# Patient Record
Sex: Female | Born: 1995 | Race: Black or African American | Hispanic: No | Marital: Single | Smoking: Light tobacco smoker
Health system: Southern US, Community
[De-identification: ages and names within clinical notes are randomized; demographics above are authoritative.]

## PROBLEM LIST (undated history)

## (undated) DIAGNOSIS — N200 Calculus of kidney: Secondary | ICD-10-CM

## (undated) DIAGNOSIS — R55 Syncope and collapse: Secondary | ICD-10-CM

## (undated) DIAGNOSIS — Z3046 Encounter for surveillance of implantable subdermal contraceptive: Secondary | ICD-10-CM

## (undated) HISTORY — DX: Calculus of kidney: N20.0

## (undated) HISTORY — DX: Syncope and collapse: R55

## (undated) HISTORY — DX: Encounter for surveillance of implantable subdermal contraceptive: Z30.46

---
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
## 2014-01-05 ENCOUNTER — Emergency Department (HOSPITAL_COMMUNITY): Payer: BC Managed Care – PPO

## 2014-01-05 ENCOUNTER — Emergency Department (HOSPITAL_COMMUNITY)
Admission: EM | Admit: 2014-01-05 | Discharge: 2014-01-05 | Disposition: A | Discharge status: Home / Self Care | Payer: BC Managed Care – PPO | Attending: Emergency Medicine | Admitting: Emergency Medicine

## 2014-01-05 ENCOUNTER — Encounter (HOSPITAL_COMMUNITY): Payer: Self-pay | Admitting: Emergency Medicine

## 2014-01-05 DIAGNOSIS — K5900 Constipation, unspecified: Secondary | ICD-10-CM | POA: Diagnosis not present

## 2014-01-05 DIAGNOSIS — Z79899 Other long term (current) drug therapy: Secondary | ICD-10-CM | POA: Diagnosis not present

## 2014-01-05 DIAGNOSIS — J45909 Unspecified asthma, uncomplicated: Secondary | ICD-10-CM | POA: Insufficient documentation

## 2014-01-05 DIAGNOSIS — Z7952 Long term (current) use of systemic steroids: Secondary | ICD-10-CM | POA: Diagnosis not present

## 2014-01-05 DIAGNOSIS — Z8739 Personal history of other diseases of the musculoskeletal system and connective tissue: Secondary | ICD-10-CM | POA: Diagnosis not present

## 2014-01-05 NOTE — Discharge Instructions (Signed)
Magnesium citrate: Drink entire 10 ounce bottle mixed with equal parts Sprite or Gatorade.  Fleets enemas as directed as needed.  Return to the emergency department for severe abdominal pain, bloody stool, high fever, or other new and concerning symptoms.   Constipation Constipation is when a person has fewer than three bowel movements a week, has difficulty having a bowel movement, or has stools that are dry, hard, or larger than normal. As people grow older, constipation is more common. If you try to fix constipation with medicines that make you have a bowel movement (laxatives), the problem may get worse. Long-term laxative use may cause the muscles of the colon to become weak. A low-fiber diet, not taking in enough fluids, and taking certain medicines may make constipation worse.  CAUSES   Certain medicines, such as antidepressants, pain medicine, iron supplements, antacids, and water pills.   Certain diseases, such as diabetes, irritable bowel syndrome (IBS), thyroid disease, or depression.   Not drinking enough water.   Not eating enough fiber-rich foods.   Stress or travel.   Lack of physical activity or exercise.   Ignoring the urge to have a bowel movement.   Using laxatives too much.  SIGNS AND SYMPTOMS   Having fewer than three bowel movements a week.   Straining to have a bowel movement.   Having stools that are hard, dry, or larger than normal.   Feeling full or bloated.   Pain in the lower abdomen.   Not feeling relief after having a bowel movement.  DIAGNOSIS  Your health care provider will take a medical history and perform a physical exam. Further testing may be done for severe constipation. Some tests may include:  A barium enema X-ray to examine your rectum, colon, and, sometimes, your small intestine.   A sigmoidoscopy to examine your lower colon.   A colonoscopy to examine your entire colon. TREATMENT  Treatment will depend on the  severity of your constipation and what is causing it. Some dietary treatments include drinking more fluids and eating more fiber-rich foods. Lifestyle treatments may include regular exercise. If these diet and lifestyle recommendations do not help, your health care provider may recommend taking over-the-counter laxative medicines to help you have bowel movements. Prescription medicines may be prescribed if over-the-counter medicines do not work.  HOME CARE INSTRUCTIONS   Eat foods that have a lot of fiber, such as fruits, vegetables, whole grains, and beans.  Limit foods high in fat and processed sugars, such as french fries, hamburgers, cookies, candies, and soda.   A fiber supplement may be added to your diet if you cannot get enough fiber from foods.   Drink enough fluids to keep your urine clear or pale yellow.   Exercise regularly or as directed by your health care provider.   Go to the restroom when you have the urge to go. Do not hold it.   Only take over-the-counter or prescription medicines as directed by your health care provider. Do not take other medicines for constipation without talking to your health care provider first.  SEEK IMMEDIATE MEDICAL CARE IF:   You have bright red blood in your stool.   Your constipation lasts for more than 4 days or gets worse.   You have abdominal or rectal pain.   You have thin, pencil-like stools.   You have unexplained weight loss. MAKE SURE YOU:   Understand these instructions.  Will watch your condition.  Will get help right away if you are  not doing well or get worse. Document Released: 10/21/2003 Document Revised: 01/27/2013 Document Reviewed: 11/03/2012 [facility] Patient Information 2015 [place], [place]. This information is not intended to replace advice given to you by your health care provider. Make sure you discuss any questions you have with your health care provider.

## 2014-01-05 NOTE — ED Provider Notes (Signed)
CSN: [id number]     Arrival date & time 01/05/14  [id number] History  This chart was scribed for Jacqueline Koch Jacqueline Bier, MD by Bronson CurbJacqueline Melvin, ED Scribe. This patient was seen in room APA19/APA19 and the patient's care was started at 7:30 PM.     Chief Complaint  Patient presents with  . Constipation    The history is provided by the patient. No language interpreter was used.     HPI Comments: Jacqueline Koch is a 18 y.o. female who presents to the Emergency Department complaining of constipation for the past 6 days. She states she feels as though she has to go, but is unable to. There is associated generalized abdominal pain. Patient reports BM's every other day at baseline. She has not taken anything for symptom relief. Patient denies nausea, vomiting, or history of abdominal surgeries. Patient states she does not have periods due to an implant in her arm (Implanon). She reports she is otherwise healthy, and has no history of significant medical conditions.     History reviewed. No pertinent past medical history. History reviewed. No pertinent past surgical history. Family History  Problem Relation Age of Onset  . Arthritis    . Asthma     History  Substance Use Topics  . Smoking status: Passive Smoke Exposure - Never Smoker  . Smokeless tobacco: Never Used  . Alcohol Use: No   OB History    No data available     Review of Systems  A complete 10 system review of systems was obtained and all systems are negative except as noted in the HPI and PMH.    Allergies  Review of patient's allergies indicates no known allergies.  Home Medications   Prior to Admission medications   Medication Sig Start Date End Date Taking? Authorizing Provider  artificial tears (LACRILUBE) OINT ophthalmic ointment Place into the left eye once. 09/28/13   Arnaldo NatalJack Flippo, MD  etonogestrel (IMPLANON) 68 MG IMPL implant Inject 1 each into the skin once.    Historical Provider, MD  loperamide (IMODIUM) 2 MG capsule Take  two tabs po initially, then one tab after each loose stool: max 8 tabs in 24 hours 08/25/13   Hurman HornJohn M Bednar, MD  metoCLOPramide (REGLAN) 10 MG tablet Take 1 tablet (10 mg total) by mouth every 6 (six) hours as needed for nausea (nausea/headache). 08/25/13   Hurman HornJohn M Bednar, MD  ondansetron (ZOFRAN ODT) 8 MG disintegrating tablet 8mg  ODT q4 hours prn nausea 08/25/13   Hurman HornJohn M Bednar, MD  predniSONE (DELTASONE) 20 MG tablet Take 3 tablets (60 mg total) by mouth daily with breakfast. For 7 days 09/28/13   Arnaldo NatalJack Flippo, MD  valACYclovir (VALTREX) 1000 MG tablet Take 1 tablet (1,000 mg total) by mouth 3 (three) times daily. For 7 days 09/28/13   Arnaldo NatalJack Flippo, MD   Triage Vitals: BP 115/76 mmHg  Pulse 100  Temp(Src) 98.1 F (36.7 C) (Oral)  Resp 18  Ht 4\' 11"  (1.499 m)  Wt 119 lb (53.978 kg)  BMI 24.02 kg/m2  SpO2 100%  Physical Exam  Constitutional: She is oriented to person, place, and time. She appears well-developed and well-nourished. No distress.  HENT:  Head: Normocephalic and atraumatic.  Eyes: Conjunctivae and EOM are normal.  Neck: Neck supple. No tracheal deviation present.  Cardiovascular: Normal rate, regular rhythm and normal heart sounds.   Pulmonary/Chest: Effort normal and breath sounds normal. No respiratory distress.  Abdominal: Soft. Bowel sounds are normal. She exhibits no distension.  There is no tenderness. There is no rebound and no guarding.  Musculoskeletal: Normal range of motion.  Neurological: She is alert and oriented to person, place, and time.  Skin: Skin is warm and dry.  Psychiatric: She has a normal mood and affect. Her behavior is normal.  Nursing note and vitals reviewed.   ED Course  Procedures (including critical care time)  DIAGNOSTIC STUDIES: Oxygen Saturation is 100% on room air, normal by my interpretation.    COORDINATION OF CARE: At 1934 Discussed treatment plan with patient which includes labs and imaging. Patient agrees.   Labs Review Labs  Reviewed - No data to display  Imaging Review Dg Abd 1 View  01/05/2014   CLINICAL DATA:  Constipation. The patient reports he has not had bowel movement for 6 days.  EXAM: ABDOMEN - 1 VIEW  COMPARISON:  None.  FINDINGS: Prominent stool is seen in the ascending and rectosigmoid colon. There is no evidence of bowel obstruction. No abnormal abdominal calcification or focal bony abnormality is identified.  IMPRESSION: Prominent stool burden ascending and rectosigmoid colon.   Electronically Signed   By: Drusilla Kannerhomas  Dalessio M.D.   On: 01/05/2014 20:21     EKG Interpretation None      MDM   Final diagnoses:  None    Patient presents with complaints of constipation and no bowel movement in the past 6 days. KUB reveals stool in the sigmoid and descending colon. I will recommend magnesium citrate and fleets enemas. She is to return if she experiences any problems. Her abdominal exam is benign and KUB shows no evidence for bowel obstruction.  I personally performed the services described in this documentation, which was scribed in my presence. The recorded information has been reviewed and is accurate.      Jacqueline Koch Damari Suastegui, MD 01/05/14 2032

## 2014-01-05 NOTE — ED Notes (Signed)
Patient reports constipation since Wednesday 12/30/13. States last normal bowel movement was before Thanksgiving. Reports upper left quadrant abdominal pain. Denies nausea, vomiting.

## 2014-06-09 ENCOUNTER — Ambulatory Visit (INDEPENDENT_AMBULATORY_CARE_PROVIDER_SITE_OTHER): Payer: Self-pay | Admitting: Adult Health

## 2014-06-09 ENCOUNTER — Encounter: Payer: Self-pay | Admitting: Adult Health

## 2014-06-09 VITALS — BP 108/68 | HR 96 | Ht 59.0 in | Wt 109.0 lb

## 2014-06-09 DIAGNOSIS — Z3046 Encounter for surveillance of implantable subdermal contraceptive: Secondary | ICD-10-CM | POA: Insufficient documentation

## 2014-06-09 DIAGNOSIS — Z308 Encounter for other contraceptive management: Secondary | ICD-10-CM

## 2014-06-09 HISTORY — DX: Encounter for surveillance of implantable subdermal contraceptive: Z30.46

## 2014-06-09 NOTE — Progress Notes (Signed)
Subjective:     Patient ID: Jacqueline Koch RolesXavier L Pavek, female   DOB: [birth year], 19 y.o.   MRN: [id number]  HPI Jess BartersXavier is a 19 year old black female in for nexplanon removal.She says it was due to come out in February.No complaints.  Review of Systems  Patient denies any headaches, hearing loss, fatigue, blurred vision, shortness of breath, chest pain, abdominal pain, problems with bowel movements, urination, or intercourse. No joint pain or mood swings. Reviewed past medical,surgical, social and family history. Reviewed medications and allergies.     Objective:   Physical Exam BP 108/68 mmHg  Pulse 96  Ht 4\' 11"  (1.499 m)  Wt 109 lb (49.442 kg)  BMI 22.00 kg/m2Consent signed, time out called.  Left arm cleansed with betadine, and injected with 1.5 cc 2% lidocaine and waited til numb.Under sterile technique a #11 blade was used to make small vertical incision, and a curved forceps was used to easily remove rod. Steri strips applied. Pressure dressing applied. Will use condoms for now.  Assessment:     Nexplanon removal    Plan:     Use condoms, keep clean and dry x 24 hours, no heavy lifting, keep steri strips on x 72 hours, Keep pressure dressing on x 24 hours. Follow up prn problems.   Pap at 21

## 2014-06-09 NOTE — Patient Instructions (Signed)
Use condoms, keep clean and dry x 24 hours, no heavy lifting, keep steri strips on x 72 hours, Keep pressure dressing on x 24 hours. Follow up prn problems. Pap at 21  

## 2014-11-10 ENCOUNTER — Ambulatory Visit (INDEPENDENT_AMBULATORY_CARE_PROVIDER_SITE_OTHER): Payer: Self-pay | Admitting: Adult Health

## 2014-11-10 ENCOUNTER — Encounter: Payer: Self-pay | Admitting: Adult Health

## 2014-11-10 VITALS — BP 98/52 | HR 96 | Ht <= 58 in | Wt 109.0 lb

## 2014-11-10 DIAGNOSIS — Z3201 Encounter for pregnancy test, result positive: Secondary | ICD-10-CM

## 2014-11-10 DIAGNOSIS — O3680X0 Pregnancy with inconclusive fetal viability, not applicable or unspecified: Secondary | ICD-10-CM

## 2014-11-10 DIAGNOSIS — Z3490 Encounter for supervision of normal pregnancy, unspecified, unspecified trimester: Secondary | ICD-10-CM

## 2014-11-10 LAB — POCT URINE PREGNANCY: PREG TEST UR: POSITIVE — AB

## 2014-11-10 NOTE — Patient Instructions (Signed)
First Trimester of Pregnancy The first trimester of pregnancy is from week 1 until the end of week 12 (months 1 through 3). A week after a sperm fertilizes an egg, the egg will implant on the wall of the uterus. This embryo will begin to develop into a baby. Genes from you and your partner are forming the baby. The female genes determine whether the baby is a boy or a girl. At 6-8 weeks, the eyes and face are formed, and the heartbeat can be seen on ultrasound. At the end of 12 weeks, all the baby's organs are formed.  Now that you are pregnant, you will want to do everything you can to have a healthy baby. Two of the most important things are to get good prenatal care and to follow your health care provider's instructions. Prenatal care is all the medical care you receive before the baby's birth. This care will help prevent, find, and treat any problems during the pregnancy and childbirth. BODY CHANGES Your body goes through many changes during pregnancy. The changes vary from woman to woman.   You may gain or lose a couple of pounds at first.  You may feel sick to your stomach (nauseous) and throw up (vomit). If the vomiting is uncontrollable, call your health care provider.  You may tire easily.  You may develop headaches that can be relieved by medicines approved by your health care provider.  You may urinate more often. Painful urination may mean you have a bladder infection.  You may develop heartburn as a result of your pregnancy.  You may develop constipation because certain hormones are causing the muscles that push waste through your intestines to slow down.  You may develop hemorrhoids or swollen, bulging veins (varicose veins).  Your breasts may begin to grow larger and become tender. Your nipples may stick out more, and the tissue that surrounds them (areola) may become darker.  Your gums may bleed and may be sensitive to brushing and flossing.  Dark spots or blotches (chloasma,  mask of pregnancy) may develop on your face. This will likely fade after the baby is born.  Your menstrual periods will stop.  You may have a loss of appetite.  You may develop cravings for certain kinds of food.  You may have changes in your emotions from day to day, such as being excited to be pregnant or being concerned that something may go wrong with the pregnancy and baby.  You may have more vivid and strange dreams.  You may have changes in your hair. These can include thickening of your hair, rapid growth, and changes in texture. Some women also have hair loss during or after pregnancy, or hair that feels dry or thin. Your hair will most likely return to normal after your baby is born. WHAT TO EXPECT AT YOUR PRENATAL VISITS During a routine prenatal visit:  You will be weighed to make sure you and the baby are growing normally.  Your blood pressure will be taken.  Your abdomen will be measured to track your baby's growth.  The fetal heartbeat will be listened to starting around week 10 or 12 of your pregnancy.  Test results from any previous visits will be discussed. Your health care provider may ask you:  How you are feeling.  If you are feeling the baby move.  If you have had any abnormal symptoms, such as leaking fluid, bleeding, severe headaches, or abdominal cramping.  If you are using any tobacco products,   including cigarettes, chewing tobacco, and electronic cigarettes.  If you have any questions. Other tests that may be performed during your first trimester include:  Blood tests to find your blood type and to check for the presence of any previous infections. They will also be used to check for low iron levels (anemia) and Rh antibodies. Later in the pregnancy, blood tests for diabetes will be done along with other tests if problems develop.  Urine tests to check for infections, diabetes, or protein in the urine.  An ultrasound to confirm the proper growth  and development of the baby.  An amniocentesis to check for possible genetic problems.  Fetal screens for spina bifida and Down syndrome.  You may need other tests to make sure you and the baby are doing well.  HIV (human immunodeficiency virus) testing. Routine prenatal testing includes screening for HIV, unless you choose not to have this test. HOME CARE INSTRUCTIONS  Medicines  Follow your health care provider's instructions regarding medicine use. Specific medicines may be either safe or unsafe to take during pregnancy.  Take your prenatal vitamins as directed.  If you develop constipation, try taking a stool softener if your health care provider approves. Diet  Eat regular, well-balanced meals. Choose a variety of foods, such as meat or vegetable-based protein, fish, milk and low-fat dairy products, vegetables, fruits, and whole grain breads and cereals. Your health care provider will help you determine the amount of weight gain that is right for you.  Avoid raw meat and uncooked cheese. These carry germs that can cause birth defects in the baby.  Eating four or five small meals rather than three large meals a day may help relieve nausea and vomiting. If you start to feel nauseous, eating a few soda crackers can be helpful. Drinking liquids between meals instead of during meals also seems to help nausea and vomiting.  If you develop constipation, eat more high-fiber foods, such as fresh vegetables or fruit and whole grains. Drink enough fluids to keep your urine clear or pale yellow. Activity and Exercise  Exercise only as directed by your health care provider. Exercising will help you:  Control your weight.  Stay in shape.  Be prepared for labor and delivery.  Experiencing pain or cramping in the lower abdomen or low back is a good sign that you should stop exercising. Check with your health care provider before continuing normal exercises.  Try to avoid standing for long  periods of time. Move your legs often if you must stand in one place for a long time.  Avoid heavy lifting.  Wear low-heeled shoes, and practice good posture.  You may continue to have sex unless your health care provider directs you otherwise. Relief of Pain or Discomfort  Wear a good support bra for breast tenderness.   Take warm sitz baths to soothe any pain or discomfort caused by hemorrhoids. Use hemorrhoid cream if your health care provider approves.   Rest with your legs elevated if you have leg cramps or low back pain.  If you develop varicose veins in your legs, wear support hose. Elevate your feet for 15 minutes, 3-4 times a day. Limit salt in your diet. Prenatal Care  Schedule your prenatal visits by the twelfth week of pregnancy. They are usually scheduled monthly at first, then more often in the last 2 months before delivery.  Write down your questions. Take them to your prenatal visits.  Keep all your prenatal visits as directed by your   health care provider. Safety  Wear your seat belt at all times when driving.  Make a list of emergency phone numbers, including numbers for family, friends, the hospital, and police and fire departments. General Tips  Ask your health care provider for a referral to a local prenatal education class. Begin classes no later than at the beginning of month 6 of your pregnancy.  Ask for help if you have counseling or nutritional needs during pregnancy. Your health care provider can offer advice or refer you to specialists for help with various needs.  Do not use hot tubs, steam rooms, or saunas.  Do not douche or use tampons or scented sanitary pads.  Do not cross your legs for long periods of time.  Avoid cat litter boxes and soil used by cats. These carry germs that can cause birth defects in the baby and possibly loss of the fetus by miscarriage or stillbirth.  Avoid all smoking, herbs, alcohol, and medicines not prescribed by  your health care provider. Chemicals in these affect the formation and growth of the baby.  Do not use any tobacco products, including cigarettes, chewing tobacco, and electronic cigarettes. If you need help quitting, ask your health care provider. You may receive counseling support and other resources to help you quit.  Schedule a dentist appointment. At home, brush your teeth with a soft toothbrush and be gentle when you floss. SEEK MEDICAL CARE IF:   You have dizziness.  You have mild pelvic cramps, pelvic pressure, or nagging pain in the abdominal area.  You have persistent nausea, vomiting, or diarrhea.  You have a bad smelling vaginal discharge.  You have pain with urination.  You notice increased swelling in your face, hands, legs, or ankles. SEEK IMMEDIATE MEDICAL CARE IF:   You have a fever.  You are leaking fluid from your vagina.  You have spotting or bleeding from your vagina.  You have severe abdominal cramping or pain.  You have rapid weight gain or loss.  You vomit blood or material that looks like coffee grounds.  You are exposed to [place] measles and have never had them.  You are exposed to fifth disease or chickenpox.  You develop a severe headache.  You have shortness of breath.  You have any kind of trauma, such as from a fall or a car accident.   This information is not intended to replace advice given to you by your health care provider. Make sure you discuss any questions you have with your health care provider.   Document Released: 01/16/2001 Document Revised: 02/12/2014 Document Reviewed: 12/02/2012 Elsevier Interactive Patient Education [employer] Return in 2 weeks for [place] Take 2 flintstones with iron

## 2014-11-10 NOTE — Progress Notes (Signed)
Subjective:     Patient ID: Jacqueline Koch, female   DOB: 1996-01-07, 19 y.o.   MRN: [id number]  HPI Jacqueline Koch is a 19 year old black female in for UPT, she has just missed a period and has some cramping,no bleeding, she had +HPT.  Review of Systems Patient denies any headaches, hearing loss, fatigue, blurred vision, shortness of breath, chest pain, problems with bowel movements, urination, or intercourse. No joint pain or mood swings. See HPI for positives. Reviewed past medical,surgical, social and family history. Reviewed medications and allergies.     Objective:   Physical Exam BP 98/52 mmHg  Pulse 96  Ht  (1.473 m)  Wt 109 lb (49.442 kg)  BMI 22.79 kg/m2  LMP 10/10/2014 UPT +, about 4+3 weeks by LMP with EDD 07/17/15, medicaid form given ,Skin warm and dry. Lungs: clear to ausculation bilaterally. Cardiovascular: regular rate and rhythm.Abdomen soft, non tender.    Assessment:     Pregnant     Plan:     Return in 2 week for dating [place] Take flintstones with iron 2 daily Review handout on first trimester

## 2014-11-24 ENCOUNTER — Other Ambulatory Visit: Payer: Self-pay

## 2014-11-24 ENCOUNTER — Ambulatory Visit (INDEPENDENT_AMBULATORY_CARE_PROVIDER_SITE_OTHER): Payer: Medicaid Other

## 2014-11-24 ENCOUNTER — Telehealth: Payer: Self-pay | Admitting: Adult Health

## 2014-11-24 DIAGNOSIS — O3680X0 Pregnancy with inconclusive fetal viability, not applicable or unspecified: Secondary | ICD-10-CM

## 2014-11-24 MED ORDER — CITRANATAL 90 DHA 90-1 & 300 MG PO MISC: ORAL | Status: DC

## 2014-11-24 NOTE — Progress Notes (Signed)
US 6+3wks single IUP w/ys,pos fht 123 bpm,crl 7.593mm,normal ov's bilat

## 2014-11-24 NOTE — Telephone Encounter (Signed)
Will rx citranatal

## 2014-11-26 ENCOUNTER — Telehealth: Payer: Self-pay | Admitting: *Deleted

## 2014-11-26 NOTE — Telephone Encounter (Signed)
Pt c/o brownish discharge early pregnancy, no bright red blood, u/s on 11/24/2014 + FHT. Pt informed common to have brownish discharge early pregnancy, continue to monitor if bright red blood occurs or severe abdominal pain call our office back. Pt has her new ob appt on 12/07/2014. Pt verbalized understanding.

## 2014-11-29 ENCOUNTER — Telehealth: Payer: Self-pay | Admitting: Women's Health

## 2014-11-29 NOTE — Telephone Encounter (Signed)
Pt c/o odor to urine, urinary frequency.

## 2014-11-30 ENCOUNTER — Ambulatory Visit (INDEPENDENT_AMBULATORY_CARE_PROVIDER_SITE_OTHER): Payer: Medicaid Other | Admitting: Women's Health

## 2014-11-30 ENCOUNTER — Encounter: Payer: Self-pay | Admitting: Women's Health

## 2014-11-30 ENCOUNTER — Other Ambulatory Visit: Payer: Self-pay | Admitting: Women's Health

## 2014-11-30 VITALS — BP 92/60 | HR 60 | Wt 107.0 lb

## 2014-11-30 DIAGNOSIS — R300 Dysuria: Secondary | ICD-10-CM

## 2014-11-30 DIAGNOSIS — O2341 Unspecified infection of urinary tract in pregnancy, first trimester: Secondary | ICD-10-CM

## 2014-11-30 LAB — POCT URINALYSIS DIPSTICK
Glucose, UA: NEGATIVE
Ketones, UA: NEGATIVE
Leukocytes, UA: NEGATIVE
Nitrite, UA: NEGATIVE

## 2014-11-30 MED ORDER — CEPHALEXIN 500 MG PO CAPS: 500.0000 mg | ORAL_CAPSULE | Freq: Four times a day (QID) | ORAL | Status: DC

## 2014-11-30 NOTE — Patient Instructions (Signed)
Pregnancy and Urinary Tract Infection  A urinary tract infection (UTI) is a bacterial infection of the urinary tract. Infection of the urinary tract can include the ureters, kidneys (pyelonephritis), bladder (cystitis), and urethra (urethritis). All pregnant women should be screened for bacteria in the urinary tract. Identifying and treating a UTI will decrease the risk of preterm labor and developing more serious infections in both the mother and baby.  CAUSES  Bacteria germs cause almost all UTIs.   RISK FACTORS  Many factors can increase your chances of getting a UTI during pregnancy. These include:  · Having a short urethra.  · Poor toilet and hygiene habits.  · Sexual intercourse.  · Blockage of urine along the urinary tract.  · Problems with the pelvic muscles or nerves.  · Diabetes.  · Obesity.  · Bladder problems after having several children.  · Previous history of UTI.  SIGNS AND SYMPTOMS   · Pain, burning, or a stinging feeling when urinating.  · Suddenly feeling the need to urinate right away (urgency).  · Loss of bladder control (urinary incontinence).  · Frequent urination, more than is common with pregnancy.  · Lower abdominal or back discomfort.  · Cloudy urine.  · Blood in the urine (hematuria).  · Fever.   When the kidneys are infected, the symptoms may be:  · Back pain.  · Flank pain on the right side more so than the left.  · Fever.  · Chills.  · Nausea.  · Vomiting.  DIAGNOSIS   A urinary tract infection is usually diagnosed through urine tests. Additional tests and procedures are sometimes done. These may include:  · Ultrasound exam of the kidneys, ureters, bladder, and urethra.  · Looking in the bladder with a lighted tube (cystoscopy).  TREATMENT  Typically, UTIs can be treated with antibiotic medicines.   HOME CARE INSTRUCTIONS   · Only take over-the-counter or prescription medicines as directed by your health care provider. If you were prescribed antibiotics, take them as directed. Finish  them even if you start to feel better.  · Drink enough fluids to keep your urine clear or pale yellow.  · Do not have sexual intercourse until the infection is gone and your health care provider says it is okay.  · Make sure you are tested for UTIs throughout your pregnancy. These infections often come back.   Preventing a UTI in the Future  · Practice good toilet habits. Always wipe from front to back. Use the tissue only once.  · Do not hold your urine. Empty your bladder as soon as possible when the urge comes.  · Do not douche or use deodorant sprays.  · Wash with soap and warm water around the genital area and the anus.  · Empty your bladder before and after sexual intercourse.  · Wear underwear with a cotton crotch.  · Avoid caffeine and carbonated drinks. They can irritate the bladder.  · Drink cranberry juice or take cranberry pills. This may decrease the risk of getting a UTI.  · Do not drink alcohol.  · Keep all your appointments and tests as scheduled.   SEEK MEDICAL CARE IF:   · Your symptoms get worse.  · You are still having fevers 2 or more days after treatment begins.  · You have a rash.  · You feel that you are having problems with medicines prescribed.  · You have abnormal vaginal discharge.  SEEK IMMEDIATE MEDICAL CARE IF:   · You have back or flank   pain.  · You have chills.  · You have blood in your urine.  · You have nausea and vomiting.  · You have contractions of your uterus.  · You have a gush of fluid from the vagina.  MAKE SURE YOU:  · Understand these instructions.    · Will watch your condition.    · Will get help right away if you are not doing well or get worse.       This information is not intended to replace advice given to you by your health care provider. Make sure you discuss any questions you have with your health care provider.     Document Released: 05/19/2010 Document Revised: 11/12/2012 Document Reviewed: 08/21/2012  Elsevier Interactive Patient Education ©2016 Elsevier  Inc[employer]

## 2014-11-30 NOTE — Progress Notes (Signed)
Patient ID: Jacqueline Koch, female   DOB: 06/11/1995, 19 y.o.   MRN: [id number]   [facility]  Patient name: Jacqueline Koch MRN [id number]  Date of birth: 06/11/1995  CC & HPI:  Jacqueline Koch is a 19 y.o. G1P0000 African American female at 5436w2d presenting today for report of very strong odor to urine x 5 days, urge to go frequently but only a couple of drops come out. Hasn't had new ob visit yet. Requests note for work stating she's pregnant.  Denies flank pain/fever/chills.  Patient's last menstrual period was 10/10/2014 (exact date).  Pertinent History Reviewed:  Medical & Surgical Hx:   Past Medical History  Diagnosis Date  . Nexplanon removal 06/09/2014  . Pregnant 11/10/2014   History reviewed. No pertinent past surgical history. Medications: Reviewed & Updated - see associated section Social History: Reviewed -  reports that she has quit smoking. Her smoking use included Cigarettes. She quit after 1 year of use. She has never used smokeless tobacco.  Objective Findings:  Vitals: BP 92/60 mmHg  Pulse 60  Wt 107 lb (48.535 kg)  LMP 10/10/2014 (Exact Date) Body mass index is 22.37 kg/(m^2).  Physical Examination: General appearance - alert, well appearing, and in no distress  Results for orders placed or performed in visit on 11/30/14 (from the past 24 hour(s))  POCT urinalysis dipstick   Collection Time: 11/30/14  2:03 PM  Result Value Ref Range   Color, UA     Clarity, UA     Glucose, UA neg    Bilirubin, UA     Ketones, UA neg    Spec Grav, UA     Blood, UA moderate    pH, UA     Protein, UA trace    Urobilinogen, UA     Nitrite, UA neg    Leukocytes, UA Negative Negative     Assessment & Plan:  A:   10436w2d pregnant  Presumptive UTI based on sx  P:  Rx keflex qid x 7d  Send urine cx  Return for As scheduled. 11/1 for new ob appt  Marge DuncansBooker, Aurelius Gildersleeve Randall CNM, [facility] MsWHNP-BC 11/30/2014 2:21 PM

## 2014-12-02 LAB — URINE CULTURE

## 2014-12-06 ENCOUNTER — Encounter: Payer: Self-pay | Admitting: Women's Health

## 2014-12-06 ENCOUNTER — Telehealth: Payer: Self-pay | Admitting: Women's Health

## 2014-12-06 DIAGNOSIS — O2341 Unspecified infection of urinary tract in pregnancy, first trimester: Secondary | ICD-10-CM | POA: Insufficient documentation

## 2014-12-06 NOTE — Telephone Encounter (Signed)
error 

## 2014-12-07 ENCOUNTER — Ambulatory Visit (INDEPENDENT_AMBULATORY_CARE_PROVIDER_SITE_OTHER): Payer: Medicaid Other | Admitting: Women's Health

## 2014-12-07 ENCOUNTER — Encounter: Payer: Self-pay | Admitting: Women's Health

## 2014-12-07 VITALS — BP 100/60 | HR 68 | Wt 107.0 lb

## 2014-12-07 DIAGNOSIS — Z3682 Encounter for antenatal screening for nuchal translucency: Secondary | ICD-10-CM

## 2014-12-07 DIAGNOSIS — Z3400 Encounter for supervision of normal first pregnancy, unspecified trimester: Secondary | ICD-10-CM | POA: Insufficient documentation

## 2014-12-07 DIAGNOSIS — Z3401 Encounter for supervision of normal first pregnancy, first trimester: Secondary | ICD-10-CM

## 2014-12-07 DIAGNOSIS — Z369 Encounter for antenatal screening, unspecified: Secondary | ICD-10-CM

## 2014-12-07 DIAGNOSIS — Z331 Pregnant state, incidental: Secondary | ICD-10-CM

## 2014-12-07 DIAGNOSIS — Z1389 Encounter for screening for other disorder: Secondary | ICD-10-CM

## 2014-12-07 LAB — POCT URINALYSIS DIPSTICK
Blood, UA: NEGATIVE
Glucose, UA: NEGATIVE
Ketones, UA: NEGATIVE
Leukocytes, UA: NEGATIVE
Nitrite, UA: NEGATIVE
PROTEIN UA: NEGATIVE

## 2014-12-07 MED ORDER — DOXYLAMINE-PYRIDOXINE 10-10 MG PO TBEC: DELAYED_RELEASE_TABLET | ORAL | Status: DC

## 2014-12-07 NOTE — Patient Instructions (Signed)

## 2014-12-07 NOTE — Progress Notes (Signed)
Pt denies any problems or concerns at this time. Pt given CCNC form and lab consents to read over and sign.  

## 2014-12-07 NOTE — Progress Notes (Signed)
  Subjective:  Johna RolesXavier L Blaize is a 19 y.o. G1P0000 African American female at 5467w2d by LMP c/w 6wk u/s, being seen today for her first obstetrical visit.  Her obstetrical history is significant for primigravida, previous smoker quit w/ +PT.  Pregnancy history fully reviewed.  Patient reports n/v- requests meds. Denies vb, cramping, uti s/s, abnormal/malodorous vag d/c, or vulvovaginal itching/irritation.  BP 100/60 mmHg  Pulse 68  Wt 107 lb (48.535 kg)  LMP 10/10/2014 (Exact Date)  HISTORY: OB History  Gravida Para Term Preterm AB SAB TAB Ectopic Multiple Living  1 0 0 0 0 0 0 0 0 0     # Outcome Date GA Lbr Len/2nd Weight Sex Delivery Anes PTL Lv  1 Current              Past Medical History  Diagnosis Date  . Nexplanon removal 06/09/2014  . Pregnant 11/10/2014   History reviewed. No pertinent past surgical history. Family History  Problem Relation Age of Onset  . Asthma Brother   . Arthritis Paternal Grandmother   . Crohn's disease Maternal Grandmother     Exam   System:     General: Well developed & nourished, no acute distress   Skin: Warm & dry, normal coloration and turgor, no rashes   Neurologic: Alert & oriented, normal mood   Cardiovascular: Regular rate & rhythm   Respiratory: Effort & rate normal, LCTAB, acyanotic   Abdomen: Soft, non tender   Extremities: normal strength, tone  Thin prep pap smear <21yo FHR: 150s via informal us   Assessment:   Pregnancy: G1P0000 Patient Active Problem List   Diagnosis Date Noted  . Supervision of normal first pregnancy 12/07/2014    Priority: High  . UTI (urinary tract infection) in pregnancy in first trimester 12/06/2014    Priority: High  . Nexplanon removal 06/09/2014  . Enterococcus UTI 01/20/2013  . Chlamydia infection 01/20/2013  . Vaginal discharge 12/19/2012  . Adolescent scoliosis 12/01/2012  . Abnormal vision screen 12/01/2012  . Hearing deficit 12/01/2012  . Scoliosis 11/02/2010  . Back pain 11/02/2010     4367w2d G1P0000 New OB visit N/V pregnancy  Plan:  Initial labs drawn Continue prenatal vitamins Problem list reviewed and updated Reviewed n/v relief measures and warning s/s to report Rx diclegis, prior auth approved through [employer] today, and 0 samples given.  Reviewed recommended weight gain based on pre-gravid BMI Encouraged well-balanced diet Genetic Screening discussed Integrated Screen: requested Cystic fibrosis screening discussed declined Ultrasound discussed; fetal survey: requested Follow up in 4 weeks for 1st it/nt and visit CCNC completed NFPartnership offered, accepted, referral faxed   Marge DuncansBooker, Danen Lapaglia Randall CNM, [facility] 12/07/2014 12:21 PM

## 2014-12-08 ENCOUNTER — Encounter: Payer: Self-pay | Admitting: Women's Health

## 2014-12-08 DIAGNOSIS — O26899 Other specified pregnancy related conditions, unspecified trimester: Secondary | ICD-10-CM

## 2014-12-08 DIAGNOSIS — Z6791 Unspecified blood type, Rh negative: Secondary | ICD-10-CM | POA: Insufficient documentation

## 2014-12-08 DIAGNOSIS — F1290 Cannabis use, unspecified, uncomplicated: Secondary | ICD-10-CM | POA: Insufficient documentation

## 2014-12-08 LAB — GC/CHLAMYDIA PROBE AMP
CHLAMYDIA, DNA PROBE: NEGATIVE
NEISSERIA GONORRHOEAE BY PCR: NEGATIVE

## 2014-12-09 LAB — CBC
HEMATOCRIT: 33.6 % — AB (ref 34.0–46.6)
Hemoglobin: 11 g/dL — ABNORMAL LOW (ref 11.1–15.9)
MCH: 31.2 pg (ref 26.6–33.0)
MCHC: 32.7 g/dL (ref 31.5–35.7)
MCV: 95 fL (ref 79–97)
Platelets: 175 10*3/uL (ref 150–379)
RBC: 3.53 x10E6/uL — ABNORMAL LOW (ref 3.77–5.28)
RDW: 13.1 % (ref 12.3–15.4)
WBC: 4 10*3/uL (ref 3.4–10.8)

## 2014-12-09 LAB — URINALYSIS, ROUTINE W REFLEX MICROSCOPIC
Bilirubin, UA: NEGATIVE
Glucose, UA: NEGATIVE
Ketones, UA: NEGATIVE
Leukocytes, UA: NEGATIVE
Nitrite, UA: NEGATIVE
PH UA: 8 — AB (ref 5.0–7.5)
RBC UA: NEGATIVE
Specific Gravity, UA: 1.025 (ref 1.005–1.030)
Urobilinogen, Ur: 0.2 mg/dL (ref 0.2–1.0)

## 2014-12-09 LAB — ANTIBODY SCREEN: Antibody Screen: NEGATIVE

## 2014-12-09 LAB — PMP SCREEN PROFILE (10S), URINE
Amphetamine Screen, Ur: NEGATIVE ng/mL
Barbiturate Screen, Ur: NEGATIVE ng/mL
Benzodiazepine Screen, Urine: NEGATIVE ng/mL
Cannabinoids Ur Ql Scn: POSITIVE ng/mL
Cocaine(Metab.)Screen, Urine: NEGATIVE ng/mL
Creatinine(Crt), U: 179.8 mg/dL (ref 20.0–300.0)
Methadone Scn, Ur: NEGATIVE ng/mL
Opiate Scrn, Ur: NEGATIVE ng/mL
Oxycodone+Oxymorphone Ur Ql Scn: NEGATIVE ng/mL
PCP Scrn, Ur: NEGATIVE ng/mL
Ph of Urine: 7.9 (ref 4.5–8.9)
Propoxyphene, Screen: NEGATIVE ng/mL

## 2014-12-09 LAB — RUBELLA SCREEN: Rubella Antibodies, IGG: 2.95 index (ref >0.99)

## 2014-12-09 LAB — HIV ANTIBODY (ROUTINE TESTING W REFLEX): HIV Screen 4th Generation wRfx: NONREACTIVE

## 2014-12-09 LAB — RPR: RPR Ser Ql: NONREACTIVE

## 2014-12-09 LAB — VARICELLA ZOSTER ANTIBODY, IGG: Varicella zoster IgG: <135 index — ABNORMAL LOW (ref >165)

## 2014-12-09 LAB — URINE CULTURE: ORGANISM ID, BACTERIA: NO GROWTH

## 2014-12-09 LAB — HEPATITIS B SURFACE ANTIGEN: HEP B S AG: NEGATIVE

## 2014-12-09 LAB — ABO/RH: Rh Factor: NEGATIVE

## 2014-12-09 LAB — SICKLE CELL SCREEN: Sickle Cell Screen: NEGATIVE

## 2014-12-13 ENCOUNTER — Encounter: Payer: Self-pay | Admitting: Women's Health

## 2014-12-13 DIAGNOSIS — Z283 Underimmunization status: Secondary | ICD-10-CM

## 2014-12-13 DIAGNOSIS — Z2839 Other underimmunization status: Secondary | ICD-10-CM | POA: Insufficient documentation

## 2014-12-13 DIAGNOSIS — O09899 Supervision of other high risk pregnancies, unspecified trimester: Secondary | ICD-10-CM | POA: Insufficient documentation

## 2015-01-04 ENCOUNTER — Ambulatory Visit (INDEPENDENT_AMBULATORY_CARE_PROVIDER_SITE_OTHER): Payer: Medicaid Other | Admitting: Obstetrics & Gynecology

## 2015-01-04 ENCOUNTER — Encounter: Payer: Self-pay | Admitting: Obstetrics & Gynecology

## 2015-01-04 ENCOUNTER — Ambulatory Visit (INDEPENDENT_AMBULATORY_CARE_PROVIDER_SITE_OTHER): Payer: Medicaid Other

## 2015-01-04 VITALS — BP 90/60 | HR 72 | Wt 109.0 lb

## 2015-01-04 DIAGNOSIS — Z1389 Encounter for screening for other disorder: Secondary | ICD-10-CM

## 2015-01-04 DIAGNOSIS — Z331 Pregnant state, incidental: Secondary | ICD-10-CM

## 2015-01-04 DIAGNOSIS — Z3402 Encounter for supervision of normal first pregnancy, second trimester: Secondary | ICD-10-CM

## 2015-01-04 DIAGNOSIS — Z36 Encounter for antenatal screening of mother: Secondary | ICD-10-CM

## 2015-01-04 DIAGNOSIS — Z3A12 12 weeks gestation of pregnancy: Secondary | ICD-10-CM

## 2015-01-04 DIAGNOSIS — Z3682 Encounter for antenatal screening for nuchal translucency: Secondary | ICD-10-CM

## 2015-01-04 LAB — POCT URINALYSIS DIPSTICK
GLUCOSE UA: NEGATIVE
Ketones, UA: NEGATIVE
Leukocytes, UA: NEGATIVE
Nitrite, UA: NEGATIVE
PROTEIN UA: NEGATIVE
RBC UA: NEGATIVE

## 2015-01-04 NOTE — Progress Notes (Signed)
G1P0000 5377w2d Estimated Date of Delivery: 07/17/15  Blood pressure 90/60, pulse 72, weight 109 lb (49.442 kg), last menstrual period 10/10/2014.   BP weight and urine results all reviewed and noted.  Please refer to the obstetrical flow sheet for the fundal height and fetal heart rate documentation:  Patient reports good fetal movement, denies any bleeding and no rupture of membranes symptoms or regular contractions. Patient is without complaints. All questions were answered.  Orders Placed This Encounter  Procedures  . Maternal Screen, Integrated #1  . POCT urinalysis dipstick    Plan:  Continued routine obstetrical care, normal NT sonogram today  Return in about 4 weeks (around 02/01/2015) for LROB.

## 2015-01-04 NOTE — Progress Notes (Addendum)
US 12+2 wks,measurements c/w dates,crl 60.826mm,NB present,NT 1.445mm,normal ov's bilat,fhr 155 bpm,ant pl gr 0

## 2015-01-06 LAB — MATERNAL SCREEN, INTEGRATED #1
Crown Rump Length: 60.6 mm
Gest. Age on Collection Date: 12.4 weeks
Maternal Age at EDD: 20.5 years
NUCHAL TRANSLUCENCY (NT): 1.5 mm
Number of Fetuses: 1
PAPP-A Value: 4055.7 ng/mL
Weight: 109 [lb_av]

## 2015-02-01 ENCOUNTER — Ambulatory Visit (INDEPENDENT_AMBULATORY_CARE_PROVIDER_SITE_OTHER): Payer: Medicaid Other | Admitting: Women's Health

## 2015-02-01 ENCOUNTER — Encounter: Payer: Self-pay | Admitting: Women's Health

## 2015-02-01 VITALS — BP 84/52 | HR 78 | Wt 111.0 lb

## 2015-02-01 DIAGNOSIS — Z363 Encounter for antenatal screening for malformations: Secondary | ICD-10-CM

## 2015-02-01 DIAGNOSIS — Z3A16 16 weeks gestation of pregnancy: Secondary | ICD-10-CM

## 2015-02-01 DIAGNOSIS — Z3402 Encounter for supervision of normal first pregnancy, second trimester: Secondary | ICD-10-CM

## 2015-02-01 DIAGNOSIS — Z369 Encounter for antenatal screening, unspecified: Secondary | ICD-10-CM

## 2015-02-01 DIAGNOSIS — Z331 Pregnant state, incidental: Secondary | ICD-10-CM

## 2015-02-01 DIAGNOSIS — O2341 Unspecified infection of urinary tract in pregnancy, first trimester: Secondary | ICD-10-CM

## 2015-02-01 DIAGNOSIS — Z1389 Encounter for screening for other disorder: Secondary | ICD-10-CM

## 2015-02-01 LAB — POCT URINALYSIS DIPSTICK
Blood, UA: NEGATIVE
GLUCOSE UA: NEGATIVE
Ketones, UA: NEGATIVE
Leukocytes, UA: NEGATIVE
NITRITE UA: NEGATIVE
Protein, UA: NEGATIVE

## 2015-02-01 NOTE — Patient Instructions (Signed)
For Headaches:   Stay well hydrated, drink enough water so that your urine is clear, sometimes if you are dehydrated you can get headaches  Eat small frequent meals and snacks, sometimes if you are hungry you can get headaches  Sometimes you get headaches during pregnancy from the pregnancy hormones  You can try tylenol (1-2 regular strength 325mg or 1-2 extra strength 500mg) as directed on the box. The least amount of medication that works is best.   Cool compresses (cool wet washcloth or ice pack) to area of head that is hurting  You can also try drinking a caffeinated drink to see if this will help  Call us if these things aren't helping your headaches   Second Trimester of Pregnancy The second trimester is from week 13 through week 28, months 4 through 6. The second trimester is often a time when you feel your best. Your body has also adjusted to being pregnant, and you begin to feel better physically. Usually, morning sickness has lessened or quit completely, you may have more energy, and you may have an increase in appetite. The second trimester is also a time when the fetus is growing rapidly. At the end of the sixth month, the fetus is about 9 inches long and weighs about 1 pounds. You will likely begin to feel the baby move (quickening) between 18 and 20 weeks of the pregnancy. BODY CHANGES Your body goes through many changes during pregnancy. The changes vary from woman to woman.   Your weight will continue to increase. You will notice your lower abdomen bulging out.  You may begin to get stretch marks on your hips, abdomen, and breasts.  You may develop headaches that can be relieved by medicines approved by your health care provider.  You may urinate more often because the fetus is pressing on your bladder.  You may develop or continue to have heartburn as a result of your pregnancy.  You may develop constipation because certain hormones are causing the muscles that push  waste through your intestines to slow down.  You may develop hemorrhoids or swollen, bulging veins (varicose veins).  You may have back pain because of the weight gain and pregnancy hormones relaxing your joints between the bones in your pelvis and as a result of a shift in weight and the muscles that support your balance.  Your breasts will continue to grow and be tender.  Your gums may bleed and may be sensitive to brushing and flossing.  Dark spots or blotches (chloasma, mask of pregnancy) may develop on your face. This will likely fade after the baby is born.  A dark line from your belly button to the pubic area (linea nigra) may appear. This will likely fade after the baby is born.  You may have changes in your hair. These can include thickening of your hair, rapid growth, and changes in texture. Some women also have hair loss during or after pregnancy, or hair that feels dry or thin. Your hair will most likely return to normal after your baby is born. WHAT TO EXPECT AT YOUR PRENATAL VISITS During a routine prenatal visit:  You will be weighed to make sure you and the fetus are growing normally.  Your blood pressure will be taken.  Your abdomen will be measured to track your baby's growth.  The fetal heartbeat will be listened to.  Any test results from the previous visit will be discussed. Your health care provider may ask you:  How   you are feeling.  If you are feeling the baby move.  If you have had any abnormal symptoms, such as leaking fluid, bleeding, severe headaches, or abdominal cramping.  If you are using any tobacco products, including cigarettes, chewing tobacco, and electronic cigarettes.  If you have any questions. Other tests that may be performed during your second trimester include:  Blood tests that check for:  Low iron levels (anemia).  Gestational diabetes (between 24 and 28 weeks).  Rh antibodies.  Urine tests to check for infections, diabetes,  or protein in the urine.  An ultrasound to confirm the proper growth and development of the baby.  An amniocentesis to check for possible genetic problems.  Fetal screens for spina bifida and Down syndrome.  HIV (human immunodeficiency virus) testing. Routine prenatal testing includes screening for HIV, unless you choose not to have this test. HOME CARE INSTRUCTIONS   Avoid all smoking, herbs, alcohol, and unprescribed drugs. These chemicals affect the formation and growth of the baby.  Do not use any tobacco products, including cigarettes, chewing tobacco, and electronic cigarettes. If you need help quitting, ask your health care provider. You may receive counseling support and other resources to help you quit.  Follow your health care provider's instructions regarding medicine use. There are medicines that are either safe or unsafe to take during pregnancy.  Exercise only as directed by your health care provider. Experiencing uterine cramps is a good sign to stop exercising.  Continue to eat regular, healthy meals.  Wear a good support bra for breast tenderness.  Do not use hot tubs, steam rooms, or saunas.  Wear your seat belt at all times when driving.  Avoid raw meat, uncooked cheese, cat litter boxes, and soil used by cats. These carry germs that can cause birth defects in the baby.  Take your prenatal vitamins.  Take 1500-2000 mg of calcium daily starting at the 20th week of pregnancy until you deliver your baby.  Try taking a stool softener (if your health care provider approves) if you develop constipation. Eat more high-fiber foods, such as fresh vegetables or fruit and whole grains. Drink plenty of fluids to keep your urine clear or pale yellow.  Take warm sitz baths to soothe any pain or discomfort caused by hemorrhoids. Use hemorrhoid cream if your health care provider approves.  If you develop varicose veins, wear support hose. Elevate your feet for 15 minutes, 3-4  times a day. Limit salt in your diet.  Avoid heavy lifting, wear low heel shoes, and practice good posture.  Rest with your legs elevated if you have leg cramps or low back pain.  Visit your dentist if you have not gone yet during your pregnancy. Use a soft toothbrush to brush your teeth and be gentle when you floss.  A sexual relationship may be continued unless your health care provider directs you otherwise.  Continue to go to all your prenatal visits as directed by your health care provider. SEEK MEDICAL CARE IF:   You have dizziness.  You have mild pelvic cramps, pelvic pressure, or nagging pain in the abdominal area.  You have persistent nausea, vomiting, or diarrhea.  You have a bad smelling vaginal discharge.  You have pain with urination. SEEK IMMEDIATE MEDICAL CARE IF:   You have a fever.  You are leaking fluid from your vagina.  You have spotting or bleeding from your vagina.  You have severe abdominal cramping or pain.  You have rapid weight gain or loss.  You have shortness of breath with chest pain.  You notice sudden or extreme swelling of your face, hands, ankles, feet, or legs.  You have not felt your baby move in over an hour.  You have severe headaches that do not go away with medicine.  You have vision changes.   This information is not intended to replace advice given to you by your health care provider. Make sure you discuss any questions you have with your health care provider.   Document Released: 01/16/2001 Document Revised: 02/12/2014 Document Reviewed: 03/25/2012 Elsevier Interactive Patient Education [employer]

## 2015-02-01 NOTE — Progress Notes (Signed)
Low-risk OB appointment G1P0000 6514w2d Estimated Date of Delivery: 07/17/15 BP 84/52 mmHg  Pulse 78  Wt 111 lb (50.349 kg)  LMP 10/10/2014 (Exact Date)  BP, weight, and urine reviewed.  Refer to obstetrical flow sheet for FH & FHR.  No fm yet. Denies cramping, lof, vb, or uti s/s. Some headaches, hasn't tried anything to help- gave printed info on prevention/relief.  Reviewed warning s/s to report. Plan:  Continue routine obstetrical care  F/U in 4wks for OB appointment and anatomy u/s 2nd IT today

## 2015-02-03 LAB — MATERNAL SCREEN, INTEGRATED #2
AFP MARKER: 61.6 ng/mL
AFP MoM: 1.47
CROWN RUMP LENGTH: 60.6 mm
DIA MoM: 1.58
DIA VALUE: 332.8 pg/mL
Estriol, Unconjugated: 1.36 ng/mL
GESTATIONAL AGE: 16.4 wk
Gest. Age on Collection Date: 12.4 weeks
HCG MOM: 1.5
Maternal Age at EDD: 20.5 years
Nuchal Translucency (NT): 1.5 mm
Nuchal Translucency MoM: 0.97
Number of Fetuses: 1
PAPP-A MOM: 2.96
PAPP-A Value: 4055.7 ng/mL
Test Results:: NEGATIVE
Weight: 109 [lb_av]
Weight: 111 [lb_av]
hCG Value: 56.5 IU/mL
uE3 MoM: 1.37

## 2015-02-06 NOTE — L&D Delivery Note (Signed)
Patient is 20 y.o. G1P0000 9073w0d admitted for SROM   Delivery Note At 5:09 AM a viable female was delivered via Vaginal, Spontaneous Delivery (Presentation: Left Occiput Anterior).  APGAR: 9, 9; weight  .   Placenta status: Intact, Spontaneous.  Cord: 3 vessels with the following complications: none .  Cord pH: n/a  Anesthesia: Epidural  Episiotomy: None Lacerations: None Suture Repair: n/a Est. Blood Loss (mL):  350. Pitocin and cytotec given.  Mom to postpartum.  Baby to Couplet care / Skin to Skin.  Jacqueline Koch 07/17/2015, 5:33 AM

## 2015-03-01 ENCOUNTER — Ambulatory Visit (INDEPENDENT_AMBULATORY_CARE_PROVIDER_SITE_OTHER): Payer: Medicaid Other | Admitting: Advanced Practice Midwife

## 2015-03-01 ENCOUNTER — Ambulatory Visit (INDEPENDENT_AMBULATORY_CARE_PROVIDER_SITE_OTHER): Payer: Medicaid Other

## 2015-03-01 VITALS — BP 100/80 | HR 68 | Wt 114.5 lb

## 2015-03-01 DIAGNOSIS — Z3402 Encounter for supervision of normal first pregnancy, second trimester: Secondary | ICD-10-CM

## 2015-03-01 DIAGNOSIS — Z1389 Encounter for screening for other disorder: Secondary | ICD-10-CM

## 2015-03-01 DIAGNOSIS — Z331 Pregnant state, incidental: Secondary | ICD-10-CM

## 2015-03-01 DIAGNOSIS — Z36 Encounter for antenatal screening of mother: Secondary | ICD-10-CM

## 2015-03-01 DIAGNOSIS — Z363 Encounter for antenatal screening for malformations: Secondary | ICD-10-CM

## 2015-03-01 LAB — POCT URINALYSIS DIPSTICK
Glucose, UA: NEGATIVE
Ketones, UA: NEGATIVE
Leukocytes, UA: NEGATIVE
NITRITE UA: NEGATIVE
RBC UA: NEGATIVE

## 2015-03-01 NOTE — Progress Notes (Signed)
G1P0000 [redacted]w[redacted]d Estimated Date of Delivery: 07/17/15  Blood pressure 100/80, pulse 68, weight 114 lb 8 oz (51.937 kg), last menstrual period 10/10/2014.   BP weight and urine results all reviewed and noted.  Please refer to the obstetrical flow sheet for the fundal height and fetal heart rate documentation: [place] 20+2wks,breech,cx 3.8 cm,ant pl gr 0,normal ov's bilat,fhr 149 bpm,svp of fluid 4cm,efw 341g,limited view of L s/p please have pt come back to ultrasound after appt w/ Drenda Freeze. ,no obvious abn seen           Patient reports good fetal movement, denies any bleeding and no rupture of membranes symptoms or regular contractions. Patient is without complaints. All questions were answered.  Orders Placed This Encounter  Procedures  . POCT urinalysis dipstick    Plan:  Continued routine obstetrical care,   Return in about 4 weeks (around 03/29/2015) for LROB.

## 2015-03-01 NOTE — Progress Notes (Addendum)
[place] 20+2wks,breech,cx 3.8 cm,ant pl gr 0,normal ov's bilat,fhr 149 bpm,svp of fluid 4cm,efw 341g,limited view of L S/P, please have pt come back for additional images ,no obvious abn seen

## 2015-03-29 ENCOUNTER — Ambulatory Visit (INDEPENDENT_AMBULATORY_CARE_PROVIDER_SITE_OTHER): Payer: Medicaid Other | Admitting: Women's Health

## 2015-03-29 ENCOUNTER — Encounter: Payer: Self-pay | Admitting: Women's Health

## 2015-03-29 VITALS — BP 94/56 | HR 64 | Wt 123.0 lb

## 2015-03-29 DIAGNOSIS — F1290 Cannabis use, unspecified, uncomplicated: Secondary | ICD-10-CM

## 2015-03-29 DIAGNOSIS — B373 Candidiasis of vulva and vagina: Secondary | ICD-10-CM | POA: Diagnosis not present

## 2015-03-29 DIAGNOSIS — F1210 Cannabis abuse, uncomplicated: Secondary | ICD-10-CM

## 2015-03-29 DIAGNOSIS — B3731 Acute candidiasis of vulva and vagina: Secondary | ICD-10-CM

## 2015-03-29 DIAGNOSIS — O26892 Other specified pregnancy related conditions, second trimester: Secondary | ICD-10-CM

## 2015-03-29 DIAGNOSIS — Z331 Pregnant state, incidental: Secondary | ICD-10-CM

## 2015-03-29 DIAGNOSIS — Z3402 Encounter for supervision of normal first pregnancy, second trimester: Secondary | ICD-10-CM

## 2015-03-29 DIAGNOSIS — N898 Other specified noninflammatory disorders of vagina: Secondary | ICD-10-CM | POA: Diagnosis not present

## 2015-03-29 DIAGNOSIS — Z363 Encounter for antenatal screening for malformations: Secondary | ICD-10-CM

## 2015-03-29 DIAGNOSIS — Z1389 Encounter for screening for other disorder: Secondary | ICD-10-CM

## 2015-03-29 LAB — POCT WET PREP (WET MOUNT): CLUE CELLS WET PREP WHIFF POC: NEGATIVE

## 2015-03-29 LAB — POCT URINALYSIS DIPSTICK
Blood, UA: NEGATIVE
Glucose, UA: NEGATIVE
KETONES UA: NEGATIVE
Nitrite, UA: NEGATIVE
PROTEIN UA: NEGATIVE

## 2015-03-29 NOTE — Progress Notes (Signed)
Low-risk OB appointment G1P0000 [redacted]w[redacted]d Estimated Date of Delivery: 07/17/15 BP 94/56 mmHg  Pulse 64  Wt 123 lb (55.792 kg)  LMP 10/10/2014 (Exact Date)  BP, weight, and urine reviewed.  Refer to obstetrical flow sheet for FH & FHR.  Reports good fm.  Denies regular uc's, lof, vb, or uti s/s. No THC since beginning of pregnancy- will recheck today. Green vaginal d/c w/ itching x 1wk.  Spec exam: cx visually closed, large amt thick clumpy green nonodorous d/c, wet prep many yeast, to use otc monistat 7 for yeast Reviewed ptl s/s, fm. Gave cb class info Plan:  Continue routine obstetrical care  F/U in 4wks for OB appointment, pn2, and u/s f/u spine anatomy

## 2015-03-29 NOTE — Patient Instructions (Addendum)
Monistat 7 for your yeast infection  You will have your sugar test next visit.  Please do not eat or drink anything after midnight the night before you come, not even water.  You will be here for at least two hours.     Call the office [phone]) or go to [facility] if:  You begin to have strong, frequent contractions  Your water breaks.  Sometimes it is a big gush of fluid, sometimes it is just a trickle that keeps getting your panties wet or running down your legs  You have vaginal bleeding.  It is normal to have a small amount of spotting if your cervix was checked.   You don't feel your baby moving like normal.  If you don't, get you something to eat and drink and lay down and focus on feeling your baby move.   If your baby is still not moving like normal, you should call the office or go to [facility].  Second Trimester of Pregnancy The second trimester is from week 13 through week 28, months 4 through 6. The second trimester is often a time when you feel your best. Your body has also adjusted to being pregnant, and you begin to feel better physically. Usually, morning sickness has lessened or quit completely, you may have more energy, and you may have an increase in appetite. The second trimester is also a time when the fetus is growing rapidly. At the end of the sixth month, the fetus is about 9 inches long and weighs about 1 pounds. You will likely begin to feel the baby move (quickening) between 18 and 20 weeks of the pregnancy. BODY CHANGES Your body goes through many changes during pregnancy. The changes vary from woman to woman.   Your weight will continue to increase. You will notice your lower abdomen bulging out.  You may begin to get stretch marks on your hips, abdomen, and breasts.  You may develop headaches that can be relieved by medicines approved by your health care provider.  You may urinate more often because the fetus is pressing on your bladder.  You  may develop or continue to have heartburn as a result of your pregnancy.  You may develop constipation because certain hormones are causing the muscles that push waste through your intestines to slow down.  You may develop hemorrhoids or swollen, bulging veins (varicose veins).  You may have back pain because of the weight gain and pregnancy hormones relaxing your joints between the bones in your pelvis and as a result of a shift in weight and the muscles that support your balance.  Your breasts will continue to grow and be tender.  Your gums may bleed and may be sensitive to brushing and flossing.  Dark spots or blotches (chloasma, mask of pregnancy) may develop on your face. This will likely fade after the baby is born.  A dark line from your belly button to the pubic area (linea nigra) may appear. This will likely fade after the baby is born.  You may have changes in your hair. These can include thickening of your hair, rapid growth, and changes in texture. Some women also have hair loss during or after pregnancy, or hair that feels dry or thin. Your hair will most likely return to normal after your baby is born. WHAT TO EXPECT AT YOUR PRENATAL VISITS During a routine prenatal visit:  You will be weighed to make sure you and the fetus are growing normally.  Your blood pressure will be taken.  Your abdomen will be measured to track your baby's growth.  The fetal heartbeat will be listened to.  Any test results from the previous visit will be discussed. Your health care provider may ask you:  How you are feeling.  If you are feeling the baby move.  If you have had any abnormal symptoms, such as leaking fluid, bleeding, severe headaches, or abdominal cramping.  If you have any questions. Other tests that may be performed during your second trimester include:  Blood tests that check for:  Low iron levels (anemia).  Gestational diabetes (between 24 and 28 weeks).  Rh  antibodies.  Urine tests to check for infections, diabetes, or protein in the urine.  An ultrasound to confirm the proper growth and development of the baby.  An amniocentesis to check for possible genetic problems.  Fetal screens for spina bifida and Down syndrome. HOME CARE INSTRUCTIONS   Avoid all smoking, herbs, alcohol, and unprescribed drugs. These chemicals affect the formation and growth of the baby.  Follow your health care provider's instructions regarding medicine use. There are medicines that are either safe or unsafe to take during pregnancy.  Exercise only as directed by your health care provider. Experiencing uterine cramps is a good sign to stop exercising.  Continue to eat regular, healthy meals.  Wear a good support bra for breast tenderness.  Do not use hot tubs, steam rooms, or saunas.  Wear your seat belt at all times when driving.  Avoid raw meat, uncooked cheese, cat litter boxes, and soil used by cats. These carry germs that can cause birth defects in the baby.  Take your prenatal vitamins.  Try taking a stool softener (if your health care provider approves) if you develop constipation. Eat more high-fiber foods, such as fresh vegetables or fruit and whole grains. Drink plenty of fluids to keep your urine clear or pale yellow.  Take warm sitz baths to soothe any pain or discomfort caused by hemorrhoids. Use hemorrhoid cream if your health care provider approves.  If you develop varicose veins, wear support hose. Elevate your feet for 15 minutes, 3-4 times a day. Limit salt in your diet.  Avoid heavy lifting, wear low heel shoes, and practice good posture.  Rest with your legs elevated if you have leg cramps or low back pain.  Visit your dentist if you have not gone yet during your pregnancy. Use a soft toothbrush to brush your teeth and be gentle when you floss.  A sexual relationship may be continued unless your health care provider directs you  otherwise.  Continue to go to all your prenatal visits as directed by your health care provider. SEEK MEDICAL CARE IF:   You have dizziness.  You have mild pelvic cramps, pelvic pressure, or nagging pain in the abdominal area.  You have persistent nausea, vomiting, or diarrhea.  You have a bad smelling vaginal discharge.  You have pain with urination. SEEK IMMEDIATE MEDICAL CARE IF:   You have a fever.  You are leaking fluid from your vagina.  You have spotting or bleeding from your vagina.  You have severe abdominal cramping or pain.  You have rapid weight gain or loss.  You have shortness of breath with chest pain.  You notice sudden or extreme swelling of your face, hands, ankles, feet, or legs.  You have not felt your baby move in over an hour.  You have severe headaches that do not go away  with medicine.  You have vision changes. Document Released: 01/16/2001 Document Revised: 01/27/2013 Document Reviewed: 03/25/2012 [facility] Patient Information 2015 [place], [place]. This information is not intended to replace advice given to you by your health care provider. Make sure you discuss any questions you have with your health care provider.

## 2015-03-30 LAB — PMP SCREEN PROFILE (10S), URINE
Amphetamine Screen, Ur: NEGATIVE ng/mL
BARBITURATE SCRN UR: NEGATIVE ng/mL
Benzodiazepine Screen, Urine: NEGATIVE ng/mL
Cannabinoids Ur Ql Scn: NEGATIVE ng/mL
Cocaine(Metab.)Screen, Urine: NEGATIVE ng/mL
Creatinine(Crt), U: 92.5 mg/dL (ref 20.0–300.0)
METHADONE SCREEN, URINE: NEGATIVE ng/mL
Opiate Scrn, Ur: NEGATIVE ng/mL
Oxycodone+Oxymorphone Ur Ql Scn: NEGATIVE ng/mL
PCP Scrn, Ur: NEGATIVE ng/mL
Ph of Urine: 6.9 (ref 4.5–8.9)
Propoxyphene, Screen: NEGATIVE ng/mL

## 2015-04-25 ENCOUNTER — Other Ambulatory Visit: Payer: Self-pay | Admitting: Women's Health

## 2015-04-25 DIAGNOSIS — IMO0002 Reserved for concepts with insufficient information to code with codable children: Secondary | ICD-10-CM

## 2015-04-25 DIAGNOSIS — Z0489 Encounter for examination and observation for other specified reasons: Secondary | ICD-10-CM

## 2015-04-26 ENCOUNTER — Encounter: Payer: Self-pay | Admitting: Women's Health

## 2015-04-26 ENCOUNTER — Ambulatory Visit (INDEPENDENT_AMBULATORY_CARE_PROVIDER_SITE_OTHER): Payer: Medicaid Other | Admitting: Women's Health

## 2015-04-26 ENCOUNTER — Ambulatory Visit (INDEPENDENT_AMBULATORY_CARE_PROVIDER_SITE_OTHER): Payer: Medicaid Other

## 2015-04-26 ENCOUNTER — Other Ambulatory Visit: Payer: Medicaid Other

## 2015-04-26 VITALS — BP 94/54 | HR 68 | Wt 127.0 lb

## 2015-04-26 DIAGNOSIS — Z0373 Encounter for suspected fetal anomaly ruled out: Secondary | ICD-10-CM | POA: Diagnosis not present

## 2015-04-26 DIAGNOSIS — IMO0002 Reserved for concepts with insufficient information to code with codable children: Secondary | ICD-10-CM

## 2015-04-26 DIAGNOSIS — Z1389 Encounter for screening for other disorder: Secondary | ICD-10-CM

## 2015-04-26 DIAGNOSIS — Z331 Pregnant state, incidental: Secondary | ICD-10-CM

## 2015-04-26 DIAGNOSIS — Z0489 Encounter for examination and observation for other specified reasons: Secondary | ICD-10-CM

## 2015-04-26 DIAGNOSIS — Z369 Encounter for antenatal screening, unspecified: Secondary | ICD-10-CM

## 2015-04-26 DIAGNOSIS — Z3402 Encounter for supervision of normal first pregnancy, second trimester: Secondary | ICD-10-CM

## 2015-04-26 DIAGNOSIS — Z3403 Encounter for supervision of normal first pregnancy, third trimester: Secondary | ICD-10-CM

## 2015-04-26 DIAGNOSIS — Z131 Encounter for screening for diabetes mellitus: Secondary | ICD-10-CM

## 2015-04-26 LAB — POCT URINALYSIS DIPSTICK
GLUCOSE UA: NEGATIVE
Ketones, UA: NEGATIVE
LEUKOCYTES UA: NEGATIVE
NITRITE UA: NEGATIVE
RBC UA: NEGATIVE

## 2015-04-26 NOTE — Progress Notes (Signed)
Low-risk OB appointment G1P0000 1070w2d Estimated Date of Delivery: 07/17/15 BP 94/54 mmHg  Pulse 68  Wt 127 lb (57.607 kg)  LMP 10/10/2014 (Exact Date)  BP, weight, and urine reviewed.  Refer to obstetrical flow sheet for FH & FHR.  Reports good fm.  Denies regular uc's, lof, vb, or uti s/s. No complaints. Reviewed today's normal f/u u/s to check spine- anatomy complete. Discussed ptl s/s, fkc. Recommended Tdap at HD/PCP per CDC guidelines.  Plan:  Continue routine obstetrical care  F/U in 4wks for OB appointment  PN2 today

## 2015-04-26 NOTE — Patient Instructions (Signed)

## 2015-04-26 NOTE — Progress Notes (Signed)
US 28+2wks,cephalic,ant pl gr 0,cx 3.6 cm,normal ov's bilat,afi 14cm,fhr 152 bpm,anatomy complete,no obvious abn seen,measurements c/w dates

## 2015-04-27 LAB — GLUCOSE TOLERANCE, 2 HOURS W/ 1HR
GLUCOSE, 1 HOUR: 117 mg/dL (ref 65–179)
GLUCOSE, 2 HOUR: 109 mg/dL (ref 65–152)
Glucose, Fasting: 83 mg/dL (ref 65–91)

## 2015-04-27 LAB — CBC
Hematocrit: 32.5 % — ABNORMAL LOW (ref 34.0–46.6)
Hemoglobin: 10.8 g/dL — ABNORMAL LOW (ref 11.1–15.9)
MCH: 32.6 pg (ref 26.6–33.0)
MCHC: 33.2 g/dL (ref 31.5–35.7)
MCV: 98 fL — AB (ref 79–97)
Platelets: 151 10*3/uL (ref 150–379)
RBC: 3.31 x10E6/uL — ABNORMAL LOW (ref 3.77–5.28)
RDW: 13.9 % (ref 12.3–15.4)
WBC: 5.7 10*3/uL (ref 3.4–10.8)

## 2015-04-27 LAB — ANTIBODY SCREEN: Antibody Screen: NEGATIVE

## 2015-04-27 LAB — RPR: RPR Ser Ql: NONREACTIVE

## 2015-04-27 LAB — HIV ANTIBODY (ROUTINE TESTING W REFLEX): HIV SCREEN 4TH GENERATION: NONREACTIVE

## 2015-05-03 ENCOUNTER — Telehealth: Payer: Self-pay | Admitting: *Deleted

## 2015-05-03 ENCOUNTER — Other Ambulatory Visit: Payer: Self-pay | Admitting: Women's Health

## 2015-05-03 MED ORDER — FERROUS SULFATE 325 (65 FE) MG PO TABS: 325.0000 mg | ORAL_TABLET | Freq: Two times a day (BID) | ORAL | Status: DC

## 2015-05-04 NOTE — Telephone Encounter (Signed)
Pt informed per Joellyn HaffKim Booker, CNM, pt is anemic, Fe e-scribed to pharmacy, continue PNV daily, increase Fe rich foods red meat, green leafy veg, beans. Pt verbalized understanding.

## 2015-05-17 ENCOUNTER — Telehealth: Payer: Self-pay | Admitting: Women's Health

## 2015-05-17 NOTE — Telephone Encounter (Signed)
Pt c/o sharp lower back pain that comes and goes, + FM, no vaginal bleeding. Pt informed to push lots of fluids, take tylenol, if no improvement call our office back. Pt verbalized understanding.

## 2015-05-18 ENCOUNTER — Encounter (HOSPITAL_COMMUNITY): Payer: Self-pay | Admitting: Family Medicine

## 2015-05-18 ENCOUNTER — Emergency Department (HOSPITAL_COMMUNITY)
Admission: EM | Admit: 2015-05-18 | Discharge: 2015-05-18 | Disposition: A | Discharge status: Home / Self Care | Payer: Medicaid Other | Attending: Emergency Medicine | Admitting: Emergency Medicine

## 2015-05-18 DIAGNOSIS — Z3A31 31 weeks gestation of pregnancy: Secondary | ICD-10-CM | POA: Diagnosis not present

## 2015-05-18 DIAGNOSIS — Z79899 Other long term (current) drug therapy: Secondary | ICD-10-CM | POA: Insufficient documentation

## 2015-05-18 DIAGNOSIS — O9989 Other specified diseases and conditions complicating pregnancy, childbirth and the puerperium: Secondary | ICD-10-CM | POA: Insufficient documentation

## 2015-05-18 DIAGNOSIS — Z87891 Personal history of nicotine dependence: Secondary | ICD-10-CM | POA: Diagnosis not present

## 2015-05-18 DIAGNOSIS — M545 Low back pain, unspecified: Secondary | ICD-10-CM

## 2015-05-18 NOTE — ED Provider Notes (Signed)
CSN: [id number]     Arrival date & time 05/18/15  1848 History  By signing my name below, I, Jacqueline Koch, attest that this documentation has been prepared under the direction and in the presence of [employer], PA-C. Electronically Signed: Tanda Koch, ED Scribe. 05/18/2015. 8:33 PM.   Chief Complaint  Patient presents with  . Back Pain   The history is provided by the patient. No language interpreter was used.     HPI Comments:  Jacqueline Koch is a 20 y.o. female who is approximately [redacted] weeks pregnant, presents to the Emergency Department complaining of sudden onset, episodic, sharp, right lower back pain that began last week. Pt reports 1 episode last week and 1 today. The pain lasts for about 1 minute before subsiding on its own. She is currently pain free. She states that she has been sitting down both times when the pain began. No known injury to the back. Denies abdominal pain, vaginal bleeding, vaginal discharge, vaginal leakage, or any other associated symptoms. G1P0.   OBGYN - [facility]  Past Medical History  Diagnosis Date  . Nexplanon removal 06/09/2014  . Pregnant 11/10/2014   History reviewed. No pertinent past surgical history. Family History  Problem Relation Age of Onset  . Asthma Brother   . Arthritis Paternal Grandmother   . Crohn's disease Maternal Grandmother    Social History  Substance Use Topics  . Smoking status: Former Smoker -- 1 years    Types: Cigarettes  . Smokeless tobacco: Never Used  . Alcohol Use: No   OB History    Gravida Para Term Preterm AB TAB SAB Ectopic Multiple Living       Review of Systems  Gastrointestinal: Negative for abdominal pain.  Genitourinary: Negative for vaginal bleeding and vaginal discharge.  Musculoskeletal: Positive for back pain.  All other systems reviewed and are negative.  Allergies  Review of patient's allergies indicates no known allergies.  Home Medications   Prior to  Admission medications   Medication Sig Start Date End Date Taking? Authorizing Provider  ferrous sulfate 325 (65 FE) MG tablet Take 1 tablet (325 mg total) by mouth 2 (two) times daily with a meal. 05/03/15   Cheral Marker, CNM  Prenat w/o A-[id number]-[id number]-DHA (CITRANATAL 90 DHA) 90-1 & 300 MG MISC Take 1 bid 11/24/14   Adline Potter, NP   BP 99/68 mmHg  Pulse 84  Temp(Src) 98 F (36.7 C) (Oral)  Resp 16  SpO2 99%  LMP 10/10/2014 (Exact Date)   Physical Exam  Constitutional: She is oriented to person, place, and time. She appears well-developed and well-nourished. No distress.  HENT:  Head: Normocephalic.  Neck: Normal range of motion. Neck supple.  Pulmonary/Chest: Effort normal.  Abdominal:  Gravid abdomen appropriate for gestational age.   Musculoskeletal: Normal range of motion. She exhibits no edema or tenderness.  No C, T, or L spine tenderness to palpation. No obvious signs of trauma, deformity, infection, step-offs. Lung expansion normal. No scoliosis or kyphosis. Bilateral lower extremity strength 5 out of 5, sensation grossly intact, patellar reflexes 2+, pedal pulses 2+, Refill less than 3 seconds.  Straight leg negative Ambulates without much difficulty.   Neurological: She is alert and oriented to person, place, and time.  Skin: Skin is warm and dry. She is not diaphoretic.  Psychiatric: She has a normal mood and affect. Her behavior is normal. Judgment and thought content normal.  Nursing note and vitals reviewed.   ED Course  Procedures (including critical care time)  DIAGNOSTIC STUDIES: Oxygen Saturation is 99% on RA, normal by my interpretation.    COORDINATION OF CARE: 8:29 PM-Discussed treatment plan which includes follow up with OBGYN with pt at bedside and pt agreed to plan.   Labs Review Labs Reviewed - No data to display  Imaging Review No results found.    EKG Interpretation None      MDM   Final diagnoses:  Right-sided low back  pain without sciatica    Labs: None  Imaging: None  Consults: None  Therapeutics: None  Discharge Meds: None  Assessment/Plan: 20 year old female presents today with acute onset of back pain. This is the second episode, does not persists, not associated with any abdominal pain, vaginal bleeding, discharge, or any other significant signs or symptoms. This is likely musculoskeletal back pain, she will be discharged home with instructions follow-up with her OB/GYN, return to the women's emergency room if any new or worsening signs or symptoms present. Patient has no urinary complaints, no pulmonary complaints. Patient verbalized understanding and agreement today's plan had no further questions or concerns.      I personally performed the services described in this documentation, which was scribed in my presence. The recorded information has been reviewed and is accurate.     Eyvonne MechanicJeffrey Alla Sloma, PA-C 05/18/15 2141  Doug SouSam Jacubowitz, MD 05/19/15 [phone]

## 2015-05-18 NOTE — Discharge Instructions (Signed)
Please contact your OB/GYN inform them of today's visit and all relevant data, please follow-up with the women's emergency room if any new or worsening signs or symptoms present. Back Pain in Pregnancy Back pain during pregnancy is common. It happens in about half of all pregnancies. It is important for you and your baby that you remain active during your pregnancy.If you feel that back pain is not allowing you to remain active or sleep well, it is time to see your caregiver. Back pain may be caused by several factors related to changes during your pregnancy.Fortunately, unless you had trouble with your back before your pregnancy, the pain is likely to get better after you deliver. Low back pain usually occurs between the fifth and seventh months of pregnancy. It can, however, happen in the first couple months. Factors that increase the risk of back problems include:   Previous back problems.  Injury to your back.  Having twins or multiple births.  A chronic cough.  Stress.  Job-related repetitive motions.  Muscle or spinal disease in the back.  Family history of back problems, ruptured (herniated) discs, or osteoporosis.  Depression, anxiety, and panic attacks. CAUSES   When you are pregnant, your body produces a hormone called relaxin. This hormonemakes the ligaments connecting the low back and pubic bones more flexible. This flexibility allows the baby to be delivered more easily. When your ligaments are loose, your muscles need to work harder to support your back. Soreness in your back can come from tired muscles. Soreness can also come from back tissues that are irritated since they are receiving less support.  As the baby grows, it puts pressure on the nerves and blood vessels in your pelvis. This can cause back pain.  As the baby grows and gets heavier during pregnancy, the uterus pushes the stomach muscles forward and changes your center of gravity. This makes your back  muscles work harder to maintain good posture. SYMPTOMS  Lumbar pain during pregnancy Lumbar pain during pregnancy usually occurs at or above the waist in the center of the back. There may be pain and numbness that radiates into your leg or foot. This is similar to low back pain experienced by non-pregnant women. It usually increases with sitting for long periods of time, standing, or repetitive lifting. Tenderness may also be present in the muscles along your upper back. Posterior pelvic pain during pregnancy Pain in the back of the pelvis is more common than lumbar pain in pregnancy. It is a deep pain felt in your side at the waistline, or across the tailbone (sacrum), or in both places. You may have pain on one or both sides. This pain can also go into the buttocks and backs of the upper thighs. Pubic and groin pain may also be present. The pain does not quickly resolve with rest, and morning stiffness may also be present. Pelvic pain during pregnancy can be brought on by most activities. A high level of fitness before and during pregnancy may or may not prevent this problem. Labor pain is usually 1 to 2 minutes apart, lasts for about 1 minute, and involves a bearing down feeling or pressure in your pelvis. However, if you are at term with the pregnancy, constant low back pain can be the beginning of early labor, and you should be aware of this. DIAGNOSIS  X-rays of the back should not be done during the first 12 to 14 weeks of the pregnancy and only when absolutely necessary during the  rest of the pregnancy. MRIs do not give off radiation and are safe during pregnancy. MRIs also should only be done when absolutely necessary. HOME CARE INSTRUCTIONS  Exercise as directed by your caregiver. Exercise is the most effective way to prevent or manage back pain. If you have a back problem, it is especially important to avoid sports that require sudden body movements. Swimming and walking are great  activities.  Do not stand in one place for long periods of time.  Do not wear high heels.  Sit in chairs with good posture. Use a pillow on your lower back if necessary. Make sure your head rests over your shoulders and is not hanging forward.  Try sleeping on your side, preferably the left side, with a pillow or two between your legs. If you are sore after a night's rest, your bedmay betoo soft.Try placing a board between your mattress and box spring.  Listen to your body when lifting.If you are experiencing pain, ask for help or try bending yourknees more so you can use your leg muscles rather than your back muscles. Squat down when picking up something from the floor. Do not bend over.  Eat a healthy diet. Try to gain weight within your caregiver's recommendations.  Use heat or cold packs 3 to 4 times a day for 15 minutes to help with the pain.  Only take over-the-counter or prescription medicines for pain, discomfort, or fever as directed by your caregiver. Sudden (acute) back pain  Use bed rest for only the most extreme, acute episodes of back pain. Prolonged bed rest over 48 hours will aggravate your condition.  Ice is very effective for acute conditions.  Put ice in a plastic bag.  Place a towel between your skin and the bag.  Leave the ice on for 10 to 20 minutes every 2 hours, or as needed.  Using heat packs for 30 minutes prior to activities is also helpful. Continued back pain See your caregiver if you have continued problems. Your caregiver can help or refer you for appropriate physical therapy. With conditioning, most back problems can be avoided. Sometimes, a more serious issue may be the cause of back pain. You should be seen right away if new problems seem to be developing. Your caregiver may recommend:  A maternity girdle.  An elastic sling.  A back brace.  A massage therapist or acupuncture. SEEK MEDICAL CARE IF:   You are not able to do most of your  daily activities, even when taking the pain medicine you were given.  You need a referral to a physical therapist or chiropractor.  You want to try acupuncture. SEEK IMMEDIATE MEDICAL CARE IF:  You develop numbness, tingling, weakness, or problems with the use of your arms or legs.  You develop severe back pain that is no longer relieved with medicines.  You have a sudden change in bowel or bladder control.  You have increasing pain in other areas of the body.  You develop shortness of breath, dizziness, or fainting.  You develop nausea, vomiting, or sweating.  You have back pain which is similar to labor pains.  You have back pain along with your water breaking or vaginal bleeding.  You have back pain or numbness that travels down your leg.  Your back pain developed after you fell.  You develop pain on one side of your back. You may have a kidney stone.  You see blood in your urine. You may have a bladder infection or  kidney stone.  You have back pain with blisters. You may have shingles. Back pain is fairly common during pregnancy but should not be accepted as just part of the process. Back pain should always be treated as soon as possible. This will make your pregnancy as pleasant as possible.   This information is not intended to replace advice given to you by your health care provider. Make sure you discuss any questions you have with your health care provider.   Document Released: 05/02/2005 Document Revised: 04/16/2011 Document Reviewed: 06/13/2010 Elsevier Interactive Patient Education [employer]

## 2015-05-18 NOTE — ED Notes (Addendum)
Pt here for lower back pain radiating more into right lower back and right buttocks. Pt denies abd pain, bleeding, leaking.

## 2015-05-18 NOTE — ED Notes (Signed)
EDP at bedside  

## 2015-05-18 NOTE — ED Notes (Signed)
Pt ambulatory in NAD. See EDP assessment

## 2015-05-19 ENCOUNTER — Telehealth: Payer: Self-pay | Admitting: Obstetrics & Gynecology

## 2015-05-19 NOTE — Telephone Encounter (Signed)
Spoke with pt. Pt states her back is still sore and hurting. Pain in right side. Pt went to hospital last pm and states they said it was round ligament pain. I advised to take Tylenol prn for discomfort and can try a heating pad, not on high setting for back. Advised if pain got worse, call the after hours nurse line for advise. Pt voiced understanding. JSY

## 2015-05-19 NOTE — Telephone Encounter (Signed)
Left message x 1. JSY 

## 2015-05-24 ENCOUNTER — Encounter: Payer: Self-pay | Admitting: Women's Health

## 2015-05-24 ENCOUNTER — Ambulatory Visit (INDEPENDENT_AMBULATORY_CARE_PROVIDER_SITE_OTHER): Payer: Medicaid Other | Admitting: Women's Health

## 2015-05-24 VITALS — BP 96/62 | HR 76 | Wt 133.0 lb

## 2015-05-24 DIAGNOSIS — R82998 Other abnormal findings in urine: Secondary | ICD-10-CM

## 2015-05-24 DIAGNOSIS — O26893 Other specified pregnancy related conditions, third trimester: Secondary | ICD-10-CM | POA: Diagnosis not present

## 2015-05-24 DIAGNOSIS — Z3A33 33 weeks gestation of pregnancy: Secondary | ICD-10-CM

## 2015-05-24 DIAGNOSIS — Z331 Pregnant state, incidental: Secondary | ICD-10-CM

## 2015-05-24 DIAGNOSIS — O360131 Maternal care for anti-D [Rh] antibodies, third trimester, fetus 1: Secondary | ICD-10-CM | POA: Diagnosis not present

## 2015-05-24 DIAGNOSIS — N898 Other specified noninflammatory disorders of vagina: Secondary | ICD-10-CM | POA: Diagnosis not present

## 2015-05-24 DIAGNOSIS — Z6791 Unspecified blood type, Rh negative: Secondary | ICD-10-CM

## 2015-05-24 DIAGNOSIS — Z1389 Encounter for screening for other disorder: Secondary | ICD-10-CM

## 2015-05-24 DIAGNOSIS — O360130 Maternal care for anti-D [Rh] antibodies, third trimester, not applicable or unspecified: Secondary | ICD-10-CM

## 2015-05-24 DIAGNOSIS — Z3403 Encounter for supervision of normal first pregnancy, third trimester: Secondary | ICD-10-CM

## 2015-05-24 LAB — POCT URINALYSIS DIPSTICK
Glucose, UA: NEGATIVE
KETONES UA: NEGATIVE
Nitrite, UA: NEGATIVE

## 2015-05-24 LAB — POCT WET PREP (WET MOUNT): CLUE CELLS WET PREP WHIFF POC: NEGATIVE

## 2015-05-24 MED ORDER — RHO D IMMUNE GLOBULIN 1500 UNIT/2ML IJ SOSY
300.0000 ug | PREFILLED_SYRINGE | Freq: Once | INTRAMUSCULAR | Status: AC
  Administered 2015-05-24: 300 ug via INTRAMUSCULAR

## 2015-05-24 NOTE — Progress Notes (Signed)
Low-risk OB appointment G1P0000 2175w2d Estimated Date of Delivery: 07/17/15 BP 96/62 mmHg  Pulse 76  Wt 133 lb (60.328 kg)  LMP 10/10/2014 (Exact Date)  BP, weight, and urine reviewed.  Refer to obstetrical flow sheet for FH & FHR.  Reports good fm.  Denies regular uc's, lof, vb, or uti s/s. Had stringy yellow d/c over weekend, none since. Denies itching/odor/irritation.  Spec exam: cx visually closed, small amt white nonodorous d/c, wet prep: few clues, otherwise neg. Not inclined to tx as only a few sporadic clues, no odor, asymptomatic. To let us know if develops sx/odor.  Reviewed ptl s/s, fkc, pn2 results. Plan:  Continue routine obstetrical care  F/U in 2wks for OB appointment  Rhogam today

## 2015-05-24 NOTE — Patient Instructions (Signed)
Call the office ([phone]) or go to [facility] if:  You begin to have strong, frequent contractions  Your water breaks.  Sometimes it is a big gush of fluid, sometimes it is just a trickle that keeps getting your panties wet or running down your legs  You have vaginal bleeding.  It is normal to have a small amount of spotting if your cervix was checked.   You don't feel your baby moving like normal.  If you don't, get you something to eat and drink and lay down and focus on feeling your baby move.  You should feel at least 10 movements in 2 hours.  If you don't, you should call the office or go to [facility].    Preterm Labor Information Preterm labor is when labor starts at less than 37 weeks of pregnancy. The normal length of a pregnancy is 39 to 41 weeks. CAUSES Often, there is no identifiable underlying cause as to why a woman goes into preterm labor. One of the most common known causes of preterm labor is infection. Infections of the uterus, cervix, vagina, amniotic sac, bladder, kidney, or even the lungs (pneumonia) can cause labor to start. Other suspected causes of preterm labor include:   Urogenital infections, such as yeast infections and bacterial vaginosis.   Uterine abnormalities (uterine shape, uterine septum, fibroids, or bleeding from the placenta).   A cervix that has been operated on (it may fail to stay closed).   Malformations in the fetus.   Multiple gestations (twins, triplets, and so on).   Breakage of the amniotic sac.  RISK FACTORS  Having a previous history of preterm labor.   Having premature rupture of membranes (PROM).   Having a placenta that covers the opening of the cervix (placenta previa).   Having a placenta that separates from the uterus (placental abruption).   Having a cervix that is too weak to hold the fetus in the uterus (incompetent cervix).   Having too much fluid in the amniotic sac (polyhydramnios).   Taking  illegal drugs or smoking while pregnant.   Not gaining enough weight while pregnant.   Being younger than 18 and older than 21 years old.   Having a low socioeconomic status.   Being African American. SYMPTOMS Signs and symptoms of preterm labor include:   Menstrual-like cramps, abdominal pain, or back pain.  Uterine contractions that are regular, as frequent as six in an hour, regardless of their intensity (may be mild or painful).  Contractions that start on the top of the uterus and spread down to the lower abdomen and back.   A sense of increased pelvic pressure.   A watery or bloody mucus discharge that comes from the vagina.  TREATMENT Depending on the length of the pregnancy and other circumstances, your health care provider may suggest bed rest. If necessary, there are medicines that can be given to stop contractions and to mature the fetal lungs. If labor happens before 34 weeks of pregnancy, a prolonged hospital stay may be recommended. Treatment depends on the condition of both you and the fetus.  WHAT SHOULD YOU DO IF YOU THINK YOU ARE IN PRETERM LABOR? Call your health care provider right away. You will need to go to the hospital to get checked immediately. HOW CAN YOU PREVENT PRETERM LABOR IN FUTURE PREGNANCIES? You should:   Stop smoking if you smoke.  Maintain healthy weight gain and avoid chemicals and drugs that are not necessary.  Be watchful for   any type of infection.  Inform your health care provider if you have a known history of preterm labor.   This information is not intended to replace advice given to you by your health care provider. Make sure you discuss any questions you have with your health care provider.   Document Released: 04/14/2003 Document Revised: 09/24/2012 Document Reviewed: 02/25/2012 Elsevier Interactive Patient Education 2016 Elsevier Inc.  

## 2015-05-26 LAB — URINE CULTURE

## 2015-05-30 ENCOUNTER — Telehealth: Payer: Self-pay | Admitting: *Deleted

## 2015-05-30 ENCOUNTER — Other Ambulatory Visit: Payer: Self-pay | Admitting: Women's Health

## 2015-05-30 ENCOUNTER — Encounter: Payer: Self-pay | Admitting: Women's Health

## 2015-05-30 DIAGNOSIS — R8271 Bacteriuria: Secondary | ICD-10-CM | POA: Insufficient documentation

## 2015-05-30 LAB — OB RESULTS CONSOLE GBS: STREP GROUP B AG: POSITIVE

## 2015-05-30 MED ORDER — AMOXICILLIN 500 MG PO CAPS: 500.0000 mg | ORAL_CAPSULE | Freq: Two times a day (BID) | ORAL | Status: DC

## 2015-05-30 NOTE — Telephone Encounter (Signed)
Patient informed Urine positive for GBS, Amox[facility], will be given an ABX at time of delivery. Pt verbalized understanding.

## 2015-06-07 ENCOUNTER — Encounter: Payer: Self-pay | Admitting: Women's Health

## 2015-06-07 ENCOUNTER — Ambulatory Visit (INDEPENDENT_AMBULATORY_CARE_PROVIDER_SITE_OTHER): Payer: Medicaid Other | Admitting: Women's Health

## 2015-06-07 VITALS — BP 90/60 | HR 64 | Wt 135.0 lb

## 2015-06-07 DIAGNOSIS — O2343 Unspecified infection of urinary tract in pregnancy, third trimester: Secondary | ICD-10-CM

## 2015-06-07 DIAGNOSIS — Z331 Pregnant state, incidental: Secondary | ICD-10-CM

## 2015-06-07 DIAGNOSIS — Z1389 Encounter for screening for other disorder: Secondary | ICD-10-CM

## 2015-06-07 DIAGNOSIS — Z3403 Encounter for supervision of normal first pregnancy, third trimester: Secondary | ICD-10-CM

## 2015-06-07 LAB — POCT URINALYSIS DIPSTICK
Glucose, UA: NEGATIVE
KETONES UA: NEGATIVE
Leukocytes, UA: NEGATIVE
Nitrite, UA: NEGATIVE
PROTEIN UA: NEGATIVE

## 2015-06-07 NOTE — Patient Instructions (Signed)
Call the office ([phone]) or go to [facility] if:  You begin to have strong, frequent contractions  Your water breaks.  Sometimes it is a big gush of fluid, sometimes it is just a trickle that keeps getting your panties wet or running down your legs  You have vaginal bleeding.  It is normal to have a small amount of spotting if your cervix was checked.   You don't feel your baby moving like normal.  If you don't, get you something to eat and drink and lay down and focus on feeling your baby move.  You should feel at least 10 movements in 2 hours.  If you don't, you should call the office or go to [facility].    Preterm Labor Information Preterm labor is when labor starts at less than 37 weeks of pregnancy. The normal length of a pregnancy is 39 to 41 weeks. CAUSES Often, there is no identifiable underlying cause as to why a woman goes into preterm labor. One of the most common known causes of preterm labor is infection. Infections of the uterus, cervix, vagina, amniotic sac, bladder, kidney, or even the lungs (pneumonia) can cause labor to start. Other suspected causes of preterm labor include:   Urogenital infections, such as yeast infections and bacterial vaginosis.   Uterine abnormalities (uterine shape, uterine septum, fibroids, or bleeding from the placenta).   A cervix that has been operated on (it may fail to stay closed).   Malformations in the fetus.   Multiple gestations (twins, triplets, and so on).   Breakage of the amniotic sac.  RISK FACTORS  Having a previous history of preterm labor.   Having premature rupture of membranes (PROM).   Having a placenta that covers the opening of the cervix (placenta previa).   Having a placenta that separates from the uterus (placental abruption).   Having a cervix that is too weak to hold the fetus in the uterus (incompetent cervix).   Having too much fluid in the amniotic sac (polyhydramnios).   Taking  illegal drugs or smoking while pregnant.   Not gaining enough weight while pregnant.   Being younger than 18 and older than 21 years old.   Having a low socioeconomic status.   Being African American. SYMPTOMS Signs and symptoms of preterm labor include:   Menstrual-like cramps, abdominal pain, or back pain.  Uterine contractions that are regular, as frequent as six in an hour, regardless of their intensity (may be mild or painful).  Contractions that start on the top of the uterus and spread down to the lower abdomen and back.   A sense of increased pelvic pressure.   A watery or bloody mucus discharge that comes from the vagina.  TREATMENT Depending on the length of the pregnancy and other circumstances, your health care provider may suggest bed rest. If necessary, there are medicines that can be given to stop contractions and to mature the fetal lungs. If labor happens before 34 weeks of pregnancy, a prolonged hospital stay may be recommended. Treatment depends on the condition of both you and the fetus.  WHAT SHOULD YOU DO IF YOU THINK YOU ARE IN PRETERM LABOR? Call your health care provider right away. You will need to go to the hospital to get checked immediately. HOW CAN YOU PREVENT PRETERM LABOR IN FUTURE PREGNANCIES? You should:   Stop smoking if you smoke.  Maintain healthy weight gain and avoid chemicals and drugs that are not necessary.  Be watchful for   any type of infection.  Inform your health care provider if you have a known history of preterm labor.   This information is not intended to replace advice given to you by your health care provider. Make sure you discuss any questions you have with your health care provider.   Document Released: 04/14/2003 Document Revised: 09/24/2012 Document Reviewed: 02/25/2012 Elsevier Interactive Patient Education 2016 Elsevier Inc.  

## 2015-06-07 NOTE — Progress Notes (Signed)
Low-risk OB appointment G1P0000 3526w2d Estimated Date of Delivery: 07/17/15 BP 90/60 mmHg  Pulse 64  Wt 135 lb (61.236 kg)  LMP 10/10/2014 (Exact Date)  BP, weight, and urine reviewed.  Refer to obstetrical flow sheet for FH & FHR.  Reports good fm.  Denies regular uc's, lof, vb, or uti s/s. No complaints. Finished amoxicillin yesterday for gbs bacteruria, will send urine cx for poc today.  Reviewed ptl s/s, fkc. Plan:  Continue routine obstetrical care  F/U in 2wks for OB appointment

## 2015-06-09 LAB — URINE CULTURE: ORGANISM ID, BACTERIA: NO GROWTH

## 2015-06-21 ENCOUNTER — Ambulatory Visit (INDEPENDENT_AMBULATORY_CARE_PROVIDER_SITE_OTHER): Payer: Medicaid Other | Admitting: Advanced Practice Midwife

## 2015-06-21 ENCOUNTER — Encounter: Payer: Self-pay | Admitting: Advanced Practice Midwife

## 2015-06-21 VITALS — BP 90/60 | HR 75 | Wt 139.0 lb

## 2015-06-21 DIAGNOSIS — Z3403 Encounter for supervision of normal first pregnancy, third trimester: Secondary | ICD-10-CM

## 2015-06-21 DIAGNOSIS — Z1389 Encounter for screening for other disorder: Secondary | ICD-10-CM

## 2015-06-21 DIAGNOSIS — Z113 Encounter for screening for infections with a predominantly sexual mode of transmission: Secondary | ICD-10-CM

## 2015-06-21 DIAGNOSIS — Z331 Pregnant state, incidental: Secondary | ICD-10-CM

## 2015-06-21 LAB — POCT URINALYSIS DIPSTICK
Blood, UA: NEGATIVE
GLUCOSE UA: NEGATIVE
Ketones, UA: NEGATIVE
LEUKOCYTES UA: NEGATIVE
NITRITE UA: NEGATIVE

## 2015-06-21 NOTE — Progress Notes (Signed)
Pt denies any problems or concerns at this time.  

## 2015-06-21 NOTE — Progress Notes (Signed)
G1P0000 848w2d Estimated Date of Delivery: 07/17/15  Blood pressure 90/60, pulse 75, weight 139 lb (63.05 kg), last menstrual period 10/10/2014.   BP weight and urine results all reviewed and noted.  Please refer to the obstetrical flow sheet for the fundal height and fetal heart rate documentation:  Patient reports good fetal movement, denies any bleeding and no rupture of membranes symptoms or regular contractions. Patient is without complaints. All questions were answered.  Orders Placed This Encounter  Procedures  . GC/Chlamydia Probe Amp  . POCT urinalysis dipstick    Plan:  Continued routine obstetrical care,   Return in about 1 week (around 06/28/2015) for LROB.

## 2015-06-21 NOTE — Patient Instructions (Signed)

## 2015-06-22 LAB — GC/CHLAMYDIA PROBE AMP
CHLAMYDIA, DNA PROBE: NEGATIVE
NEISSERIA GONORRHOEAE BY PCR: NEGATIVE

## 2015-06-29 ENCOUNTER — Ambulatory Visit (INDEPENDENT_AMBULATORY_CARE_PROVIDER_SITE_OTHER): Payer: Medicaid Other | Admitting: Women's Health

## 2015-06-29 ENCOUNTER — Encounter: Payer: Self-pay | Admitting: Women's Health

## 2015-06-29 VITALS — BP 96/58 | HR 80 | Wt 141.0 lb

## 2015-06-29 DIAGNOSIS — Z1389 Encounter for screening for other disorder: Secondary | ICD-10-CM

## 2015-06-29 DIAGNOSIS — Z3A38 38 weeks gestation of pregnancy: Secondary | ICD-10-CM

## 2015-06-29 DIAGNOSIS — Z331 Pregnant state, incidental: Secondary | ICD-10-CM

## 2015-06-29 DIAGNOSIS — Z3403 Encounter for supervision of normal first pregnancy, third trimester: Secondary | ICD-10-CM

## 2015-06-29 LAB — POCT URINALYSIS DIPSTICK
GLUCOSE UA: NEGATIVE
Ketones, UA: NEGATIVE
Nitrite, UA: NEGATIVE
Protein, UA: NEGATIVE
RBC UA: NEGATIVE

## 2015-06-29 NOTE — Patient Instructions (Signed)
Call the office ([phone]) or go to [facility] if:  You begin to have strong, frequent contractions  Your water breaks.  Sometimes it is a big gush of fluid, sometimes it is just a trickle that keeps getting your panties wet or running down your legs  You have vaginal bleeding.  It is normal to have a small amount of spotting if your cervix was checked.   You don't feel your baby moving like normal.  If you don't, get you something to eat and drink and lay down and focus on feeling your baby move.  You should feel at least 10 movements in 2 hours.  If you don't, you should call the office or go to [facility].    Braxton Hicks Contractions Contractions of the uterus can occur throughout pregnancy. Contractions are not always a sign that you are in labor.  WHAT ARE BRAXTON HICKS CONTRACTIONS?  Contractions that occur before labor are called Braxton Hicks contractions, or false labor. Toward the end of pregnancy (32-34 weeks), these contractions can develop more often and may become more forceful. This is not true labor because these contractions do not result in opening (dilatation) and thinning of the cervix. They are sometimes difficult to tell apart from true labor because these contractions can be forceful and people have different pain tolerances. You should not feel embarrassed if you go to the hospital with false labor. Sometimes, the only way to tell if you are in true labor is for your health care provider to look for changes in the cervix. If there are no prenatal problems or other health problems associated with the pregnancy, it is completely safe to be sent home with false labor and await the onset of true labor. HOW CAN YOU TELL THE DIFFERENCE BETWEEN TRUE AND FALSE LABOR? False Labor  The contractions of false labor are usually shorter and not as hard as those of true labor.   The contractions are usually irregular.   The contractions are often felt in the front of  the lower abdomen and in the groin.   The contractions may go away when you walk around or change positions while lying down.   The contractions get weaker and are shorter lasting as time goes on.   The contractions do not usually become progressively stronger, regular, and closer together as with true labor.  True Labor  Contractions in true labor last 30-70 seconds, become very regular, usually become more intense, and increase in frequency.   The contractions do not go away with walking.   The discomfort is usually felt in the top of the uterus and spreads to the lower abdomen and low back.   True labor can be determined by your health care provider with an exam. This will show that the cervix is dilating and getting thinner.  WHAT TO REMEMBER  Keep up with your usual exercises and follow other instructions given by your health care provider.   Take medicines as directed by your health care provider.   Keep your regular prenatal appointments.   Eat and drink lightly if you think you are going into labor.   If Braxton Hicks contractions are making you uncomfortable:   Change your position from lying down or resting to walking, or from walking to resting.   Sit and rest in a tub of warm water.   Drink 2-3 glasses of water. Dehydration may cause these contractions.   Do slow and deep breathing several times an hour.    WHEN SHOULD I SEEK IMMEDIATE MEDICAL CARE? Seek immediate medical care if:  Your contractions become stronger, more regular, and closer together.   You have fluid leaking or gushing from your vagina.   You have a fever.   You pass blood-tinged mucus.   You have vaginal bleeding.   You have continuous abdominal pain.   You have low back pain that you never had before.   You feel your baby's head pushing down and causing pelvic pressure.   Your baby is not moving as much as it used to.    This information is not intended to  replace advice given to you by your health care provider. Make sure you discuss any questions you have with your health care provider.   Document Released: 01/22/2005 Document Revised: 01/27/2013 Document Reviewed: 11/03/2012 Elsevier Interactive Patient Education 2016 Elsevier Inc.  

## 2015-06-29 NOTE — Progress Notes (Signed)
Low-risk OB appointment G1P0000 3344w3d Estimated Date of Delivery: 07/17/15 BP 96/58 mmHg  Pulse 80  Wt 141 lb (63.957 kg)  LMP 10/10/2014 (Exact Date)  BP, weight, and urine reviewed.  Refer to obstetrical flow sheet for FH & FHR.  Reports good fm.  Denies regular uc's, lof, vb, or uti s/s. No complaints. SVE per request: 1.5/50/ballotable vtx Reviewed labor s/s, fkc. Plan:  Continue routine obstetrical care  F/U in 1wk for OB appointment

## 2015-07-06 ENCOUNTER — Ambulatory Visit (INDEPENDENT_AMBULATORY_CARE_PROVIDER_SITE_OTHER): Payer: Medicaid Other | Admitting: Advanced Practice Midwife

## 2015-07-06 ENCOUNTER — Encounter: Payer: Self-pay | Admitting: Advanced Practice Midwife

## 2015-07-06 VITALS — BP 100/70 | HR 76 | Wt 140.0 lb

## 2015-07-06 DIAGNOSIS — Z3A39 39 weeks gestation of pregnancy: Secondary | ICD-10-CM

## 2015-07-06 DIAGNOSIS — Z3403 Encounter for supervision of normal first pregnancy, third trimester: Secondary | ICD-10-CM

## 2015-07-06 DIAGNOSIS — Z1389 Encounter for screening for other disorder: Secondary | ICD-10-CM

## 2015-07-06 DIAGNOSIS — Z331 Pregnant state, incidental: Secondary | ICD-10-CM

## 2015-07-06 LAB — POCT URINALYSIS DIPSTICK
Glucose, UA: NEGATIVE
KETONES UA: NEGATIVE
Leukocytes, UA: NEGATIVE
Nitrite, UA: NEGATIVE
RBC UA: NEGATIVE

## 2015-07-06 NOTE — Progress Notes (Signed)
G1P0000 5856w3d Estimated Date of Delivery: 07/17/15  Last menstrual period 10/10/2014.   BP weight and urine results all reviewed and noted.  Please refer to the obstetrical flow sheet for the fundal height and fetal heart rate documentation:  Patient reports good fetal movement, denies any bleeding and no rupture of membranes symptoms or regular contractions. Patient is without complaints. All questions were answered.  No orders of the defined types were placed in this encounter.    Plan:  Continued routine obstetrical care, Nexplanon ordered  Return in about 1 week (around 07/13/2015) for LROB.

## 2015-07-12 ENCOUNTER — Ambulatory Visit (INDEPENDENT_AMBULATORY_CARE_PROVIDER_SITE_OTHER): Payer: Medicaid Other | Admitting: Women's Health

## 2015-07-12 ENCOUNTER — Encounter: Payer: Self-pay | Admitting: Women's Health

## 2015-07-12 VITALS — BP 98/56 | HR 80 | Wt 141.0 lb

## 2015-07-12 DIAGNOSIS — Z1389 Encounter for screening for other disorder: Secondary | ICD-10-CM

## 2015-07-12 DIAGNOSIS — Z331 Pregnant state, incidental: Secondary | ICD-10-CM

## 2015-07-12 DIAGNOSIS — N898 Other specified noninflammatory disorders of vagina: Secondary | ICD-10-CM

## 2015-07-12 DIAGNOSIS — Z3403 Encounter for supervision of normal first pregnancy, third trimester: Secondary | ICD-10-CM

## 2015-07-12 DIAGNOSIS — O2693 Pregnancy related conditions, unspecified, third trimester: Secondary | ICD-10-CM | POA: Diagnosis not present

## 2015-07-12 DIAGNOSIS — O26893 Other specified pregnancy related conditions, third trimester: Secondary | ICD-10-CM

## 2015-07-12 DIAGNOSIS — Z3A40 40 weeks gestation of pregnancy: Secondary | ICD-10-CM

## 2015-07-12 LAB — POCT URINALYSIS DIPSTICK
Blood, UA: NEGATIVE
GLUCOSE UA: NEGATIVE
Ketones, UA: NEGATIVE
NITRITE UA: NEGATIVE

## 2015-07-12 LAB — POCT WET PREP (WET MOUNT): Clue Cells Wet Prep Whiff POC: NEGATIVE

## 2015-07-12 NOTE — Progress Notes (Signed)
Low-risk OB appointment G1P0000 6533w2d Estimated Date of Delivery: 07/17/15 BP 98/56 mmHg  Pulse 80  Wt 141 lb (63.957 kg)  LMP 10/10/2014 (Exact Date)  BP, weight, and urine reviewed.  Refer to obstetrical flow sheet for FH & FHR.  Reports good fm.  Denies regular uc's, lof, vb, or uti s/s. Yellow d/c x few weeks, no itching/odor.  Spec exam: cx visually slightly open, mod amt slightly thickened white nonodorous d/c, wet prep few yeast otherwise neg- otc monistat 7 SVE: tight 3/80/-2, vtx Offered membrane sweeping, discussed r/b- pt decided to proceed, so membranes swept.  Reviewed labor s/s, fkc Plan:  Continue routine obstetrical care  F/U in 1wk for OB appointment

## 2015-07-12 NOTE — Patient Instructions (Addendum)
[address]  Monistat 7 for yeast (no sex while taking)  Call the office [phone]) or go to [facility] if:  You begin to have strong, frequent contractions  Your water breaks.  Sometimes it is a big gush of fluid, sometimes it is just a trickle that keeps getting your panties wet or running down your legs  You have vaginal bleeding.  It is normal to have a small amount of spotting if your cervix was checked.   You don't feel your baby moving like normal.  If you don't, get you something to eat and drink and lay down and focus on feeling your baby move.  You should feel at least 10 movements in 2 hours.  If you don't, you should call the office or go to [facility]    [facility] Contractions Contractions of the uterus can occur throughout pregnancy. Contractions are not always a sign that you are in labor.  WHAT ARE BRAXTON HICKS CONTRACTIONS?  Contractions that occur before labor are called Braxton Hicks contractions, or false labor. Toward the end of pregnancy (32-34 weeks), these contractions can develop more often and may become more forceful. This is not true labor because these contractions do not result in opening (dilatation) and thinning of the cervix. They are sometimes difficult to tell apart from true labor because these contractions can be forceful and people have different pain tolerances. You should not feel embarrassed if you go to the hospital with false labor. Sometimes, the only way to tell if you are in true labor is for your health care provider to look for changes in the cervix. If there are no prenatal problems or other health problems associated with the pregnancy, it is completely safe to be sent home with false labor and await the onset of true labor. HOW CAN YOU TELL THE DIFFERENCE BETWEEN TRUE AND FALSE LABOR? False Labor  The contractions of false labor are usually shorter and not as hard as those of true labor.   The contractions are  usually irregular.   The contractions are often felt in the front of the lower abdomen and in the groin.   The contractions may go away when you walk around or change positions while lying down.   The contractions get weaker and are shorter lasting as time goes on.   The contractions do not usually become progressively stronger, regular, and closer together as with true labor.  True Labor  Contractions in true labor last 30-70 seconds, become very regular, usually become more intense, and increase in frequency.   The contractions do not go away with walking.   The discomfort is usually felt in the top of the uterus and spreads to the lower abdomen and low back.   True labor can be determined by your health care provider with an exam. This will show that the cervix is dilating and getting thinner.  WHAT TO REMEMBER  Keep up with your usual exercises and follow other instructions given by your health care provider.   Take medicines as directed by your health care provider.   Keep your regular prenatal appointments.   Eat and drink lightly if you think you are going into labor.   If Braxton Hicks contractions are making you uncomfortable:   Change your position from lying down or resting to walking, or from walking to resting.   Sit and rest in a tub of warm water.   Drink 2-3 glasses of water. Dehydration may  cause these contractions.   Do slow and deep breathing several times an hour.  WHEN SHOULD I SEEK IMMEDIATE MEDICAL CARE? Seek immediate medical care if:  Your contractions become stronger, more regular, and closer together.   You have fluid leaking or gushing from your vagina.   You have a fever.   You pass blood-tinged mucus.   You have vaginal bleeding.   You have continuous abdominal pain.   You have low back pain that you never had before.   You feel your baby's head pushing down and causing pelvic pressure.   Your baby is not  moving as much as it used to.    This information is not intended to replace advice given to you by your health care provider. Make sure you discuss any questions you have with your health care provider.   Document Released: 01/22/2005 Document Revised: 01/27/2013 Document Reviewed: 11/03/2012 Elsevier Interactive Patient Education [employer]

## 2015-07-13 ENCOUNTER — Ambulatory Visit (INDEPENDENT_AMBULATORY_CARE_PROVIDER_SITE_OTHER): Payer: Medicaid Other | Admitting: Women's Health

## 2015-07-13 ENCOUNTER — Encounter: Payer: Self-pay | Admitting: Women's Health

## 2015-07-13 VITALS — BP 104/60 | HR 100 | Wt 142.0 lb

## 2015-07-13 DIAGNOSIS — Z331 Pregnant state, incidental: Secondary | ICD-10-CM

## 2015-07-13 DIAGNOSIS — O368130 Decreased fetal movements, third trimester, not applicable or unspecified: Secondary | ICD-10-CM | POA: Diagnosis not present

## 2015-07-13 DIAGNOSIS — Z3402 Encounter for supervision of normal first pregnancy, second trimester: Secondary | ICD-10-CM

## 2015-07-13 DIAGNOSIS — Z1389 Encounter for screening for other disorder: Secondary | ICD-10-CM

## 2015-07-13 DIAGNOSIS — Z3A40 40 weeks gestation of pregnancy: Secondary | ICD-10-CM

## 2015-07-13 NOTE — Patient Instructions (Signed)
Call the office [phone]) or go to [facility] if:  You begin to have strong, frequent contractions  Your water breaks.  Sometimes it is a big gush of fluid, sometimes it is just a trickle that keeps getting your panties wet or running down your legs  You have vaginal bleeding.  It is normal to have a small amount of spotting if your cervix was checked.   You don't feel your baby moving like normal.  If you don't, get you something to eat and drink and lay down and focus on feeling your baby move.  You should feel at least 10 movements in 2 hours.  If you don't, you should call the office or go to [facility].    Fetal Movement Counts Patient Name: __________________________________________________ Patient Due Date: ____________________ Performing a fetal movement count is highly recommended in high-risk pregnancies, but it is good for every pregnant woman to do. Your health care provider may ask you to start counting fetal movements at 28 weeks of the pregnancy. Fetal movements often increase:  After eating a full meal.  After physical activity.  After eating or drinking something sweet or cold.  At rest. Pay attention to when you feel the baby is most active. This will help you notice a pattern of your baby's sleep and wake cycles and what factors contribute to an increase in fetal movement. It is important to perform a fetal movement count at the same time each day when your baby is normally most active.  HOW TO COUNT FETAL MOVEMENTS  Find a quiet and comfortable area to sit or lie down on your left side. Lying on your left side provides the best blood and oxygen circulation to your baby.  Write down the day and time on a sheet of paper or in a journal.  Start counting kicks, flutters, swishes, rolls, or jabs in a 2-hour period. You should feel at least 10 movements within 2 hours.  If you do not feel 10 movements in 2 hours, wait 2-3 hours and count again. Look for a  change in the pattern or not enough counts in 2 hours. SEEK MEDICAL CARE IF:  You feel less than 10 counts in 2 hours, tried twice.  There is no movement in over an hour.  The pattern is changing or taking longer each day to reach 10 counts in 2 hours.  You feel the baby is not moving as he or she usually does. Date: ____________ Movements: ____________ Start time: ____________ Elizebeth Koller time: ____________  Date: ____________ Movements: ____________ Start time: ____________ Elizebeth Koller time: ____________ Date: ____________ Movements: ____________ Start time: ____________ Elizebeth Koller time: ____________ Date: ____________ Movements: ____________ Start time: ____________ Elizebeth Koller time: ____________ Date: ____________ Movements: ____________ Start time: ____________ Elizebeth Koller time: ____________ Date: ____________ Movements: ____________ Start time: ____________ Elizebeth Koller time: ____________ Date: ____________ Movements: ____________ Start time: ____________ Elizebeth Koller time: ____________ Date: ____________ Movements: ____________ Start time: ____________ Elizebeth Koller time: ____________  Date: ____________ Movements: ____________ Start time: ____________ Elizebeth Koller time: ____________ Date: ____________ Movements: ____________ Start time: ____________ Elizebeth Koller time: ____________ Date: ____________ Movements: ____________ Start time: ____________ Elizebeth Koller time: ____________ Date: ____________ Movements: ____________ Start time: ____________ Elizebeth Koller time: ____________ Date: ____________ Movements: ____________ Start time: ____________ Elizebeth Koller time: ____________ Date: ____________ Movements: ____________ Start time: ____________ Elizebeth Koller time: ____________ Date: ____________ Movements: ____________ Start time: ____________ Elizebeth Koller time: ____________  Date: ____________ Movements: ____________ Start time: ____________ Elizebeth Koller time: ____________ Date: ____________ Movements: ____________ Start time: ____________ Elizebeth Koller time: ____________ Date:  ____________  Movements: ____________ Start time: ____________ Doreatha Martin time: ____________ Date: ____________ Movements: ____________ Start time: ____________ Doreatha Martin time: ____________ Date: ____________ Movements: ____________ Start time: ____________ Doreatha Martin time: ____________ Date: ____________ Movements: ____________ Start time: ____________ Doreatha Martin time: ____________ Date: ____________ Movements: ____________ Start time: ____________ Doreatha Martin time: ____________  Date: ____________ Movements: ____________ Start time: ____________ Doreatha Martin time: ____________ Date: ____________ Movements: ____________ Start time: ____________ Doreatha Martin time: ____________ Date: ____________ Movements: ____________ Start time: ____________ Doreatha Martin time: ____________ Date: ____________ Movements: ____________ Start time: ____________ Doreatha Martin time: ____________ Date: ____________ Movements: ____________ Start time: ____________ Doreatha Martin time: ____________ Date: ____________ Movements: ____________ Start time: ____________ Doreatha Martin time: ____________ Date: ____________ Movements: ____________ Start time: ____________ Doreatha Martin time: ____________  Date: ____________ Movements: ____________ Start time: ____________ Doreatha Martin time: ____________ Date: ____________ Movements: ____________ Start time: ____________ Doreatha Martin time: ____________ Date: ____________ Movements: ____________ Start time: ____________ Doreatha Martin time: ____________ Date: ____________ Movements: ____________ Start time: ____________ Doreatha Martin time: ____________ Date: ____________ Movements: ____________ Start time: ____________ Doreatha Martin time: ____________ Date: ____________ Movements: ____________ Start time: ____________ Doreatha Martin time: ____________ Date: ____________ Movements: ____________ Start time: ____________ Doreatha Martin time: ____________  Date: ____________ Movements: ____________ Start time: ____________ Doreatha Martin time: ____________ Date: ____________ Movements: ____________ Start  time: ____________ Doreatha Martin time: ____________ Date: ____________ Movements: ____________ Start time: ____________ Doreatha Martin time: ____________ Date: ____________ Movements: ____________ Start time: ____________ Doreatha Martin time: ____________ Date: ____________ Movements: ____________ Start time: ____________ Doreatha Martin time: ____________ Date: ____________ Movements: ____________ Start time: ____________ Doreatha Martin time: ____________ Date: ____________ Movements: ____________ Start time: ____________ Doreatha Martin time: ____________  Date: ____________ Movements: ____________ Start time: ____________ Doreatha Martin time: ____________ Date: ____________ Movements: ____________ Start time: ____________ Doreatha Martin time: ____________ Date: ____________ Movements: ____________ Start time: ____________ Doreatha Martin time: ____________ Date: ____________ Movements: ____________ Start time: ____________ Doreatha Martin time: ____________ Date: ____________ Movements: ____________ Start time: ____________ Doreatha Martin time: ____________ Date: ____________ Movements: ____________ Start time: ____________ Doreatha Martin time: ____________ Date: ____________ Movements: ____________ Start time: ____________ Doreatha Martin time: ____________  Date: ____________ Movements: ____________ Start time: ____________ Doreatha Martin time: ____________ Date: ____________ Movements: ____________ Start time: ____________ Doreatha Martin time: ____________ Date: ____________ Movements: ____________ Start time: ____________ Doreatha Martin time: ____________ Date: ____________ Movements: ____________ Start time: ____________ Doreatha Martin time: ____________ Date: ____________ Movements: ____________ Start time: ____________ Doreatha Martin time: ____________ Date: ____________ Movements: ____________ Start time: ____________ Doreatha Martin time: ____________   This information is not intended to replace advice given to you by your health care provider. Make sure you discuss any questions you have with your health care provider.   Document  Released: 02/21/2006 Document Revised: 02/12/2014 Document Reviewed: 11/19/2011 Elsevier Interactive Patient Education 2016 Elsevier Inc.   [employer] of the uterus can occur throughout pregnancy. Contractions are not always a sign that you are in labor.  WHAT ARE BRAXTON HICKS CONTRACTIONS?  Contractions that occur before labor are called Braxton Hicks contractions, or false labor. Toward the end of pregnancy (32-34 weeks), these contractions can develop more often and may become more forceful. This is not true labor because these contractions do not result in opening (dilatation) and thinning of the cervix. They are sometimes difficult to tell apart from true labor because these contractions can be forceful and people have different pain tolerances. You should not feel embarrassed if you go to the hospital with false labor. Sometimes, the only way to tell if you are in true labor is for your health care provider to look for changes in the cervix. If there are no prenatal problems or other health problems associated with the pregnancy,  it is completely safe to be sent home with false labor and await the onset of true labor. HOW CAN YOU TELL THE DIFFERENCE BETWEEN TRUE AND FALSE LABOR? False Labor  The contractions of false labor are usually shorter and not as hard as those of true labor.   The contractions are usually irregular.   The contractions are often felt in the front of the lower abdomen and in the groin.   The contractions may go away when you walk around or change positions while lying down.   The contractions get weaker and are shorter lasting as time goes on.   The contractions do not usually become progressively stronger, regular, and closer together as with true labor.  True Labor  Contractions in true labor last 30-70 seconds, become very regular, usually become more intense, and increase in frequency.   The contractions do not go away  with walking.   The discomfort is usually felt in the top of the uterus and spreads to the lower abdomen and low back.   True labor can be determined by your health care provider with an exam. This will show that the cervix is dilating and getting thinner.  WHAT TO REMEMBER  Keep up with your usual exercises and follow other instructions given by your health care provider.   Take medicines as directed by your health care provider.   Keep your regular prenatal appointments.   Eat and drink lightly if you think you are going into labor.   If Braxton Hicks contractions are making you uncomfortable:   Change your position from lying down or resting to walking, or from walking to resting.   Sit and rest in a tub of warm water.   Drink 2-3 glasses of water. Dehydration may cause these contractions.   Do slow and deep breathing several times an hour.  WHEN SHOULD I SEEK IMMEDIATE MEDICAL CARE? Seek immediate medical care if:  Your contractions become stronger, more regular, and closer together.   You have fluid leaking or gushing from your vagina.   You have a fever.   You pass blood-tinged mucus.   You have vaginal bleeding.   You have continuous abdominal pain.   You have low back pain that you never had before.   You feel your baby's head pushing down and causing pelvic pressure.   Your baby is not moving as much as it used to.    This information is not intended to replace advice given to you by your health care provider. Make sure you discuss any questions you have with your health care provider.   Document Released: 01/22/2005 Document Revised: 01/27/2013 Document Reviewed: 11/03/2012 Elsevier Interactive Patient Education [employer]2016 Elsevier Inc.

## 2015-07-13 NOTE — Progress Notes (Addendum)
Work-in Low-risk OB appointment G1P0000 7769w3d Estimated Date of Delivery: 07/17/15 BP 104/60 mmHg  Pulse 100  Wt 142 lb (64.411 kg)  LMP 10/10/2014 (Exact Date)  BP, weight, and urine reviewed.  Refer to obstetrical flow sheet for FH & FHR.  Reports decreased fm today.  Denies lof, vb, or uti s/s. Irregular uc's, mucousy d/c w/ blood in it today.  NST: initially only 1 15x15 w/ multiple 10x10 accels, was going to work-in for BPP, but while waiting had multiple 15x15s and pt reports improved movement Irregular uc's q 2-815mins SVE per request: 3.5/80/-2, vtx, appears to be losing mucous plug- very thick mucous w/ blood tinged streaks, no lof Reviewed labor s/s-possibly in early labor, fkc. Plan:  Continue routine obstetrical care  F/U as scheduled for OB appointment

## 2015-07-16 ENCOUNTER — Encounter (HOSPITAL_COMMUNITY): Payer: Self-pay | Admitting: General Practice

## 2015-07-16 ENCOUNTER — Inpatient Hospital Stay (HOSPITAL_COMMUNITY): Payer: Medicaid Other | Admitting: Anesthesiology

## 2015-07-16 ENCOUNTER — Inpatient Hospital Stay (HOSPITAL_COMMUNITY)
Admission: AD | Admit: 2015-07-16 | Discharge: 2015-07-19 | DRG: 775 | Disposition: A | Discharge status: Home / Self Care | Payer: Medicaid Other | Source: Ambulatory Visit | Attending: Family Medicine | Admitting: Family Medicine

## 2015-07-16 DIAGNOSIS — O4202 Full-term premature rupture of membranes, onset of labor within 24 hours of rupture: Secondary | ICD-10-CM | POA: Diagnosis present

## 2015-07-16 DIAGNOSIS — Z87891 Personal history of nicotine dependence: Secondary | ICD-10-CM

## 2015-07-16 DIAGNOSIS — O4290 Premature rupture of membranes, unspecified as to length of time between rupture and onset of labor, unspecified weeks of gestation: Secondary | ICD-10-CM | POA: Diagnosis present

## 2015-07-16 DIAGNOSIS — F1290 Cannabis use, unspecified, uncomplicated: Secondary | ICD-10-CM | POA: Diagnosis present

## 2015-07-16 DIAGNOSIS — O99824 Streptococcus B carrier state complicating childbirth: Secondary | ICD-10-CM | POA: Diagnosis present

## 2015-07-16 DIAGNOSIS — Z3A39 39 weeks gestation of pregnancy: Secondary | ICD-10-CM

## 2015-07-16 DIAGNOSIS — Z6791 Unspecified blood type, Rh negative: Secondary | ICD-10-CM | POA: Diagnosis not present

## 2015-07-16 DIAGNOSIS — O26893 Other specified pregnancy related conditions, third trimester: Secondary | ICD-10-CM | POA: Diagnosis present

## 2015-07-16 DIAGNOSIS — O99324 Drug use complicating childbirth: Secondary | ICD-10-CM | POA: Diagnosis present

## 2015-07-16 DIAGNOSIS — Z825 Family history of asthma and other chronic lower respiratory diseases: Secondary | ICD-10-CM

## 2015-07-16 DIAGNOSIS — Z3A40 40 weeks gestation of pregnancy: Secondary | ICD-10-CM | POA: Diagnosis not present

## 2015-07-16 LAB — CBC
HEMATOCRIT: 35.2 % — AB (ref 36.0–46.0)
Hemoglobin: 12.4 g/dL (ref 12.0–15.0)
MCH: 33.3 pg (ref 26.0–34.0)
MCHC: 35.2 g/dL (ref 30.0–36.0)
MCV: 94.6 fL (ref 78.0–100.0)
Platelets: 150 10*3/uL (ref 150–400)
RBC: 3.72 MIL/uL — ABNORMAL LOW (ref 3.87–5.11)
RDW: 13.1 % (ref 11.5–15.5)
WBC: 5.7 10*3/uL (ref 4.0–10.5)

## 2015-07-16 MED ORDER — ONDANSETRON HCL 4 MG/2ML IJ SOLN
4.0000 mg | Freq: Four times a day (QID) | INTRAMUSCULAR | Status: DC | PRN
Start: 2015-07-16 — End: 2015-07-17

## 2015-07-16 MED ORDER — OXYTOCIN BOLUS FROM INFUSION
500.0000 mL | INTRAVENOUS | Status: DC
  Administered 2015-07-17: 500 mL via INTRAVENOUS

## 2015-07-16 MED ORDER — FLEET ENEMA 7-19 GM/118ML RE ENEM: 1.0000 | ENEMA | RECTAL | Status: DC | PRN

## 2015-07-16 MED ORDER — LACTATED RINGERS IV SOLN
INTRAVENOUS | Status: DC
  Administered 2015-07-16: 20:00:00 via INTRAVENOUS

## 2015-07-16 MED ORDER — LIDOCAINE HCL (PF) 1 % IJ SOLN
30.0000 mL | INTRAMUSCULAR | Status: DC | PRN
  Filled 2015-07-16: qty 30

## 2015-07-16 MED ORDER — EPHEDRINE 5 MG/ML INJ
10.0000 mg | INTRAVENOUS | Status: DC | PRN
  Filled 2015-07-16: qty 2

## 2015-07-16 MED ORDER — LIDOCAINE HCL (PF) 1 % IJ SOLN
INTRAMUSCULAR | Status: DC | PRN
  Administered 2015-07-16 (×2): 6 mL via EPIDURAL

## 2015-07-16 MED ORDER — PHENYLEPHRINE 40 MCG/ML (10ML) SYRINGE FOR IV PUSH (FOR BLOOD PRESSURE SUPPORT)
80.0000 ug | PREFILLED_SYRINGE | INTRAVENOUS | Status: DC | PRN
  Filled 2015-07-16: qty 5

## 2015-07-16 MED ORDER — LACTATED RINGERS IV SOLN: 500.0000 mL | INTRAVENOUS | Status: DC | PRN

## 2015-07-16 MED ORDER — FENTANYL CITRATE (PF) 100 MCG/2ML IJ SOLN
50.0000 ug | INTRAMUSCULAR | Status: DC | PRN
  Administered 2015-07-16: 100 ug via INTRAVENOUS
  Filled 2015-07-16: qty 2

## 2015-07-16 MED ORDER — PHENYLEPHRINE 40 MCG/ML (10ML) SYRINGE FOR IV PUSH (FOR BLOOD PRESSURE SUPPORT)
PREFILLED_SYRINGE | INTRAVENOUS | Status: AC
  Filled 2015-07-16: qty 20

## 2015-07-16 MED ORDER — PENICILLIN G POTASSIUM 5000000 UNITS IJ SOLR
2.5000 10*6.[IU] | INTRAVENOUS | Status: DC
  Filled 2015-07-16: qty 2.5

## 2015-07-16 MED ORDER — SOD CITRATE-CITRIC ACID 500-334 MG/5ML PO SOLN: 30.0000 mL | ORAL | Status: DC | PRN

## 2015-07-16 MED ORDER — DIPHENHYDRAMINE HCL 50 MG/ML IJ SOLN: 12.5000 mg | INTRAMUSCULAR | Status: DC | PRN

## 2015-07-16 MED ORDER — OXYCODONE-ACETAMINOPHEN 5-325 MG PO TABS: 2.0000 | ORAL_TABLET | ORAL | Status: DC | PRN

## 2015-07-16 MED ORDER — PENICILLIN G POTASSIUM 5000000 UNITS IJ SOLR
5.0000 10*6.[IU] | Freq: Once | INTRAVENOUS | Status: DC
  Filled 2015-07-16: qty 5

## 2015-07-16 MED ORDER — FENTANYL 2.5 MCG/ML BUPIVACAINE 1/10 % EPIDURAL INFUSION (WH - ANES)
INTRAMUSCULAR | Status: AC
  Filled 2015-07-16: qty 125

## 2015-07-16 MED ORDER — ACETAMINOPHEN 325 MG PO TABS: 650.0000 mg | ORAL_TABLET | ORAL | Status: DC | PRN

## 2015-07-16 MED ORDER — PHENYLEPHRINE 40 MCG/ML (10ML) SYRINGE FOR IV PUSH (FOR BLOOD PRESSURE SUPPORT)
80.0000 ug | PREFILLED_SYRINGE | INTRAVENOUS | Status: DC | PRN
  Administered 2015-07-16 – 2015-07-17 (×2): 80 ug via INTRAVENOUS
  Filled 2015-07-16: qty 5

## 2015-07-16 MED ORDER — HYDROXYZINE HCL 50 MG PO TABS
50.0000 mg | ORAL_TABLET | Freq: Four times a day (QID) | ORAL | Status: DC | PRN
  Filled 2015-07-16: qty 1

## 2015-07-16 MED ORDER — OXYCODONE-ACETAMINOPHEN 5-325 MG PO TABS: 1.0000 | ORAL_TABLET | ORAL | Status: DC | PRN

## 2015-07-16 MED ORDER — LACTATED RINGERS IV SOLN: 500.0000 mL | Freq: Once | INTRAVENOUS | Status: DC

## 2015-07-16 MED ORDER — FENTANYL 2.5 MCG/ML BUPIVACAINE 1/10 % EPIDURAL INFUSION (WH - ANES)
14.0000 mL/h | INTRAMUSCULAR | Status: DC | PRN
  Administered 2015-07-16 (×2): 14 mL/h via EPIDURAL

## 2015-07-16 MED ORDER — OXYTOCIN 40 UNITS IN LACTATED RINGERS INFUSION - SIMPLE MED
2.5000 [IU]/h | INTRAVENOUS | Status: DC
  Filled 2015-07-16: qty 1000

## 2015-07-16 NOTE — Progress Notes (Signed)
Labor Progress Note Johna RolesXavier L Clippinger is a 20 y.o. G1P0000 at 4932w6d presented for PROM S: no complaint. Pain well controled by as needed meds for now. Interested in Epidural.  O:  BP 103/69 mmHg  Pulse 90  Temp(Src) 98 F (36.7 C) (Oral)  Resp 18  Ht 4\' 10"  (1.473 m)  Wt 140 lb 9.6 oz (63.776 kg)  BMI 29.39 kg/m2  LMP 10/10/2014 (Exact Date) EFM: 150/mod var/+accels/no decels  CVE: Dilation: 4 Effacement (%): 80 Cervical Position: Posterior Station: -1 Presentation: Vertex Exam by:: Yetta Barre. Jones, Rn   A&P: 20 y.o. G1P0000 2332w6d admitted due to PROM at 1830 #Labor: progressing  #Pain: as needed meds. Epidural upon request #FWB: CAT-1 #GBS: PCN  Almon Herculesaye T Hall Birchard, MD 10:28 PM

## 2015-07-16 NOTE — Anesthesia Pain Management Evaluation Note (Signed)
  CRNA Pain Management Visit Note  Patient: Jacqueline Koch, 20 y.o., female  "Hello I am a [facility] of the anesthesia [facility]. We have an anesthesia team available at all times to provide care throughout the hospital, including epidural management and anesthesia for C-section. I don't know your plan for the delivery whether it a natural birth, water birth, IV sedation, nitrous supplementation, doula or epidural, but we want to meet your pain goals."   1.Was your pain managed to your expectations on prior hospitalizations?   No prior hospitalizations  2.What is your expectation for pain management during this hospitalization?     Epidural  3.How can we help you reach that goal?   Record the patient's initial score and the patient's pain goal.   Pain: 6  Pain Goal: 9 [facility] wants you to be able to say your pain was always managed very well.  Jacqueline Koch 07/16/2015

## 2015-07-16 NOTE — Anesthesia Procedure Notes (Signed)
Epidural Patient location during procedure: OB Start time: 07/16/2015 11:18 PM End time: 07/16/2015 11:22 PM  Staffing Anesthesiologist: Leilani AbleHATCHETT, Emitt Maglione Performed by: anesthesiologist   Preanesthetic Checklist Completed: patient identified, surgical consent, pre-op evaluation, timeout performed, IV checked, risks and benefits discussed and monitors and equipment checked  Epidural Patient position: sitting Prep: site prepped and draped and DuraPrep Patient monitoring: continuous pulse ox and blood pressure Approach: midline Location: L3-L4 Injection technique: LOR air  Needle:  Needle type: Tuohy  Needle gauge: 17 G Needle length: 9 cm and 9 Needle insertion depth: 7 cm Catheter type: closed end flexible Catheter size: 19 Gauge Catheter at skin depth: 12 cm Test dose: negative and Other  Assessment Events: blood not aspirated, injection not painful, no injection resistance, negative IV test and no paresthesia  Additional Notes Reason for block:procedure for pain

## 2015-07-16 NOTE — MAU Note (Addendum)
Leaking clear fld for about . No pain currently. 3cm last sve. Pt to BR from Triage to change clothes and put on gown, pad, and panties.

## 2015-07-16 NOTE — Anesthesia Preprocedure Evaluation (Signed)
Anesthesia Evaluation  Patient identified by MRN, date of birth, ID band Patient awake    Reviewed: Allergy & Precautions, H&P , NPO status , Patient's Chart, lab work & pertinent test results  Airway Mallampati: I  TM Distance: >3 FB Neck ROM: full    Dental no notable dental hx.    Pulmonary former smoker,    Pulmonary exam normal        Cardiovascular negative cardio ROS Normal cardiovascular exam     Neuro/Psych negative neurological ROS  negative psych ROS   GI/Hepatic negative GI ROS, Neg liver ROS,   Endo/Other  negative endocrine ROS  Renal/GU negative Renal ROS     Musculoskeletal   Abdominal Normal abdominal exam  (+)   Peds  Hematology negative hematology ROS (+)   Anesthesia Other Findings   Reproductive/Obstetrics (+) Pregnancy                             Anesthesia Physical Anesthesia Plan  ASA: II  Anesthesia Plan: Epidural   Post-op Pain Management:    Induction:   Airway Management Planned:   Additional Equipment:   Intra-op Plan:   Post-operative Plan:   Informed Consent: I have reviewed the patients History and Physical, chart, labs and discussed the procedure including the risks, benefits and alternatives for the proposed anesthesia with the patient or authorized representative who has indicated his/her understanding and acceptance.     Plan Discussed with:   Anesthesia Plan Comments:         Anesthesia Quick Evaluation  

## 2015-07-16 NOTE — H&P (Signed)
Jacqueline Koch is a 20 y.o. G1P0000 female at 3620w6d by LMP c/w 6wk u/s, presenting w/ report of leakage of clear fluid since 1830.   Reports active fetal movement, contractions: irregular, vaginal bleeding: none, membranes: ruptured, clear fluid. Initiated prenatal care at [facility] at 8 wks.   Most recent u/s @ 28wks to recheck spine not seen at anatomy scan- normal spine, afi normal, efw 51% .   This pregnancy complicated by: GBS bacteruria Rh neg, received rhogam 05/24/15 THC+ early pregnancy, repeat neg Varicella non-imm  Prenatal History/Complications:  nulliparous  Past Medical History: Past Medical History  Diagnosis Date  . Nexplanon removal 06/09/2014  . Pregnant 11/10/2014    Past Surgical History: History reviewed. No pertinent past surgical history.  Obstetrical History: OB History    Gravida Para Term Preterm AB TAB SAB Ectopic Multiple Living   1 0 0 0 0 0 0 0 0 0       Social History: Social History   Social History  . Marital Status: Single    Spouse Name: N/A  . Number of Children: N/A  . Years of Education: in 10th   Occupational History  . student    Social History Main Topics  . Smoking status: Former Smoker -- 1 years    Types: Cigarettes  . Smokeless tobacco: Never Used  . Alcohol Use: No  . Drug Use: No  . Sexual Activity: Yes    Birth Control/ Protection: None   Other Topics Concern  . None   Social History Narrative    Family History: Family History  Problem Relation Age of Onset  . Asthma Brother   . Arthritis Paternal Grandmother   . Crohn's disease Maternal Grandmother     Allergies: No Known Allergies  Prescriptions prior to admission  Medication Sig Dispense Refill Last Dose  . ferrous sulfate 325 (65 FE) MG tablet Take 1 tablet (325 mg total) by mouth 2 (two) times daily with a meal. 60 tablet 3 Taking  . Prenat w/o A-[id number]-[id number]-FA-DHA (CITRANATAL 90 DHA) 90-1 & 300 MG MISC Take 1 bid 60 each 11 Taking    Review of Systems   Pertinent pos/neg as indicated in HPI  Blood pressure 103/69, pulse 90, temperature 98 F (36.7 C), temperature source Oral, resp. rate 18, height 4\' 10"  (1.473 m), weight 63.776 kg (140 lb 9.6 oz), last menstrual period 10/10/2014. General appearance: alert, cooperative and no distress Lungs: clear to auscultation bilaterally Heart: regular rate and rhythm Abdomen: gravid, soft, non-tender Extremities: trace edema DTR's 2+  Fetal monitoring: FHR: 155 bpm, variability: moderate,  Accelerations: Present,  decelerations:  Absent Uterine activity: q 2-723mins, mild   3cm in office on Wed, will defer SVE at this time  Presentation: cephalic   Prenatal labs: ABO, Rh: A/Negative/-- (11/01 1229) Antibody: Negative (03/21 0913) Rubella: !Error! RPR: Non Reactive (03/21 0913)  HBsAg: Negative (11/01 1229)  HIV: Non Reactive (03/21 0913)  GBS:   Pos urine  2 hr GTT: 83/117/109 Genetic screening:  Nt/it neg Anatomy US: normal female  No results found for this or any previous visit (from the past 24 hour(s)).   Assessment:  7420w6d SIUP  G1P0000  PROM, defer SVE until painful uc's/requesting meds/epidural  Cat 1 FHR  GBS  + urine  Plan:  Admit to BS  IV pain meds/epidural prn active labor  PCN for GBS+ urine  Expectant management, will consider augment if no labor 12hr from ROM  Anticipate NSVB   Plans to breast/bottlefeed  Contraception: nexplanon  Circumcision: n/a  Marge Duncans CNM, WHNP-BC 07/16/2015, 8:00 PM

## 2015-07-17 ENCOUNTER — Encounter (HOSPITAL_COMMUNITY): Payer: Self-pay | Admitting: General Practice

## 2015-07-17 DIAGNOSIS — O99824 Streptococcus B carrier state complicating childbirth: Secondary | ICD-10-CM

## 2015-07-17 DIAGNOSIS — Z3A40 40 weeks gestation of pregnancy: Secondary | ICD-10-CM

## 2015-07-17 LAB — HIV ANTIBODY (ROUTINE TESTING W REFLEX): HIV SCREEN 4TH GENERATION: NONREACTIVE

## 2015-07-17 LAB — RPR: RPR: NONREACTIVE

## 2015-07-17 MED ORDER — ONDANSETRON HCL 4 MG PO TABS: 4.0000 mg | ORAL_TABLET | ORAL | Status: DC | PRN

## 2015-07-17 MED ORDER — ZOLPIDEM TARTRATE 5 MG PO TABS: 5.0000 mg | ORAL_TABLET | Freq: Every evening | ORAL | Status: DC | PRN

## 2015-07-17 MED ORDER — DIPHENHYDRAMINE HCL 25 MG PO CAPS: 25.0000 mg | ORAL_CAPSULE | Freq: Four times a day (QID) | ORAL | Status: DC | PRN

## 2015-07-17 MED ORDER — SENNOSIDES-DOCUSATE SODIUM 8.6-50 MG PO TABS
2.0000 | ORAL_TABLET | ORAL | Status: DC
  Administered 2015-07-17 – 2015-07-18 (×2): 2 via ORAL
  Filled 2015-07-17 (×2): qty 2

## 2015-07-17 MED ORDER — WITCH HAZEL-GLYCERIN EX PADS: 1.0000 "application " | MEDICATED_PAD | CUTANEOUS | Status: DC | PRN

## 2015-07-17 MED ORDER — COCONUT OIL OIL: 1.0000 "application " | TOPICAL_OIL | Status: DC | PRN

## 2015-07-17 MED ORDER — MISOPROSTOL 200 MCG PO TABS
ORAL_TABLET | ORAL | Status: AC
  Administered 2015-07-17: 1000 ug
  Filled 2015-07-17: qty 5

## 2015-07-17 MED ORDER — IBUPROFEN 600 MG PO TABS
600.0000 mg | ORAL_TABLET | Freq: Four times a day (QID) | ORAL | Status: DC
  Administered 2015-07-17 – 2015-07-19 (×10): 600 mg via ORAL
  Filled 2015-07-17 (×10): qty 1

## 2015-07-17 MED ORDER — DIBUCAINE 1 % RE OINT: 1.0000 "application " | TOPICAL_OINTMENT | RECTAL | Status: DC | PRN

## 2015-07-17 MED ORDER — BENZOCAINE-MENTHOL 20-0.5 % EX AERO: 1.0000 "application " | INHALATION_SPRAY | CUTANEOUS | Status: DC | PRN

## 2015-07-17 MED ORDER — ONDANSETRON HCL 4 MG/2ML IJ SOLN: 4.0000 mg | INTRAMUSCULAR | Status: DC | PRN

## 2015-07-17 MED ORDER — SIMETHICONE 80 MG PO CHEW: 80.0000 mg | CHEWABLE_TABLET | ORAL | Status: DC | PRN

## 2015-07-17 MED ORDER — ACETAMINOPHEN 325 MG PO TABS: 650.0000 mg | ORAL_TABLET | ORAL | Status: DC | PRN

## 2015-07-17 MED ORDER — PRENATAL MULTIVITAMIN CH
1.0000 | ORAL_TABLET | Freq: Every day | ORAL | Status: DC
  Administered 2015-07-17 – 2015-07-19 (×3): 1 via ORAL
  Filled 2015-07-17 (×3): qty 1

## 2015-07-17 MED ORDER — TETANUS-DIPHTH-ACELL PERTUSSIS 5-2.5-18.5 LF-MCG/0.5 IM SUSP
0.5000 mL | Freq: Once | INTRAMUSCULAR | Status: AC
  Administered 2015-07-19: 0.5 mL via INTRAMUSCULAR
  Filled 2015-07-17: qty 0.5

## 2015-07-17 NOTE — Anesthesia Postprocedure Evaluation (Signed)
Anesthesia Post Note  Patient: Jacqueline Koch  Procedure(s) Performed: * No procedures listed *  Patient location during evaluation: Mother Baby Anesthesia Type: Epidural Level of consciousness: awake and alert Pain management: satisfactory to patient Vital Signs Assessment: post-procedure vital signs reviewed and stable Respiratory status: respiratory function stable Cardiovascular status: stable Postop Assessment: no headache, no backache, epidural receding, patient able to bend at knees, no signs of nausea or vomiting and adequate PO intake Anesthetic complications: no Comments: Comfort level was assessed by AnesthesiaTeam and the patient was pleased with the care, interventions, and services provided by the Department of Anesthesia.     Last Vitals:  Filed Vitals:   07/17/15 0615 07/17/15 0652  BP: 108/60 104/58  Pulse: 109 86  Temp:  36.9 C  Resp: 18 16    Last Pain:  Filed Vitals:   07/17/15 0840  PainSc: 0-No pain   Pain Goal:                 [name]

## 2015-07-18 LAB — CBC
HEMATOCRIT: 27.6 % — AB (ref 36.0–46.0)
Hemoglobin: 9.3 g/dL — ABNORMAL LOW (ref 12.0–15.0)
MCH: 33.5 pg (ref 26.0–34.0)
MCHC: 33.7 g/dL (ref 30.0–36.0)
MCV: 99.3 fL (ref 78.0–100.0)
PLATELETS: 139 10*3/uL — AB (ref 150–400)
RBC: 2.78 MIL/uL — AB (ref 3.87–5.11)
RDW: 12.5 % (ref 11.5–15.5)
WBC: 8.7 10*3/uL (ref 4.0–10.5)

## 2015-07-18 NOTE — Lactation Note (Signed)
This note was copied from a baby's chart. Lactation Consultation Note: Mom has been giving lots of bottles of formula. Did breast feed after delivery and once this morning. States she wants to breast and bottle feed. Baby had formula about 1 hour ago. Reports breast feeding hurt this morning. Reviewed wide open mouth and getting the baby deep onto the breast. Reviewed supply and demand and encouraged frequent nursing to promote a good milk supply. BF brochure given with resources for support after DC. Encouraged to page for assist when she is ready to breast feed. No questions at present.   Patient Name: Girl Doreene NestXavier Daywalt ZOXWR'UToday's Date: 07/18/2015 Reason for consult: Initial assessment   Maternal Data    Feeding    LATCH Score/Interventions                      Lactation Tools Discussed/Used     Consult Status Consult Status: PRN    Pamelia HoitWeeks, Yannely Kintzel D 07/18/2015, 10:10 AM

## 2015-07-18 NOTE — Progress Notes (Signed)
UR chart review completed.  

## 2015-07-18 NOTE — Clinical Social Work Maternal (Addendum)
  CLINICAL SOCIAL WORK MATERNAL/CHILD NOTE  Patient Details  Name: CENIYAH THORP MRN: [id number] Date of Birth: 01-15-1996  Date:  07/18/2015  Clinical Social Worker Initiating Note:  Laurey Arrow Date/ Time Initiated:  07/18/15/1300     Child's Name:  Jacqueline Koch   Legal Guardian:  Mother   Need for Interpreter:  None   Date of Referral:  07/18/15     Reason for Referral:  Current Substance Use/Substance Use During Pregnancy  (THC positive in early pregnancy)   Referral Source:  Central Nursery   Address:  [address] [place] [address]  Phone number:  [phone]   Household Members:  Self, Parents, Siblings   Natural Supports (not living in the home):  Immediate Family, Spouse/significant other   Professional Supports: None   Employment:     Type of Work:     Education:  Database administrator Resources:  Medicaid   Other Resources:  [facility]   Cultural/Religious Considerations Which May Impact Care:  none reported  Strengths:  Ability to meet basic needs , Home prepared for child , Pediatrician chosen    Risk Factors/Current Problems:  Substance Use    Cognitive State:  Alert , Linear Thinking , Insightful , Goal Oriented    Mood/Affect:  Bright , Calm , Comfortable , Relaxed    CSW Assessment:  CSW met with MOB for a consult for positive THC early in pregnancy.  MOB was inviting, polite, and engaged during the visit.  MOB had a room visitor when [place] arrived, and MOB acknowledged her as her biological mother [place]) and support. MOB gave CSW permission to speak with her in the presence of her mother.  CSW informed MOB of the hospital's drug screen policy, and informed MOB of the 2 screenings for the infant. MOB was understanding and admitted to utilizing marijuana early in pregnancy.  CSW informed MOB that the infant's UDS was negative and CSW is awaiting for cord screen results.  CSW informed MOB that a report will be  made to CPS only if there is a positive cord screen result.  MOB stated that she was not concerned and understood CSW concerns.  CSW also thanked MOB for being honest, and encouraged MOB to ask questions. CSW educated MOB about PPD.  CSW informed MOB of possible supports and interventions to decrease PPD.  CSW also encouraged MOB to seek medical attention if needed for increased signs and symptoms for PPD. MOB appeared knowledgeable about PPD and denied any feelings of sadness, HI or SI. CSW also reviewed safe sleep, and SIDS with MOB. MOB was knowledgeable about SIDS, and asked very appropriate questions. MOB communicated that she has a crib, and she feels prepared with being able to meet her infant's needs. MOB did not have any questions or concerns at this time  CSW Plan/Description:  No Further Intervention Required/No Barriers to Discharge, Patient/Family Education  (A report will be made to CPS if cord screen is positive.)    Elmer Boutelle D BOYD-GILYARD, LCSW 07/18/2015, 2:41 PM

## 2015-07-18 NOTE — Progress Notes (Signed)
Post Partum Day 1 Subjective: no complaints, up ad lib, voiding and tolerating PO  Objective: Blood pressure 98/53, pulse 66, temperature 98.1 F (36.7 C), temperature source Oral, resp. rate 18, height 4\' 10"  (1.473 m), weight 140 lb 9.6 oz (63.776 kg), last menstrual period 10/10/2014, SpO2 99 %, unknown if currently breastfeeding.  Physical Exam:  General: alert, cooperative and no distress Lochia: appropriate Uterine Fundus: firm Incision: n/a DVT Evaluation: No evidence of DVT seen on physical exam. Negative Homan's sign. No cords or calf tenderness. No significant calf/ankle edema.   Recent Labs  07/16/15 2010  HGB 12.4  HCT 35.2*    Assessment/Plan: Plan for discharge tomorrow   LOS: 2 days   Jacqueline Koch 07/18/2015, 3:58 AM

## 2015-07-19 ENCOUNTER — Encounter: Payer: Medicaid Other | Admitting: Advanced Practice Midwife

## 2015-07-19 MED ORDER — ACETAMINOPHEN 325 MG PO TABS: 650.0000 mg | ORAL_TABLET | ORAL | Status: DC | PRN

## 2015-07-19 MED ORDER — IBUPROFEN 600 MG PO TABS: 600.0000 mg | ORAL_TABLET | Freq: Four times a day (QID) | ORAL | Status: DC

## 2015-07-19 NOTE — Discharge Summary (Signed)
OB Discharge Summary     Patient Name: Jacqueline Koch DOB: 02-Sep-1995 MRN: [id number]  Date of admission: 07/16/2015 Delivering MD: Shawna ClampBOOKER, KIMBERLY R   Date of discharge: 07/19/2015  Admitting diagnosis: 40 WKS, WATER BROKE Intrauterine pregnancy: 8629w0d     Secondary diagnosis:  Active Problems:   Leakage, amniotic fluid   NSVD (normal spontaneous vaginal delivery)  Additional problems: none     Discharge diagnosis: Term Pregnancy Delivered                                                                                                Post partum procedures:none  Augmentation: Pitocin  Complications: None  Hospital course:  Onset of Labor With Vaginal Delivery     20 y.o. yo G1P1001 at 5929w0d was admitted in Active Labor on 07/16/2015. Patient had an uncomplicated labor course as follows:  Membrane Rupture Time/Date: 6:30 PM ,07/16/2015   Intrapartum Procedures: Episiotomy: None [1]                                         Lacerations:  None [1]  Patient had a delivery of a Viable infant. 07/17/2015  Information for the patient's newborn:  Wanita ChamberlainBrim, Girl Jess BartersXavier [[id number]][[id number]]  Delivery Method: Vaginal, Spontaneous Delivery (Filed from Delivery Summary)    Pateint had an uncomplicated postpartum course.  She is ambulating, tolerating a regular diet, passing flatus, and urinating well. Patient is discharged home in stable condition on 07/19/2015.    Physical exam  Filed Vitals:   07/17/15 1812 07/18/15 0600 07/18/15 1800 07/19/15 0506  BP: 98/53 90/56 100/65 105/59  Pulse: 66 71 75 75  Temp: 98.1 F (36.7 C) 98 F (36.7 C) 98 F (36.7 C) 97.9 F (36.6 C)  TempSrc: Oral Oral Oral Oral  Resp: 18 18 18 18   Height:      Weight:      SpO2:       General: alert, cooperative and no distress Lochia: appropriate Uterine Fundus: firm Incision: N/A DVT Evaluation: No evidence of DVT seen on physical exam. Labs: Lab Results  Component Value Date   WBC 8.7 07/18/2015   HGB  9.3* 07/18/2015   HCT 27.6* 07/18/2015   MCV 99.3 07/18/2015   PLT 139* 07/18/2015   CMP Latest Ref Rng 08/25/2013  Glucose 70 - 99 mg/dL 82  BUN 6 - 23 mg/dL 11  Creatinine 7.820.50 - 9.561.10 mg/dL 2.130.63  Sodium 086137 - 578147 mEq/L 139  Potassium 3.7 - 5.3 mEq/L 4.4  Chloride 96 - 112 mEq/L 102  CO2 19 - 32 mEq/L 26  Calcium 8.4 - 10.5 mg/dL 9.8  Total Protein 6.0 - 8.3 g/dL 7.9  Total Bilirubin 0.3 - 1.2 mg/dL 0.9  Alkaline Phos 39 - 117 U/L 108  AST 0 - 37 U/L 16  ALT 0 - 35 U/L 9    Discharge instruction: per After Visit Summary and "Baby and Me Booklet".  After visit meds:    Medication List  TAKE these medications        acetaminophen 325 MG tablet  Commonly known as:  TYLENOL  Take 2 tablets (650 mg total) by mouth every 4 (four) hours as needed (for pain scale < 4).     CITRANATAL 90 DHA 90-1 & 300 MG Misc  Take 1 bid     ferrous sulfate 325 (65 FE) MG tablet  Take 1 tablet (325 mg total) by mouth 2 (two) times daily with a meal.     ibuprofen 600 MG tablet  Commonly known as:  ADVIL,MOTRIN  Take 1 tablet (600 mg total) by mouth every 6 (six) hours.        Diet: routine diet  Activity: Advance as tolerated. Pelvic rest for 6 weeks.   Outpatient follow up:6 weeks Follow up Appt:Future Appointments Date Time Provider Department Center  08/17/2015 11:00 AM Jacklyn Shell, CNM [facility]   Follow up Visit:No Follow-up on file.  Postpartum contraception: Nexplanon  Newborn Data: Live born female  Birth Weight: 6 lb 14.8 oz (3140 g) APGAR: 9, 9  Baby Feeding: Bottle and Breast Disposition:home with mother   07/19/2015 Almon Hercules, MD   OB FELLOW DISCHARGE ATTESTATION  I have seen and examined this patient and agree with above documentation in the resident's note.   Silvano Bilis, MD 6:09 PM

## 2015-07-19 NOTE — Discharge Instructions (Signed)

## 2015-07-20 ENCOUNTER — Telehealth: Payer: Self-pay | Admitting: *Deleted

## 2015-07-20 LAB — TYPE AND SCREEN
ABO/RH(D): A NEG
ANTIBODY SCREEN: POSITIVE
DAT, IgG: NEGATIVE
UNIT DIVISION: 0
UNIT DIVISION: 0

## 2015-07-20 NOTE — Telephone Encounter (Signed)
[facility] stating pt HGB 10.0, SVD 07/17/2015. Pt is taking PNV and 2 iron tablets daily.

## 2015-08-17 ENCOUNTER — Ambulatory Visit (INDEPENDENT_AMBULATORY_CARE_PROVIDER_SITE_OTHER): Payer: Medicaid Other | Admitting: Advanced Practice Midwife

## 2015-08-17 ENCOUNTER — Encounter: Payer: Self-pay | Admitting: Advanced Practice Midwife

## 2015-08-17 NOTE — Progress Notes (Signed)
  Jacqueline Koch is a 20 y.o. who presents for a postpartum visit. She is 4 weeks postpartum following a spontaneous vaginal delivery. I have fully reviewed the prenatal and intrapartum course. The delivery was at 40 gestational weeks.  Anesthesia: epidural. Postpartum course has been uneventful. Baby's course has been uneventful. Baby is feeding by bottle. Bleeding: staining only. Bowel function is normal. Bladder function is normal. Patient is not sexually active. Contraception method is none. Postpartum depression screening: negative.   Current outpatient prescriptions:  .  ibuprofen (ADVIL,MOTRIN) 600 MG tablet, Take 1 tablet (600 mg total) by mouth every 6 (six) hours., Disp: 30 tablet, Rfl: 0  Review of Systems   Constitutional: Negative for fever and chills Eyes: Negative for visual disturbances Respiratory: Negative for shortness of breath, dyspnea Cardiovascular: Negative for chest pain or palpitations  Gastrointestinal: Negative for vomiting, diarrhea and constipation Genitourinary: Negative for dysuria and urgency Musculoskeletal: Negative for back pain, joint pain, myalgias  Neurological: Negative for dizziness and headaches   Objective:     Filed Vitals:   08/17/15 1103  BP: 98/74  Pulse: 80   General:  alert, cooperative and no distress   Breasts:  negative  Lungs: clear to auscultation bilaterally  Heart:  regular rate and rhythm  Abdomen: Soft, nontender   Vulva:  normal  Vagina: normal vagina  Cervix:  closed  Corpus: Well involuted     Rectal Exam: no hemorrhoids        Assessment:    normal postpartum exam.  Plan:    1. Contraception: Nexplanon 2. Follow up in: asap for Nexplanon or as needed.

## 2015-08-22 ENCOUNTER — Ambulatory Visit (INDEPENDENT_AMBULATORY_CARE_PROVIDER_SITE_OTHER): Payer: Medicaid Other | Admitting: Women's Health

## 2015-08-22 ENCOUNTER — Encounter: Payer: Self-pay | Admitting: Women's Health

## 2015-08-22 VITALS — BP 102/58 | HR 78 | Ht <= 58 in | Wt 125.0 lb

## 2015-08-22 DIAGNOSIS — Z3046 Encounter for surveillance of implantable subdermal contraceptive: Secondary | ICD-10-CM

## 2015-08-22 DIAGNOSIS — Z3202 Encounter for pregnancy test, result negative: Secondary | ICD-10-CM

## 2015-08-22 DIAGNOSIS — Z30017 Encounter for initial prescription of implantable subdermal contraceptive: Secondary | ICD-10-CM | POA: Insufficient documentation

## 2015-08-22 HISTORY — DX: Encounter for initial prescription of implantable subdermal contraceptive: Z30.017

## 2015-08-22 LAB — POCT URINE PREGNANCY: Preg Test, Ur: NEGATIVE

## 2015-08-22 NOTE — Patient Instructions (Signed)

## 2015-08-22 NOTE — Progress Notes (Signed)
Patient ID: Jacqueline RolesXavier L Stainback, female   DOB: Aug 17, 1995, 20 y.o.   MRN: [id number] Jacqueline Koch is a 20 y.o. year old African American female here for Nexplanon insertion. She is 4wks s/p SVB.  Patient's last menstrual period was 08/21/2015., last sexual intercourse was prior to birth of baby, and her pregnancy test today was negative.  Risks/benefits/side effects of Nexplanon have been discussed and her questions have been answered.  Specifically, a failure rate of 02/998 has been reported, with an increased failure rate if pt takes St. John's Wort and/or antiseizure medicaitons.  Jacqueline RolesXavier L Mckoy is aware of the common side effect of irregular bleeding, which the incidence of decreases over time. She has had an Implanon and a Nexplanon before and didn't have any problems w/ either one.   BP 102/58 mmHg  Pulse 78  Ht 4\' 10"  (1.473 m)  Wt 125 lb (56.7 kg)  BMI 26.13 kg/m2  LMP 08/21/2015  Breastfeeding? No  Results for orders placed or performed in visit on 08/22/15 (from the past 24 hour(s))  POCT urine pregnancy   Collection Time: 08/22/15  3:43 PM  Result Value Ref Range   Preg Test, Ur Negative Negative     She is right-handed, so her left arm, approximately 4 inches proximal from the elbow, was cleansed with alcohol and anesthetized with 2cc of 2% Lidocaine.  The area was cleansed again with betadine and the Nexplanon was inserted per manufacturer's recommendations without difficulty.  A steri-strip and pressure bandage were applied.  Pt was instructed to keep the area clean and dry, remove pressure bandage in 24 hours, and keep insertion site covered with the steri-strip for 3-5 days.  Back up contraception was recommended for 2 weeks.  She was given a card indicating date Nexplanon was inserted and date it needs to be removed. Follow-up PRN problems, plan to come after 01/05/16 when she turns 20yo for pap & physical.   Marge DuncansBooker, Zanai Mallari Randall CNM, [facility] 08/22/2015 4:05 PM

## 2016-01-10 ENCOUNTER — Other Ambulatory Visit: Payer: Medicaid Other | Admitting: Women's Health

## 2016-01-16 ENCOUNTER — Encounter: Payer: Self-pay | Admitting: Women's Health

## 2016-01-16 ENCOUNTER — Encounter (INDEPENDENT_AMBULATORY_CARE_PROVIDER_SITE_OTHER): Payer: Self-pay

## 2016-01-16 ENCOUNTER — Ambulatory Visit (INDEPENDENT_AMBULATORY_CARE_PROVIDER_SITE_OTHER): Payer: Medicaid Other | Admitting: Women's Health

## 2016-01-16 ENCOUNTER — Other Ambulatory Visit (HOSPITAL_COMMUNITY)
Admission: RE | Admit: 2016-01-16 | Discharge: 2016-01-16 | Disposition: A | Discharge status: Home / Self Care | Payer: Medicaid Other | Source: Ambulatory Visit | Attending: Obstetrics & Gynecology | Admitting: Obstetrics & Gynecology

## 2016-01-16 VITALS — BP 92/56 | HR 80 | Ht 58.5 in | Wt 114.0 lb

## 2016-01-16 DIAGNOSIS — Z01419 Encounter for gynecological examination (general) (routine) without abnormal findings: Secondary | ICD-10-CM | POA: Diagnosis not present

## 2016-01-16 DIAGNOSIS — Z0000 Encounter for general adult medical examination without abnormal findings: Secondary | ICD-10-CM | POA: Diagnosis not present

## 2016-01-16 DIAGNOSIS — Z113 Encounter for screening for infections with a predominantly sexual mode of transmission: Secondary | ICD-10-CM | POA: Diagnosis present

## 2016-01-16 NOTE — Progress Notes (Signed)
Subjective:   Jacqueline Koch is a 20 y.o. 1021P1001 African American female here for a routine well-woman exam.  No LMP recorded. Patient has had an implant.    Current complaints: none PCP: none       Does not desire labs  Social History: Sexual: heterosexual Marital Status: dating Living situation: w/ parents Occupation: [employer] a couple of days a week Tobacco/alcohol: 2 cigarettes/day, etoh: special occasions Illicit drugs: no history of illicit drug use  The following portions of the patient's history were reviewed and updated as appropriate: allergies, current medications, past family history, past medical history, past social history, past surgical history and problem list.  Past Medical History Past Medical History:  Diagnosis Date  . Nexplanon removal 06/09/2014  . Pregnant 11/10/2014    Past Surgical History History reviewed. No pertinent surgical history.  Gynecologic History G1P1001  No LMP recorded. Patient has had an implant. Contraception: Nexplanon, inserted 08/22/15 Last Pap: never. Results were: n/a Last mammogram: never. Results were: n/a Last TCS: never  Obstetric History OB History  Gravida Para Term Preterm AB Living  1 1 1  0 0 1  SAB TAB Ectopic Multiple Live Births  0 0 0 0 1    # Outcome Date GA Lbr Len/2nd Weight Sex Delivery Anes PTL Lv  1 Term 07/17/15 7033w0d / 00:49 6 lb 14.8 oz (3.14 kg) F Vag-Spont EPI  LIV      Current Medications No current outpatient prescriptions on file prior to visit.   No current facility-administered medications on file prior to visit.     Review of Systems Patient denies any headaches, blurred vision, shortness of breath, chest pain, abdominal pain, problems with bowel movements, urination, or intercourse.  Objective:  BP (!) 92/56 (BP Location: Right Arm, Patient Position: Sitting, Cuff Size: Normal)   Pulse 80   Ht 4' 10.5" (1.486 m)   Wt 114 lb (51.7 kg)   BMI 23.42 kg/m  Physical Exam  General:  Well  developed, well nourished, no acute distress. She is alert and oriented x3. Skin:  Warm and dry Neck:  Midline trachea, no thyromegaly or nodules Cardiovascular: Regular rate and rhythm, no murmur heard Lungs:  Effort normal, all lung fields clear to auscultation bilaterally Breasts:  No dominant palpable mass, retraction, or nipple discharge Abdomen:  Soft, non tender, no hepatosplenomegaly or masses Pelvic:  External genitalia is normal in appearance.  The vagina is normal in appearance. The cervix is bulbous, no CMT.  Thin prep pap is done w/ reflex HR HPV cotesting. Uterus is felt to be normal size, shape, and contour.  No adnexal masses or tenderness noted. Extremities:  No swelling or varicosities noted Psych:  She has a normal mood and affect  Assessment:   Healthy well-woman exam Smoker  Plan:  GC/CT from pap Advised smoking cessation F/U 8390yr for physical, or sooner if needed Mammogram @21yo  or sooner if problems Colonoscopy @20yo  or sooner if problems  Marge DuncansBooker, Jacqueline Koch CNM, [facility] 01/16/2016 4:28 PM

## 2016-01-18 LAB — CYTOLOGY - PAP
CHLAMYDIA, DNA PROBE: NEGATIVE
DIAGNOSIS: NEGATIVE
Neisseria Gonorrhea: NEGATIVE

## 2016-07-22 ENCOUNTER — Encounter (HOSPITAL_COMMUNITY): Payer: Self-pay | Admitting: Adult Health

## 2016-07-22 ENCOUNTER — Emergency Department (HOSPITAL_COMMUNITY)
Admission: EM | Admit: 2016-07-22 | Discharge: 2016-07-22 | Disposition: A | Discharge status: Home Health | Payer: Medicaid Other | Attending: Emergency Medicine | Admitting: Emergency Medicine

## 2016-07-22 DIAGNOSIS — F17210 Nicotine dependence, cigarettes, uncomplicated: Secondary | ICD-10-CM | POA: Insufficient documentation

## 2016-07-22 DIAGNOSIS — K0889 Other specified disorders of teeth and supporting structures: Secondary | ICD-10-CM

## 2016-07-22 DIAGNOSIS — K029 Dental caries, unspecified: Secondary | ICD-10-CM | POA: Insufficient documentation

## 2016-07-22 MED ORDER — CLINDAMYCIN HCL 300 MG PO CAPS: 300.0000 mg | ORAL_CAPSULE | Freq: Four times a day (QID) | ORAL | 0 refills | Status: DC

## 2016-07-22 MED ORDER — CLINDAMYCIN HCL 150 MG PO CAPS
300.0000 mg | ORAL_CAPSULE | Freq: Once | ORAL | Status: AC
  Administered 2016-07-22: 300 mg via ORAL
  Filled 2016-07-22: qty 2

## 2016-07-22 MED ORDER — DICLOFENAC SODIUM 75 MG PO TBEC
75.0000 mg | DELAYED_RELEASE_TABLET | Freq: Two times a day (BID) | ORAL | 0 refills | Status: DC
Start: 2016-07-22 — End: 2018-11-18

## 2016-07-22 NOTE — Discharge Instructions (Signed)
Call one of the dentists on the list provided to arrange a follow-up appt.   °

## 2016-07-22 NOTE — ED Triage Notes (Signed)
Redness and swelling to L mouth area into jaw Pt reports bad tooth  No dentist  No PCP

## 2016-07-22 NOTE — ED Provider Notes (Signed)
[facility]Y [facility]PT Provider Note   CSN: [id number] Arrival date & time: 07/22/16  1631     History   Chief Complaint Chief Complaint  Patient presents with  . Dental Pain    HPI Jacqueline Koch is a 21 y.o. female.  HPI   Jacqueline Koch is a 21 y.o. female who presents to the Emergency Department complaining of dental pain for several weeks.  States pain had subsided, but woke up this morning with pain to the upper left molar and noticed mild swelling of the left cheek,.  Pain associated with chewing.  She has not taken any medications prior to arrival.  Denies fever, neck pain, difficulty swallowing.  Has not arranged appt with a dentist.      Past Medical History:  Diagnosis Date  . Nexplanon removal 06/09/2014  . Pregnant 11/10/2014    Patient Active Problem List   Diagnosis Date Noted  . Nexplanon insertion 08/22/2015  . Susceptible to varicella (non-immune), currently pregnant 12/13/2014  . Marijuana use 12/08/2014  . Enterococcus UTI 01/20/2013  . Chlamydia infection 01/20/2013  . Adolescent scoliosis 12/01/2012  . Abnormal vision screen 12/01/2012  . Hearing deficit 12/01/2012  . Scoliosis 11/02/2010  . Back pain 11/02/2010    History reviewed. No pertinent surgical history.  OB History    Gravida Para Term Preterm AB Living   1 1 1  0 0 1   SAB TAB Ectopic Multiple Live Births   0 0 0 0 1       Home Medications    Prior to Admission medications   Not on File    Family History Family History  Problem Relation Age of Onset  . Asthma Brother   . Arthritis Paternal Grandmother   . Crohn's disease Maternal Grandmother     Social History Social History  Substance Use Topics  . Smoking status: Light Tobacco Smoker    Years: 1.00    Types: Cigarettes  . Smokeless tobacco: Never Used     Comment: about 2 cigs per day  . Alcohol use No     Allergies   Patient has no known allergies.   Review of Systems Review of Systems  Constitutional:  Negative for appetite change and fever.  HENT: Positive for dental problem and facial swelling. Negative for congestion, sore throat and trouble swallowing.   Eyes: Negative for pain and visual disturbance.  Musculoskeletal: Negative for neck pain and neck stiffness.  Neurological: Negative for dizziness, facial asymmetry and headaches.  Hematological: Negative for adenopathy.  All other systems reviewed and are negative.    Physical Exam Updated Vital Signs BP 106/68   Pulse 92   Temp 98.2 F (36.8 C) (Oral)   Resp 18   SpO2 100%   Physical Exam  Constitutional: She is oriented to person, place, and time. She appears well-developed and well-nourished. No distress.  HENT:  Head: Normocephalic and atraumatic.  Right Ear: Tympanic membrane and ear canal normal.  Left Ear: Tympanic membrane and ear canal normal.  Mouth/Throat: Uvula is midline, oropharynx is clear and moist and mucous membranes are normal. No trismus in the jaw. Dental caries present. No dental abscesses or uvula swelling.  ttp and dental caries of the left upper second molar, mild edema of the surrounding gingiva.  No fluctuance.   Mild localized swelling of left cheek, no obvious dental abscess, trismus, or sublingual abnml.    Neck: Normal range of motion. Neck supple.  Cardiovascular: Normal rate, regular rhythm and  normal heart sounds.   No murmur heard. Pulmonary/Chest: Effort normal and breath sounds normal.  Musculoskeletal: Normal range of motion.  Lymphadenopathy:    She has no cervical adenopathy.  Neurological: She is alert and oriented to person, place, and time. She exhibits normal muscle tone. Coordination normal.  Skin: Skin is warm and dry. No rash noted.  Psychiatric: She has a normal mood and affect.  Nursing note and vitals reviewed.    ED Treatments / Results  Labs (all labs ordered are listed, but only abnormal results are displayed) Labs Reviewed - No data to display  EKG  EKG  Interpretation None       Radiology No results found.  Procedures Procedures (including critical care time)  Medications Ordered in ED Medications - No data to display   Initial Impression / Assessment and Plan / ED Course  I have reviewed the triage vital signs and the nursing notes.  Pertinent labs & imaging results that were available during my care of the patient were reviewed by me and considered in my medical decision making (see chart for details).     Pt is well appearing.  Vitals sable.  No concerning sx's for Ludwig's angina or drainable dental abscess. Airway patent.  Referral info given for local dentist.    Final Clinical Impressions(s) / ED Diagnoses   Final diagnoses:  Pain, dental    New Prescriptions New Prescriptions   No medications on file     Rosey Bathriplett, Raianna Slight, PA-C 07/22/16 1730    Raeford RazorKohut, Stephen, MD 07/26/16 1054

## 2016-10-15 ENCOUNTER — Emergency Department (HOSPITAL_COMMUNITY)
Admission: EM | Admit: 2016-10-15 | Discharge: 2016-10-15 | Disposition: A | Discharge status: Home / Self Care | Payer: Self-pay | Attending: Emergency Medicine | Admitting: Emergency Medicine

## 2016-10-15 ENCOUNTER — Encounter (HOSPITAL_COMMUNITY): Payer: Self-pay | Admitting: Emergency Medicine

## 2016-10-15 DIAGNOSIS — F17210 Nicotine dependence, cigarettes, uncomplicated: Secondary | ICD-10-CM | POA: Insufficient documentation

## 2016-10-15 DIAGNOSIS — J029 Acute pharyngitis, unspecified: Secondary | ICD-10-CM | POA: Insufficient documentation

## 2016-10-15 DIAGNOSIS — K0889 Other specified disorders of teeth and supporting structures: Secondary | ICD-10-CM | POA: Insufficient documentation

## 2016-10-15 LAB — RAPID STREP SCREEN (MED CTR MEBANE ONLY): Streptococcus, Group A Screen (Direct): NEGATIVE

## 2016-10-15 MED ORDER — PENICILLIN V POTASSIUM 500 MG PO TABS: 500.0000 mg | ORAL_TABLET | Freq: Four times a day (QID) | ORAL | 0 refills | Status: AC

## 2016-10-15 NOTE — ED Provider Notes (Signed)
[facility]-EMERGENCY DEPT Provider Note   CSN: [id number] Arrival date & time: 10/15/16  [id number]     History   Chief Complaint Chief Complaint  Patient presents with  . Dental Pain  . Sore Throat    HPI Jacqueline Koch is a 21 y.o. female.  HPI  Patient presents to ED for 1 day history of left-sided dental pain radiating to the ear and throat. Reports history of similar symptoms in the past. Has not tried any medications to help with pain. States that pain started spontaneously and has been constant. Denies any chipped teeth, bleeding or drainage from site. She does not have a dentist that she is able to see. She denies any trouble breathing, trouble swallowing, neck pain, fever, nausea, vomiting.  Past Medical History:  Diagnosis Date  . Nexplanon removal 06/09/2014    Patient Active Problem List   Diagnosis Date Noted  . Nexplanon insertion 08/22/2015  . Susceptible to varicella (non-immune), currently pregnant 12/13/2014  . Marijuana use 12/08/2014  . Enterococcus UTI 01/20/2013  . Chlamydia infection 01/20/2013  . Adolescent scoliosis 12/01/2012  . Abnormal vision screen 12/01/2012  . Hearing deficit 12/01/2012  . Scoliosis 11/02/2010  . Back pain 11/02/2010    History reviewed. No pertinent surgical history.  OB History    Gravida Para Term Preterm AB Living   1 1 1  0 0 1   SAB TAB Ectopic Multiple Live Births   0 0 0 0 1       Home Medications    Prior to Admission medications   Medication Sig Start Date End Date Taking? Authorizing Provider  acetaminophen (TYLENOL) 500 MG tablet Take 1,000 mg by mouth every 6 (six) hours as needed.   Yes [provider]  benzocaine (ORAJEL) 10 % mucosal gel Use as directed 1 application in the mouth or throat as needed for mouth pain.   Yes [provider]  clindamycin (CLEOCIN) 300 MG capsule Take 1 capsule (300 mg total) by mouth 4 (four) times daily. For 7 days Patient not taking: Reported on 10/15/2016 07/22/16    Pauline Ausriplett, Tammy, PA-C  diclofenac (VOLTAREN) 75 MG EC tablet Take 1 tablet (75 mg total) by mouth 2 (two) times daily. Take with food Patient not taking: Reported on 10/15/2016 07/22/16   Triplett, Tammy, PA-C  penicillin v potassium (VEETID) 500 MG tablet Take 1 tablet (500 mg total) by mouth 4 (four) times daily. 10/15/16 10/22/16  Dietrich PatesKhatri, Dinesh [place]lys[place], PA-C    Family History Family History  Problem Relation Age of Onset  . Asthma Brother   . Arthritis Paternal Grandmother   . Crohn's disease Maternal Grandmother     Social History Social History  Substance Use Topics  . Smoking status: Light Tobacco Smoker    Years: 1.00    Types: Cigarettes  . Smokeless tobacco: Never Used     Comment: about 2 cigs per day  . Alcohol use No     Allergies   Patient has no known allergies.   Review of Systems Review of Systems  Constitutional: Negative for chills and fever.  HENT: Positive for dental problem, ear pain and sore throat. Negative for congestion, drooling, ear discharge, hearing loss, mouth sores, rhinorrhea, sinus pain, sinus pressure, trouble swallowing and voice change.   Respiratory: Negative for cough.   Gastrointestinal: Negative for nausea and vomiting.  Skin: Negative for rash.     Physical Exam Updated Vital Signs BP 101/76   Pulse 72   Temp 98.7 F (  37.1 C) (Oral)   Resp 16   Ht  (1.473 m)   Wt 54.4 kg (120 lb)   LMP  (LMP Unknown)   SpO2 100%   BMI 25.08 kg/m   Physical Exam  Constitutional: She appears well-developed and well-nourished. No distress.  HENT:  Head: Normocephalic and atraumatic.  Mouth/Throat:    Patient does not appear to be in acute distress. No trismus or drooling present. No pooling of secretions. Patient is tolerating secretions and is not in respiratory distress. No neck pain or tenderness to palpation of the neck. Full active and passive range of motion of the neck. No evidence of RPA or PTA.  Eyes: Conjunctivae and EOM are  normal. No scleral icterus.  Neck: Normal range of motion.  Pulmonary/Chest: Effort normal. No respiratory distress.  Neurological: She is alert.  Skin: No rash noted. She is not diaphoretic.  Psychiatric: She has a normal mood and affect.  Nursing note and vitals reviewed.    ED Treatments / Results  Labs (all labs ordered are listed, but only abnormal results are displayed) Labs Reviewed  RAPID STREP SCREEN (NOT AT [facility])  CULTURE, GROUP A STREP [facility])    EKG  EKG Interpretation None       Radiology No results found.  Procedures Procedures (including critical care time)  Medications Ordered in ED Medications - No data to display   Initial Impression / Assessment and Plan / ED Course  I have reviewed the triage vital signs and the nursing notes.  Pertinent labs & imaging results that were available during my care of the patient were reviewed by me and considered in my medical decision making (see chart for details).     Patient presents to ED for evaluation of left-sided dental pain radiating to ear and throat. My physical exam there is no chipped teeth, bleeding or discharge noted. No drooling, trismus, signs of RPA or PTA noted. Strep test returned as negative. No evidence of Ludwig angina or other acute severe deep tissue infection of the neck. She is afebrile with no history of fever. She is nontoxic appearing and tolerating secretions. We will discharge with penicillin and referred to dentist on call for further evaluation. Patient appears stable for discharge at this time. Strict return precautions given.    Final Clinical Impressions(s) / ED Diagnoses   Final diagnoses:  Pain, dental    New Prescriptions Discharge Medication List as of 10/15/2016 12:02 PM    START taking these medications   Details  penicillin v potassium (VEETID) 500 MG tablet Take 1 tablet (500 mg total) by mouth 4 (four) times daily., Starting Mon 10/15/2016, Until Mon 10/22/2016, Print           Idelle Leech, [place], PA-C 10/15/16 1319    Samuel Jester, DO 10/17/16 [phone]

## 2016-10-15 NOTE — Discharge Instructions (Signed)
Please read attached information regarding your condition. Take penicillin 4 times daily for 7 days. Follow up with dentist was below for further evaluation. Return to ED for worsening pain, fevers, increased sore throat, trouble breathing, trouble swallowing, trouble opening mouth.

## 2016-10-15 NOTE — ED Triage Notes (Signed)
Pt c/o left side dental pain that is making her ear hurt and c/o sore throat. Swelling to throat noted. Nad.

## 2016-10-18 LAB — CULTURE, GROUP A STREP (THRC)

## 2018-11-16 ENCOUNTER — Other Ambulatory Visit: Payer: Self-pay

## 2018-11-16 ENCOUNTER — Emergency Department (HOSPITAL_COMMUNITY)
Admission: EM | Admit: 2018-11-16 | Discharge: 2018-11-16 | Disposition: A | Discharge status: Home / Self Care | Payer: Medicaid Other | Attending: Emergency Medicine | Admitting: Emergency Medicine

## 2018-11-16 ENCOUNTER — Encounter (HOSPITAL_COMMUNITY): Payer: Self-pay | Admitting: Emergency Medicine

## 2018-11-16 DIAGNOSIS — F17210 Nicotine dependence, cigarettes, uncomplicated: Secondary | ICD-10-CM | POA: Diagnosis not present

## 2018-11-16 DIAGNOSIS — R55 Syncope and collapse: Secondary | ICD-10-CM

## 2018-11-16 LAB — I-STAT CHEM 8, ED
BUN: 8 mg/dL (ref 6–20)
Calcium, Ion: 1.19 mmol/L (ref 1.15–1.40)
Chloride: 107 mmol/L (ref 98–111)
Creatinine, Ser: 0.5 mg/dL (ref 0.44–1.00)
Glucose, Bld: 80 mg/dL (ref 70–99)
HCT: 37 % (ref 36.0–46.0)
Hemoglobin: 12.6 g/dL (ref 12.0–15.0)
Potassium: 3.7 mmol/L (ref 3.5–5.1)
Sodium: 142 mmol/L (ref 135–145)
TCO2: 22 mmol/L (ref 22–32)

## 2018-11-16 LAB — URINALYSIS, ROUTINE W REFLEX MICROSCOPIC
Bilirubin Urine: NEGATIVE
Glucose, UA: NEGATIVE mg/dL
Hgb urine dipstick: NEGATIVE
Ketones, ur: NEGATIVE mg/dL
Leukocytes,Ua: NEGATIVE
Nitrite: NEGATIVE
Protein, ur: 30 mg/dL — AB
Specific Gravity, Urine: 1.027 (ref 1.005–1.030)
pH: 5 (ref 5.0–8.0)

## 2018-11-16 LAB — I-STAT BETA HCG BLOOD, ED (MC, WL, AP ONLY): I-stat hCG, quantitative: <5 m[IU]/mL (ref <5)

## 2018-11-16 NOTE — ED Triage Notes (Signed)
Pt states she had a syncopal episode while in the bathroom. Has has this before. Feels fine now.

## 2018-11-16 NOTE — ED Provider Notes (Signed)
[facility] EMERGENCY DEPARTMENT Provider Note   CSN: [id number] Arrival date & time: 11/16/18  1443     History   Chief Complaint Chief Complaint  Patient presents with  . Loss of Consciousness    HPI Jacqueline Koch is a 23 y.o. female.     HPI Patient presents after episode of syncope that occurred yesterday.  She has a history of syncope when she was younger, but has had no events in about 3 years. Yesterday, while urinating she had loss of consciousness per No subsequent trauma, she does not recall pain either before or afterwards, did not have confusion afterwards Today, given concern of yesterday's event she presents for evaluation. She states that she is generally well, has a 38-year-old, stays at home with her child, does not work. She has not ever been seen for syncope.   Past Medical History:  Diagnosis Date  . Nexplanon removal 06/09/2014    Patient Active Problem List   Diagnosis Date Noted  . Nexplanon insertion 08/22/2015  . Susceptible to varicella (non-immune), currently pregnant 12/13/2014  . Marijuana use 12/08/2014  . Enterococcus UTI 01/20/2013  . Chlamydia infection 01/20/2013  . Adolescent scoliosis 12/01/2012  . Abnormal vision screen 12/01/2012  . Hearing deficit 12/01/2012  . Scoliosis 11/02/2010  . Back pain 11/02/2010    History reviewed. No pertinent surgical history.   OB History    Gravida  1   Para  1   Term  1   Preterm  0   AB  0   Living  1     SAB  0   TAB  0   Ectopic  0   Multiple  0   Live Births  1            Home Medications    Prior to Admission medications   Medication Sig Start Date End Date Taking? Authorizing Provider  acetaminophen (TYLENOL) 500 MG tablet Take 1,000 mg by mouth every 6 (six) hours as needed.    [provider]  benzocaine (ORAJEL) 10 % mucosal gel Use as directed 1 application in the mouth or throat as needed for mouth pain.    [provider]   clindamycin (CLEOCIN) 300 MG capsule Take 1 capsule (300 mg total) by mouth 4 (four) times daily. For 7 days Patient not taking: Reported on 10/15/2016 07/22/16   Kem Parkinson, PA-C  diclofenac (VOLTAREN) 75 MG EC tablet Take 1 tablet (75 mg total) by mouth 2 (two) times daily. Take with food Patient not taking: Reported on 10/15/2016 07/22/16   Kem Parkinson, PA-C    Family History Family History  Problem Relation Age of Onset  . Asthma Brother   . Arthritis Paternal Grandmother   . Crohn's disease Maternal Grandmother     Social History Social History   Tobacco Use  . Smoking status: Light Tobacco Smoker    Years: 1.00    Types: Cigarettes  . Smokeless tobacco: Never Used  . Tobacco comment: about 2 cigs per day  Substance Use Topics  . Alcohol use: No    Alcohol/week: 0.0 standard drinks  . Drug use: No     Allergies   Patient has no known allergies.   Review of Systems Review of Systems  Constitutional:       Per HPI, otherwise negative  HENT:       Per HPI, otherwise negative  Respiratory:       Per HPI, otherwise negative  Cardiovascular:  Per HPI, otherwise negative  Gastrointestinal: Negative for vomiting.  Endocrine:       Negative aside from HPI  Genitourinary:       Neg aside from HPI   Musculoskeletal:       Per HPI, otherwise negative  Skin: Negative.   Neurological: Positive for syncope.     Physical Exam Updated Vital Signs BP 97/83   Pulse 100   Temp 98 F (36.7 C) (Oral)   Resp 16   Ht 4\' 11"  (1.499 m)   Wt 54 kg   SpO2 99%   BMI 24.04 kg/m   Physical Exam Vitals signs and nursing note reviewed.  Constitutional:      General: She is not in acute distress.    Appearance: She is well-developed.  HENT:     Head: Normocephalic and atraumatic.  Eyes:     Conjunctiva/sclera: Conjunctivae normal.  Cardiovascular:     Rate and Rhythm: Normal rate and regular rhythm.     Comments: Borderline tachycardia Pulmonary:      Effort: Pulmonary effort is normal. No respiratory distress.     Breath sounds: Normal breath sounds. No stridor.  Abdominal:     General: There is no distension.  Skin:    General: Skin is warm and dry.  Neurological:     Mental Status: She is alert and oriented to person, place, and time.     Cranial Nerves: No cranial nerve deficit.      ED Treatments / Results  Labs (all labs ordered are listed, but only abnormal results are displayed) Labs Reviewed  URINALYSIS, ROUTINE W REFLEX MICROSCOPIC - Abnormal; Notable for the following components:      Result Value   APPearance HAZY (*)    Protein, ur 30 (*)    Bacteria, UA FEW (*)    All other components within normal limits  I-STAT BETA HCG BLOOD, ED ([facility], [facility], [facility] ONLY)  I-STAT CHEM 8, ED    EKG Sinus rhythm, 73, with sinus arrhythmia.  Q waves anteriorly, abnormal EKG  Procedures Procedures (including critical care time)  Medications Ordered in ED Medications - No data to display   Initial Impression / Assessment and Plan / ED Course  I have reviewed the triage vital signs and the nursing notes.  Pertinent labs & imaging results that were available during my care of the patient were reviewed by me and considered in my medical decision making (see chart for details).        5:54 PM Patient awake, alert, sitting upright, using a mobile telephone. She states that she is hungry, has no new complaints, has had no additional episodes of syncope. We discussed all findings at length, need for continued evaluation as an outpatient given the episode of syncope EKG reassuring, with only sinus arrhythmia as abnormality. With no evidence for other acute new pathology, the patient will follow-up closely as an outpatient for additional studies.  Final Clinical Impressions(s) / ED Diagnoses   Final diagnoses:  Syncope and collapse     , MD 11/16/18 1755

## 2018-11-16 NOTE — Discharge Instructions (Addendum)
As discussed, today's evaluation has been largely reassuring. However, with concern for your episode of losing consciousness, or syncope, it is important that he follow-up either with your primary care physician or cardiac specialists. Please call [facility] for an appointment this week. Please discuss yesterday's event, and the need for consideration of Holter monitoring to check for any ongoing rhythm disturbances in your heart.  Return here for concerning changes in your condition.

## 2018-11-18 ENCOUNTER — Ambulatory Visit (INDEPENDENT_AMBULATORY_CARE_PROVIDER_SITE_OTHER): Payer: Self-pay | Admitting: Cardiology

## 2018-11-18 ENCOUNTER — Ambulatory Visit (INDEPENDENT_AMBULATORY_CARE_PROVIDER_SITE_OTHER): Payer: Medicaid Other

## 2018-11-18 ENCOUNTER — Encounter: Payer: Self-pay | Admitting: Cardiology

## 2018-11-18 ENCOUNTER — Other Ambulatory Visit: Payer: Self-pay

## 2018-11-18 VITALS — BP 94/61 | HR 76 | Ht 59.0 in | Wt 107.0 lb

## 2018-11-18 DIAGNOSIS — R002 Palpitations: Secondary | ICD-10-CM

## 2018-11-18 DIAGNOSIS — R55 Syncope and collapse: Secondary | ICD-10-CM

## 2018-11-18 NOTE — Patient Instructions (Signed)
Medication Instructions:  .isntcur  If you need a refill on your cardiac medications before your next appointment, please call your pharmacy.   Lab work: NONE If you have labs (blood work) drawn today and your tests are completely normal, you will receive your results only by: Marland Kitchen MyChart Message (if you have MyChart) OR . A paper copy in the mail If you have any lab test that is abnormal or we need to change your treatment, we will call you to review the results.  Testing/Procedures: Your physician has requested that you have an echocardiogram. Echocardiography is a painless test that uses sound waves to create images of your heart. It provides your doctor with information about the size and shape of your heart and how well your heart's chambers and valves are working. This procedure takes approximately one hour. There are no restrictions for this procedure. You may get an IV, if needed, to receive an ultrasound enhancing agent through to better visualize your heart.    Your physician has recommended that you wear an 14 DAY ZIO event monitor. Event monitors are medical devices that record the heart's electrical activity. Doctors most often [place] these monitors to diagnose arrhythmias. Arrhythmias are problems with the speed or rhythm of the heartbeat. The monitor is a small, portable device. You can wear one while you do your normal daily activities. This is usually used to diagnose what is causing palpitations/syncope (passing out). A Zio Patch Event Heart monitor will be applied to your chest today.  You will wear the patch for 14 days. After 24 hours, you may shower with the heart monitor on. If you feel any SYMPTOMS OR PALPITATIONS, you may press and release the button in the middle of the monitor.   Follow-Up: At [facility], you and your health needs are our priority.  As part of our continuing mission to provide you with exceptional heart care, we have created designated Provider Care  Teams.  These Care Teams include your primary Cardiologist (physician) and Advanced Practice Providers (APPs -  Physician Assistants and Nurse Practitioners) who all work together to provide you with the care you need, when you need it. You will need a follow up appointment in 4 weeks.  Please call our office 2 months in advance to schedule this appointment.  You may see Debbe Odea, MD or one of the following Advanced Practice Providers on your designated Care Team:   Nicolasa Ducking, NP Eula Listen, PA-C . Marisue Ivan, PA-C    Echocardiogram An echocardiogram is a procedure that uses painless sound waves (ultrasound) to produce an image of the heart. Images from an echocardiogram can provide important information about:  Signs of coronary artery disease (CAD).  Aneurysm detection. An aneurysm is a weak or damaged part of an artery wall that bulges out from the normal force of blood pumping through the body.  Heart size and shape. Changes in the size or shape of the heart can be associated with certain conditions, including heart failure, aneurysm, and CAD.  Heart muscle function.  Heart valve function.  Signs of a past heart attack.  Fluid buildup around the heart.  Thickening of the heart muscle.  A tumor or infectious growth around the heart valves. Tell a health care provider about:  Any allergies you have.  All medicines you are taking, including vitamins, herbs, eye drops, creams, and over-the-counter medicines.  Any blood disorders you have.  Any surgeries you have had.  Any medical conditions you have.  Whether you are pregnant or may be pregnant. What are the risks? Generally, this is a safe procedure. However, problems may occur, including:  Allergic reaction to dye (contrast) that may be used during the procedure. What happens before the procedure? No specific preparation is needed. You may eat and drink normally. What happens during the  procedure?   An IV tube may be inserted into one of your veins.  You may receive contrast through this tube. A contrast is an injection that improves the quality of the pictures from your heart.  A gel will be applied to your chest.  A wand-like tool (transducer) will be moved over your chest. The gel will help to transmit the sound waves from the transducer.  The sound waves will harmlessly bounce off of your heart to allow the heart images to be captured in real-time motion. The images will be recorded on a computer. The procedure may vary among health care providers and hospitals. What happens after the procedure?  You may return to your normal, everyday life, including diet, activities, and medicines, unless your health care provider tells you not to do that. Summary  An echocardiogram is a procedure that uses painless sound waves (ultrasound) to produce an image of the heart.  Images from an echocardiogram can provide important information about the size and shape of your heart, heart muscle function, heart valve function, and fluid buildup around your heart.  You do not need to do anything to prepare before this procedure. You may eat and drink normally.  After the echocardiogram is completed, you may return to your normal, everyday life, unless your health care provider tells you not to do that. This information is not intended to replace advice given to you by your health care provider. Make sure you discuss any questions you have with your health care provider. Document Released: 01/20/2000 Document Revised: 05/15/2018 Document Reviewed: 02/25/2016 Elsevier Patient Education  2020 [employer].

## 2018-11-18 NOTE — Progress Notes (Signed)
Cardiology Office Note:    Date:  11/18/2018   ID:  Jacqueline Koch, DOB 10-Nov-1995, MRN [id number]  PCP:  Patient, No Pcp Per  Cardiologist:  Kate Sable, MD  Electrophysiologist:  None   Referring MD: Carmin Muskrat, MD   Chief Complaint  Patient presents with   other    Ortho BP c/o sob. Meds reviewed verbally with pt.    History of Present Illness:    Jacqueline Koch is a 23 y.o. female with no significant past medical history who presents due to syncope about 2 days ago.  Patient was at home, was urinating and thought she may have a bowel movement and then does not remember what happened.  She later found herself on the floor, with her child calling her name and also experienced some palpitations.  She felt really tired upon waking up and laid down and slept till the next morning.  She states having prior episode of syncope about 3-1/2 years ago.  She was about to drive on a very warm day, suddenly felt dizzy, sweaty and lightheaded.  She parked her car walked out onto the driveway and try to walk back into her home.  She passed out and was brought into her home by her significant other.  She denies any history of cardiac disease but states having occasional palpitations about 1-2 times a month lasting for couple of seconds.  She denies any immediate family history of heart disease.  Past Medical History:  Diagnosis Date   Nexplanon removal 06/09/2014    History reviewed. No pertinent surgical history.  Current Medications: No outpatient medications have been marked as taking for the 11/18/18 encounter (Office Visit) with Kate Sable, MD.     Allergies:   Patient has no known allergies.   Social History   Socioeconomic History   Marital status: Single    Spouse name: Not on file   Number of children: Not on file   Years of education: in 10th   Highest education level: Not on file  Occupational History   Occupation: Scientist, forensic strain: Not on file   Food insecurity    Worry: Not on file    Inability: Not on file   Transportation needs    Medical: Not on file    Non-medical: Not on file  Tobacco Use   Smoking status: Light Tobacco Smoker    Years: 5.00    Types: Cigarettes   Smokeless tobacco: Never Used   Tobacco comment: about 2 cigs per day  Substance and Sexual Activity   Alcohol use: Yes    Alcohol/week: 0.0 standard drinks    Comment: social   Drug use: No   Sexual activity: Yes    Birth control/protection: Implant  Lifestyle   Physical activity    Days per week: Not on file    Minutes per session: Not on file   Stress: Not on file  Relationships   Social connections    Talks on phone: Not on file    Gets together: Not on file    Attends religious service: Not on file    Active member of club or organization: Not on file    Attends meetings of clubs or organizations: Not on file    Relationship status: Not on file  Other Topics Concern   Not on file  Social History Narrative   Not on file     Family History: The patient's family history  includes Arthritis in her paternal grandmother; Asthma in her brother; Crohn's disease in her maternal grandmother; Heart Problems in her paternal grandmother.  ROS:   Please see the history of present illness.     All other systems reviewed and are negative.  EKGs/Labs/Other Studies Reviewed:    The following studies were reviewed today:   EKG:  EKG is  ordered today.  The ekg ordered today demonstrates normal sinus rhythm, sinus arrhythmia, normal ECG.  Recent Labs: 11/16/2018: BUN 8; Creatinine, Ser 0.50; Hemoglobin 12.6; Potassium 3.7; Sodium 142  Recent Lipid Panel No results found for: CHOL, TRIG, HDL, CHOLHDL, VLDL, LDLCALC, LDLDIRECT  Physical Exam:    VS:  BP 94/61 (BP Location: Right Arm, Patient Position: Sitting, Cuff Size: Normal)    Pulse 76    Ht 4\' 11"  (1.499 m)    Wt 107 lb (48.5 kg)    SpO2 98%    BMI  21.61 kg/m     Wt Readings from Last 3 Encounters:  11/18/18 107 lb (48.5 kg)  11/16/18 119 lb 0.8 oz (54 kg)  10/15/16 120 lb (54.4 kg)     GEN:  Well nourished, well developed in no acute distress HEENT: Normal NECK: No JVD; No carotid bruits LYMPHATICS: No lymphadenopathy CARDIAC: RRR, no murmurs, rubs, gallops RESPIRATORY:  Clear to auscultation without rales, wheezing or rhonchi  ABDOMEN: Soft, non-tender, non-distended MUSCULOSKELETAL:  No edema; No deformity  SKIN: Warm and dry NEUROLOGIC:  Alert and oriented x 3 PSYCHIATRIC:  Normal affect   ASSESSMENT:   Orthostatic vitals in the office today did not show any evidence of orthostasis.  Patient symptoms are most consistent with reflex mediated syncope.  3 years ago it appears to be vasovagal on a( warm day), and 2 days ago situational (micturition/defecation).  However due to symptoms of palpitations, a cardiac etiology is possible although less likely.  1. Palpitations   2. Syncope, unspecified syncope type    PLAN:    In order of problems listed above:  1. ZIO Patch x2 weeks, echo. 2. Likely vasovagal.  Patient educated on vasovagal syncope and prodromes.  She was advised on how to avoid hazards when prodromes occur.  Follow-up after ZIO Patch and echo.  Total encounter time more than 45 minutes  Greater than 50% was spent in counseling and coordination of care with the patient   Medication Adjustments/Labs and Tests Ordered: Current medicines are reviewed at length with the patient today.  Concerns regarding medicines are outlined above.  Orders Placed This Encounter  Procedures   LONG TERM MONITOR (3-14 DAYS)   EKG 12-Lead   ECHOCARDIOGRAM COMPLETE   No orders of the defined types were placed in this encounter.   Patient Instructions  Medication Instructions:  .isntcur  If you need a refill on your cardiac medications before your next appointment, please call your pharmacy.   Lab  work: NONE If you have labs (blood work) drawn today and your tests are completely normal, you will receive your results only by:  MyChart Message (if you have MyChart) OR  A paper copy in the mail If you have any lab test that is abnormal or we need to change your treatment, we will call you to review the results.  Testing/Procedures: Your physician has requested that you have an echocardiogram. Echocardiography is a painless test that uses sound waves to create images of your heart. It provides your doctor with information about the size and shape of your heart and how well  your hearts chambers and valves are working. This procedure takes approximately one hour. There are no restrictions for this procedure. You may get an IV, if needed, to receive an ultrasound enhancing agent through to better visualize your heart.    Your physician has recommended that you wear an 14 DAY ZIO event monitor. Event monitors are medical devices that record the hearts electrical activity. Doctors most often [place] these monitors to diagnose arrhythmias. Arrhythmias are problems with the speed or rhythm of the heartbeat. The monitor is a small, portable device. You can wear one while you do your normal daily activities. This is usually used to diagnose what is causing palpitations/syncope (passing out). A Zio Patch Event Heart monitor will be applied to your chest today.  You will wear the patch for 14 days. After 24 hours, you may shower with the heart monitor on. If you feel any SYMPTOMS OR PALPITATIONS, you may press and release the button in the middle of the monitor.   Follow-Up: At [facility], you and your health needs are our priority.  As part of our continuing mission to provide you with exceptional heart care, we have created designated Provider Care Teams.  These Care Teams include your primary Cardiologist (physician) and Advanced Practice Providers (APPs -  Physician Assistants and Nurse  Practitioners) who all work together to provide you with the care you need, when you need it. You will need a follow up appointment in 4 weeks.  Please call our office 2 months in advance to schedule this appointment.  You may see Debbe Odea, MD or one of the following Advanced Practice Providers on your designated Care Team:   Nicolasa Ducking, NP Eula Listen, PA-C  Marisue Ivan, PA-C    Echocardiogram An echocardiogram is a procedure that uses painless sound waves (ultrasound) to produce an image of the heart. Images from an echocardiogram can provide important information about:  Signs of coronary artery disease (CAD).  Aneurysm detection. An aneurysm is a weak or damaged part of an artery wall that bulges out from the normal force of blood pumping through the body.  Heart size and shape. Changes in the size or shape of the heart can be associated with certain conditions, including heart failure, aneurysm, and CAD.  Heart muscle function.  Heart valve function.  Signs of a past heart attack.  Fluid buildup around the heart.  Thickening of the heart muscle.  A tumor or infectious growth around the heart valves. Tell a health care provider about:  Any allergies you have.  All medicines you are taking, including vitamins, herbs, eye drops, creams, and over-the-counter medicines.  Any blood disorders you have.  Any surgeries you have had.  Any medical conditions you have.  Whether you are pregnant or may be pregnant. What are the risks? Generally, this is a safe procedure. However, problems may occur, including:  Allergic reaction to dye (contrast) that may be used during the procedure. What happens before the procedure? No specific preparation is needed. You may eat and drink normally. What happens during the procedure?   An IV tube may be inserted into one of your veins.  You may receive contrast through this tube. A contrast is an injection that  improves the quality of the pictures from your heart.  A gel will be applied to your chest.  A wand-like tool (transducer) will be moved over your chest. The gel will help to transmit the sound waves from the transducer.  The sound  waves will harmlessly bounce off of your heart to allow the heart images to be captured in real-time motion. The images will be recorded on a computer. The procedure may vary among health care providers and hospitals. What happens after the procedure?  You may return to your normal, everyday life, including diet, activities, and medicines, unless your health care provider tells you not to do that. Summary  An echocardiogram is a procedure that uses painless sound waves (ultrasound) to produce an image of the heart.  Images from an echocardiogram can provide important information about the size and shape of your heart, heart muscle function, heart valve function, and fluid buildup around your heart.  You do not need to do anything to prepare before this procedure. You may eat and drink normally.  After the echocardiogram is completed, you may return to your normal, everyday life, unless your health care provider tells you not to do that. This information is not intended to replace advice given to you by your health care provider. Make sure you discuss any questions you have with your health care provider. Document Released: 01/20/2000 Document Revised: 05/15/2018 Document Reviewed: 02/25/2016 Elsevier Patient Education  2020 [employer].       Signed, Debbe Odea, MD  11/18/2018 3:00 PM    [facility]

## 2018-12-02 ENCOUNTER — Encounter: Payer: Self-pay | Admitting: Women's Health

## 2018-12-02 ENCOUNTER — Ambulatory Visit (INDEPENDENT_AMBULATORY_CARE_PROVIDER_SITE_OTHER): Payer: Medicaid Other | Admitting: Women's Health

## 2018-12-02 ENCOUNTER — Other Ambulatory Visit: Payer: Self-pay

## 2018-12-02 ENCOUNTER — Other Ambulatory Visit (HOSPITAL_COMMUNITY)
Admission: RE | Admit: 2018-12-02 | Discharge: 2018-12-02 | Disposition: A | Discharge status: Home / Self Care | Payer: Medicaid Other | Source: Ambulatory Visit | Attending: Obstetrics & Gynecology | Admitting: Obstetrics & Gynecology

## 2018-12-02 VITALS — BP 102/61 | HR 88 | Ht <= 58 in | Wt 106.5 lb

## 2018-12-02 DIAGNOSIS — Z3202 Encounter for pregnancy test, result negative: Secondary | ICD-10-CM

## 2018-12-02 DIAGNOSIS — Z3046 Encounter for surveillance of implantable subdermal contraceptive: Secondary | ICD-10-CM

## 2018-12-02 DIAGNOSIS — Z30017 Encounter for initial prescription of implantable subdermal contraceptive: Secondary | ICD-10-CM

## 2018-12-02 DIAGNOSIS — Z113 Encounter for screening for infections with a predominantly sexual mode of transmission: Secondary | ICD-10-CM | POA: Diagnosis not present

## 2018-12-02 LAB — POCT URINE PREGNANCY: Preg Test, Ur: NEGATIVE

## 2018-12-02 MED ORDER — ETONOGESTREL 68 MG ~~LOC~~ IMPL
68.0000 mg | DRUG_IMPLANT | Freq: Once | SUBCUTANEOUS | Status: AC
  Administered 2018-12-02: 68 mg via SUBCUTANEOUS

## 2018-12-02 NOTE — Addendum Note (Signed)
Addended by: Linton Rump on: 12/02/2018 02:50 PM   Modules accepted: Orders

## 2018-12-02 NOTE — Progress Notes (Signed)
   [place] AND RE-INSERTION Patient name: Jacqueline Koch MRN [id number]  Date of birth: [birth year] Subjective Findings:   Jacqueline Koch is a 23 y.o. G72P1001 African American female being seen today for Nexplanon removal and re-insertion. Her Nexplanon was placed 08/22/15. She also wants full STD screen  No LMP recorded. Patient has had an implant. Last pap12/11/17. Results were:  normal  Risks/benefits/side effects of Nexplanon have been discussed and her questions have been answered.  Specifically, a failure rate of 02/998 has been reported, with an increased failure rate if pt takes [place] and/or antiseizure medicaitons.  She is aware of the common side effect of irregular bleeding, which the incidence of decreases over time. Signed copy of informed consent in chart.  Pertinent History Reviewed:   Reviewed past medical,surgical, social, obstetrical and family history.  Reviewed problem list, medications and allergies. Objective Findings & Procedure:    Vitals:   12/02/18 1353  BP: 102/61  Pulse: 88  Weight: 106 lb 8 oz (48.3 kg)  Height: 4\' 10"  (1.473 m)  Body mass index is 22.26 kg/m.  Results for orders placed or performed in visit on 12/02/18 (from the past 24 hour(s))  POCT urine pregnancy   Collection Time: 12/02/18  1:56 PM  Result Value Ref Range   Preg Test, Ur Negative Negative     Time out was performed.  Nexplanon site identified.  Area prepped in usual sterile fashon. Two cc's of 2% lidocaine was used to anesthetize the area. A small stab incision was made right beside the implant on the distal portion.  The Nexplanon rod was grasped using hemostats and removed intact without difficulty.  The area was cleansed again with betadine and the Nexplanon was inserted approximately 10cm from the medial epicondyle and 3-5cm posterior to the sulcus per manufacturer's recommendations without difficulty.  Steri-strips and a pressure bandage was applied.  There was  less than 3 cc blood loss. There were no complications.  The patient tolerated the procedure well.  Vaginal self-swab collected Assessment & Plan:   1) Nexplanon removal & re-insertion She was instructed to keep the area clean and dry, remove pressure bandage in 24 hours, and keep insertion site covered with the steri-strips for 3-5 days.  She was given a card indicating date Nexplanon was inserted and date it needs to be removed.  Follow-up PRN problems.  2) STD screen  Orders Placed This Encounter  Procedures  . HIV Antibody (routine testing w rflx)  . RPR  . POCT urine pregnancy    Follow-up: Return in about 2 months (around 02/01/2019) for Pap & physical.  Roma Schanz CNM, WHNP-BC 12/02/2018 2:21 PM

## 2018-12-02 NOTE — Patient Instructions (Signed)
Keep the area clean and dry.  You can remove the big bandage in 24 hours, and the small steri-strip bandage in 3-5 days.  A back up method, such as condoms, should be used for two weeks. You may have irregular vaginal bleeding for the first 6 months after the Nexplanon is placed, then the bleeding usually lightens and it is possible that you may not have any periods.  If you have any concerns, please give us a call.    Etonogestrel implant What is this medicine? ETONOGESTREL (et oh noe JES trel) is a contraceptive (birth control) device. It is used to prevent pregnancy. It can be used for up to 3 years. This medicine may be used for other purposes; ask your health care provider or pharmacist if you have questions. COMMON BRAND NAME(S): Implanon, Nexplanon What should I tell my health care provider before I take this medicine? They need to know if you have any of these conditions:  abnormal vaginal bleeding  blood vessel disease or blood clots  breast, cervical, endometrial, ovarian, liver, or uterine cancer  diabetes  gallbladder disease  heart disease or recent heart attack  high blood pressure  high cholesterol or triglycerides  kidney disease  liver disease  migraine headaches  seizures  stroke  tobacco smoker  an unusual or allergic reaction to etonogestrel, anesthetics or antiseptics, other medicines, foods, dyes, or preservatives  pregnant or trying to get pregnant  breast-feeding How should I use this medicine? This device is inserted just under the skin on the inner side of your upper arm by a health care professional. Talk to your pediatrician regarding the use of this medicine in children. Special care may be needed. Overdosage: If you think you have taken too much of this medicine contact a poison control center or emergency room at once. NOTE: This medicine is only for you. Do not share this medicine with others. What if I miss a dose? This does not  apply. What may interact with this medicine? Do not take this medicine with any of the following medications:  amprenavir  fosamprenavir This medicine may also interact with the following medications:  acitretin  aprepitant  armodafinil  bexarotene  bosentan  carbamazepine  certain medicines for fungal infections like fluconazole, ketoconazole, itraconazole and voriconazole  certain medicines to treat hepatitis, HIV or AIDS  cyclosporine  felbamate  griseofulvin  lamotrigine  modafinil  oxcarbazepine  phenobarbital  phenytoin  primidone  rifabutin  rifampin  rifapentine  St. John's wort  topiramate This list may not describe all possible interactions. Give your health care provider a list of all the medicines, herbs, non-prescription drugs, or dietary supplements you use. Also tell them if you smoke, drink alcohol, or use illegal drugs. Some items may interact with your medicine. What should I watch for while using this medicine? This product does not protect you against HIV infection (AIDS) or other sexually transmitted diseases. You should be able to feel the implant by pressing your fingertips over the skin where it was inserted. Contact your doctor if you cannot feel the implant, and use a non-hormonal birth control method (such as condoms) until your doctor confirms that the implant is in place. Contact your doctor if you think that the implant may have broken or become bent while in your arm. You will receive a user card from your health care provider after the implant is inserted. The card is a record of the location of the implant in your upper arm   and when it should be removed. Keep this card with your health records. What side effects may I notice from receiving this medicine? Side effects that you should report to your doctor or health care professional as soon as possible:  allergic reactions like skin rash, itching or hives, swelling of the  face, lips, or tongue  breast lumps, breast tissue changes, or discharge  breathing problems  changes in emotions or moods  if you feel that the implant may have broken or bent while in your arm  high blood pressure  pain, irritation, swelling, or bruising at the insertion site  scar at site of insertion  signs of infection at the insertion site such as fever, and skin redness, pain or discharge  signs and symptoms of a blood clot such as breathing problems; changes in vision; chest pain; severe, sudden headache; pain, swelling, warmth in the leg; trouble speaking; sudden numbness or weakness of the face, arm or leg  signs and symptoms of liver injury like dark yellow or brown urine; general ill feeling or flu-like symptoms; light-colored stools; loss of appetite; nausea; right upper belly pain; unusually weak or tired; yellowing of the eyes or skin  unusual vaginal bleeding, discharge Side effects that usually do not require medical attention (report to your doctor or health care professional if they continue or are bothersome):  acne  breast pain or tenderness  headache  irregular menstrual bleeding  nausea This list may not describe all possible side effects. Call your doctor for medical advice about side effects. You may report side effects to FDA at 1-800-FDA-1088. Where should I keep my medicine? This drug is given in a hospital or clinic and will not be stored at home. NOTE: This sheet is a summary. It may not cover all possible information. If you have questions about this medicine, talk to your doctor, pharmacist, or health care provider.  2020 Elsevier/Gold Standard (2016-12-11 14:11:42)  

## 2018-12-03 LAB — HIV ANTIBODY (ROUTINE TESTING W REFLEX): HIV Screen 4th Generation wRfx: NONREACTIVE

## 2018-12-03 LAB — RPR: RPR Ser Ql: NONREACTIVE

## 2018-12-04 LAB — CERVICOVAGINAL ANCILLARY ONLY
Bacterial Vaginitis (gardnerella): POSITIVE — AB
Candida Glabrata: NEGATIVE
Candida Vaginitis: NEGATIVE
Chlamydia: NEGATIVE
Comment: NEGATIVE
Comment: NEGATIVE
Comment: NEGATIVE
Comment: NEGATIVE
Comment: NEGATIVE
Comment: NORMAL
Neisseria Gonorrhea: NEGATIVE
Trichomonas: NEGATIVE

## 2018-12-08 ENCOUNTER — Other Ambulatory Visit: Payer: Self-pay | Admitting: Women's Health

## 2018-12-08 MED ORDER — METRONIDAZOLE 500 MG PO TABS: 500.0000 mg | ORAL_TABLET | Freq: Two times a day (BID) | ORAL | 0 refills | Status: DC

## 2018-12-10 ENCOUNTER — Telehealth: Payer: Self-pay | Admitting: *Deleted

## 2018-12-10 NOTE — Telephone Encounter (Signed)
Patient informed swab showed BV.  Script sent to pharmacy.  Pt verbalized understanding with no further questions.

## 2018-12-18 ENCOUNTER — Other Ambulatory Visit: Payer: Medicaid Other

## 2018-12-19 NOTE — Progress Notes (Deleted)
Cardiology Office Note    Date:  12/20/2018   ID:  Jacqueline Koch, DOB January 19, 1996, MRN [id number]  PCP:  Patient, No Pcp Per  Cardiologist:  Debbe Odea, MD  Electrophysiologist:  None   Chief Complaint: Follow-up  History of Present Illness:   Jacqueline Koch is a 23 y.o. female with history of syncope and palpitations who presents for follow-up.  Patient was seen in the ED in 11/2018 for syncope.  She reported a history of syncope approximately 3.5 years prior in which she began to feel dizzy and sweaty with associated lightheadedness while attempting to drive.  She parked her car and got out to walk in the driveway and subsequently passed out.  She was brought into her home by her significant other.  No work-up was obtained at that time.  She had not had any further syncopal episodes until mid 11/2018 when she had an episode of syncope while voiding, and possibly having to have a bowel movement.  She later found herself on the floor with her child calling her name.  There were some associated palpitations.  Otherwise, no documented associated symptoms.  Upon coming to, she felt very tired and laid down to sleep until the next morning.  Given concern for symptoms she presented to the ED the 1 day later with BP of 97/83, heart rate 100 bpm, beta-hCG less than 5, potassium 3.7, BUN 8, serum creatinine 0.5, Hgb 12.6, EKG with sinus arrhythmia, 73 bpm, unable to exclude possible diffuse Q waves.  Outpatient follow-up was recommended.  She established with our office on 11/18/2018 noting no prior known cardiac history though did state occasional palpitations occurring approximately 1-2 times per month lasting several seconds at a time.  She denied any immediate family history of heart disease.  EKG showed sinus rhythm with no acute ST-T changes.  Orthostatic vital signs in the office were negative.  It was felt her initial syncopal episode 3 years prior may have been vasovagal in etiology as this  occurred on a warm day with her more recent episode of syncope possibly situational in the setting of micturition and defecation.  Echo and outpatient cardiac monitoring are pending.  ***   Past Medical History:  Diagnosis Date   Nexplanon removal 06/09/2014   Syncope     No past surgical history on file.  Current Medications: No outpatient medications have been marked as taking for the 12/23/18 encounter (Appointment) with Sondra Barges, PA-C.    Allergies:   Patient has no known allergies.   Social History   Socioeconomic History   Marital status: Single    Spouse name: Not on file   Number of children: Not on file   Years of education: in 10th   Highest education level: Not on file  Occupational History   Occupation: Magazine features editor strain: Not on file   Food insecurity    Worry: Not on file    Inability: Not on file   Transportation needs    Medical: Not on file    Non-medical: Not on file  Tobacco Use   Smoking status: Light Tobacco Smoker    Years: 5.00    Types: Cigarettes   Smokeless tobacco: Never Used   Tobacco comment: about 2 cigs per day  Substance and Sexual Activity   Alcohol use: Yes    Alcohol/week: 0.0 standard drinks    Comment: social   Drug use: No   Sexual activity:  Not Currently    Birth control/protection: Implant  Lifestyle   Physical activity    Days per week: Not on file    Minutes per session: Not on file   Stress: Not on file  Relationships   Social connections    Talks on phone: Not on file    Gets together: Not on file    Attends religious service: Not on file    Active member of club or organization: Not on file    Attends meetings of clubs or organizations: Not on file    Relationship status: Not on file  Other Topics Concern   Not on file  Social History Narrative   Not on file     Family History:  The patient's family history includes Arthritis in her paternal  grandmother; Asthma in her brother; Crohn's disease in her maternal grandmother; Heart Problems in her paternal grandmother.  ROS:   ROS   EKGs/Labs/Other Studies Reviewed:    Studies reviewed were summarized above. The additional studies were reviewed today:  2D echo 01/14/2019: Pending  Zio 11/2018:  Pending  EKG:  EKG is ordered today.  The EKG ordered today demonstrates ***  Recent Labs: 11/16/2018: BUN 8; Creatinine, Ser 0.50; Hemoglobin 12.6; Potassium 3.7; Sodium 142  Recent Lipid Panel No results found for: CHOL, TRIG, HDL, CHOLHDL, VLDL, LDLCALC, LDLDIRECT  PHYSICAL EXAM:    VS:  There were no vitals taken for this visit.  BMI: There is no height or weight on file to calculate BMI.  Physical Exam  Wt Readings from Last 3 Encounters:  12/02/18 106 lb 8 oz (48.3 kg)  11/18/18 107 lb (48.5 kg)  11/16/18 119 lb 0.8 oz (54 kg)     ASSESSMENT & PLAN:   1. ***  Disposition: F/u with Dr. Garen Lah or an APP in ***.   Medication Adjustments/Labs and Tests Ordered: Current medicines are reviewed at length with the patient today.  Concerns regarding medicines are outlined above. Medication changes, Labs and Tests ordered today are summarized above and listed in the Patient Instructions accessible in Encounters.   Signed, Christell Faith, PA-C 12/20/2018 2:33 PM     [facility] [address] [place] [address] [place], [place] [address] [phone]

## 2018-12-20 ENCOUNTER — Encounter: Payer: Self-pay | Admitting: Physician Assistant

## 2018-12-23 ENCOUNTER — Ambulatory Visit: Payer: Medicaid Other | Admitting: Physician Assistant

## 2019-01-06 ENCOUNTER — Telehealth: Payer: Self-pay

## 2019-01-06 DIAGNOSIS — I471 Supraventricular tachycardia: Secondary | ICD-10-CM

## 2019-01-06 NOTE — Telephone Encounter (Signed)
Patient made aware of zio monitor results and Dr. Mylo Red- Etang's recommendation. Patient is agreeable with having an EP consult with Dr. Caryl Comes. Advised the patient that a scheduler will call her to schedule.

## 2019-01-06 NOTE — Telephone Encounter (Signed)
-----   Message from Kate Sable, MD sent at 01/05/2019  5:37 PM EST ----- 1 run of SVT noted. Don't think this is her cause for syncope.   Please schedule her to see EP for input.  Thank you

## 2019-01-14 ENCOUNTER — Ambulatory Visit (INDEPENDENT_AMBULATORY_CARE_PROVIDER_SITE_OTHER): Payer: Medicaid Other

## 2019-01-14 ENCOUNTER — Other Ambulatory Visit: Payer: Self-pay

## 2019-01-14 DIAGNOSIS — R55 Syncope and collapse: Secondary | ICD-10-CM | POA: Diagnosis not present

## 2019-01-20 ENCOUNTER — Other Ambulatory Visit: Payer: Self-pay

## 2019-01-20 ENCOUNTER — Encounter: Payer: Self-pay | Admitting: Cardiology

## 2019-01-20 ENCOUNTER — Ambulatory Visit (INDEPENDENT_AMBULATORY_CARE_PROVIDER_SITE_OTHER): Payer: Medicaid Other | Admitting: Cardiology

## 2019-01-20 VITALS — BP 100/60 | HR 75 | Temp 96.6°F | Ht <= 58 in | Wt 109.0 lb

## 2019-01-20 DIAGNOSIS — R002 Palpitations: Secondary | ICD-10-CM

## 2019-01-20 DIAGNOSIS — R55 Syncope and collapse: Secondary | ICD-10-CM

## 2019-01-20 NOTE — Patient Instructions (Signed)
Medication Instructions:  Your physician recommends that you continue on your current medications as directed. Please refer to the Current Medication list given to you today.  *If you need a refill on your cardiac medications before your next appointment, please call your pharmacy*  Lab Work: none If you have labs (blood work) drawn today and your tests are completely normal, you will receive your results only by: Marland Kitchen MyChart Message (if you have MyChart) OR . A paper copy in the mail If you have any lab test that is abnormal or we need to change your treatment, we will call you to review the results.  Testing/Procedures: none  Follow-Up: You have been referred to Dr Caryl Comes with EP.    At [facility], you and your health needs are our priority.  As part of our continuing mission to provide you with exceptional heart care, we have created designated Provider Care Teams.  These Care Teams include your primary Cardiologist (physician) and Advanced Practice Providers (APPs -  Physician Assistants and Nurse Practitioners) who all work together to provide you with the care you need, when you need it.  Your next appointment:   1 month(s) after appointment with Dr Caryl Comes has been done.   The format for your next appointment:   In Person  Provider:    You may see Kate Sable, MD or one of the following Advanced Practice Providers on your designated Care Team:    Murray Hodgkins, NP  Christell Faith, PA-C  Marrianne Mood, PA-C

## 2019-01-20 NOTE — Telephone Encounter (Signed)
Several attempts made to contact for EP appt.  Mailed letter.

## 2019-01-20 NOTE — Telephone Encounter (Signed)
Noted  

## 2019-01-20 NOTE — Progress Notes (Signed)
Cardiology Office Note:    Date:  01/20/2019   ID:  Jacqueline RolesXavier L Timson, DOB 1995/08/07, MRN [id number]  PCP:  Patient, No Pcp Per  Cardiologist:  Debbe OdeaBrian Agbor-Etang, MD  Electrophysiologist:  None   Referring MD: No ref. provider found   Chief Complaint  Patient presents with  . other    Follow up from Zio and Echo. Meds reviewed by the pt. verbally. "doing well."     History of Present Illness:    Jacqueline Koch is a 23 y.o. female with no significant past medical history who presents for follow-up.  She was originally seen due to syncope.  Her symptoms were likely vasovagal in origin because patient was at home, was urinating and thought she may have a bowel movement and then does not remember what happened.  She later found herself on the floor, with her child calling her name and also experienced some palpitations.  She felt really tired upon waking up and laid down and slept till the next morning.  She states having prior episode of syncope about 3-1/2 years ago.  She was about to drive on a very warm day, suddenly felt dizzy, sweaty and lightheaded.  She parked her car walked out onto the driveway and try to walk back into her home.  She passed out and was brought into her home by her significant other.  She denies any history of cardiac disease but states having occasional palpitations about 1-2 times a month lasting for couple of seconds.  She denies any immediate family history of heart disease.  An echocardiogram was ordered and 2-week cardiac monitor was placed.  Orthostatic did not reveal any evidence for orthostasis.  Past Medical History:  Diagnosis Date  . Nexplanon removal 06/09/2014  . Syncope     History reviewed. No pertinent surgical history.  Current Medications: Current Meds  Medication Sig  . etonogestrel (NEXPLANON) 68 MG IMPL implant 1 each by Subdermal route once.     Allergies:   Patient has no known allergies.   Social History   Socioeconomic History  .  Marital status: Single    Spouse name: Not on file  . Number of children: Not on file  . Years of education: in 10th  . Highest education level: Not on file  Occupational History  . Occupation: Consulting civil engineerstudent  Tobacco Use  . Smoking status: Light Tobacco Smoker    Years: 5.00    Types: Cigarettes  . Smokeless tobacco: Never Used  . Tobacco comment: about 2 cigs per day  Substance and Sexual Activity  . Alcohol use: Yes    Alcohol/week: 0.0 standard drinks    Comment: social  . Drug use: No  . Sexual activity: Not Currently    Birth control/protection: Implant  Other Topics Concern  . Not on file  Social History Narrative  . Not on file   Social Determinants of Health   Financial Resource Strain:   . Difficulty of Paying Living Expenses: Not on file  Food Insecurity:   . Worried About Programme researcher, broadcasting/film/videounning Out of Food in the Last Year: Not on file  . Ran Out of Food in the Last Year: Not on file  Transportation Needs:   . Lack of Transportation (Medical): Not on file  . Lack of Transportation (Non-Medical): Not on file  Physical Activity:   . Days of Exercise per Week: Not on file  . Minutes of Exercise per Session: Not on file  Stress:   . Feeling of  Stress : Not on file  Social Connections:   . Frequency of Communication with Friends and Family: Not on file  . Frequency of Social Gatherings with Friends and Family: Not on file  . Attends Religious Services: Not on file  . Active Member of Clubs or Organizations: Not on file  . Attends Banker Meetings: Not on file  . Marital Status: Not on file     Family History: The patient's family history includes Arthritis in her paternal grandmother; Asthma in her brother; Crohn's disease in her maternal grandmother; Heart Problems in her paternal grandmother.  ROS:   Please see the history of present illness.     All other systems reviewed and are negative.  EKGs/Labs/Other Studies Reviewed:    The following studies were  reviewed today: TTE 01/14/2019 1. Left ventricular ejection fraction, by visual estimation, is 60 to 65%. The left ventricle has normal function. There is no left ventricular hypertrophy.  2. The left ventricle has no regional wall motion abnormalities.  3. Global right ventricle has normal systolic function.The right ventricular size is normal. No increase in right ventricular wall thickness.  4. Left atrial size was normal.  5. The tricuspid valve is normal in structure. Tricuspid valve regurgitation is mild  6. Normal pulmonary artery systolic pressure.  2-week cardiac monitor date 12/22/2018 Patient had a min HR of 49 bpm, max HR of 169 bpm, and avg HR of 85 bpm. Predominant underlying rhythm was Sinus Rhythm. 1 run of Supraventricular Tachycardia occurred lasting 7 beats with a max rate of 169 bpm (avg 149 bpm). Supraventricular Tachycardia was detected within +/- 45 seconds of symptomatic patient event(s). Isolated SVEs were rare (<1.0%), SVE Couplets were rare (<1.0%), and no SVE Triplets were present. Isolated VEs were rare (<1.0%), and no VE Couplets or VE Triplets were present.  EKG:  EKG is  ordered today.  The ekg ordered today demonstrates normal sinus rhythm, sinus arrhythmia, normal ECG.  Recent Labs: 11/16/2018: BUN 8; Creatinine, Ser 0.50; Hemoglobin 12.6; Potassium 3.7; Sodium 142  Recent Lipid Panel No results found for: CHOL, TRIG, HDL, CHOLHDL, VLDL, LDLCALC, LDLDIRECT  Physical Exam:    VS:  BP 100/60 (BP Location: Left Arm, Patient Position: Sitting, Cuff Size: Normal)   Pulse 75   Temp (!) 96.6 F (35.9 C)   Ht 4\' 10"  (1.473 m)   Wt 109 lb (49.4 kg)   BMI 22.78 kg/m     Wt Readings from Last 3 Encounters:  01/20/19 109 lb (49.4 kg)  12/02/18 106 lb 8 oz (48.3 kg)  11/18/18 107 lb (48.5 kg)     GEN:  Well nourished, well developed in no acute distress HEENT: Normal NECK: No JVD; No carotid bruits LYMPHATICS: No lymphadenopathy CARDIAC: RRR, no  murmurs, rubs, gallops RESPIRATORY:  Clear to auscultation without rales, wheezing or rhonchi  ABDOMEN: Soft, non-tender, non-distended MUSCULOSKELETAL:  No edema; No deformity  SKIN: Warm and dry NEUROLOGIC:  Alert and oriented x 3 PSYCHIATRIC:  Normal affect   ASSESSMENT:   Patient with history of syncope, symptoms consistent with vasovagal.  Echocardiogram showed normal ejection fraction normal diastolic function.  Cardiac monitor did reveal one episode of 7 beats of SVT.  I do not think this will cause episodes of syncope.  1. Palpitations   2. Syncope, unspecified syncope type    PLAN:    In order of problems listed above:  1. 7 beat run of SVT noted.  I do not think this  will cause syncope.  Will schedule patient with EP for additional input.  W ill hold off on beta-blocker for now. 2. Likely vasovagal.  Echocardiogram with no structural abnormalities.  SVT as stated above not likely to cause syncope.   Follow-up after EP input  Total encounter time more than 40 minutes  Greater than 50% was spent in counseling and coordination of care with the patient  This note was generated in part or whole with voice recognition software. Voice recognition is usually quite accurate but there are transcription errors that can and very often do occur. I apologize for any typographical errors that were not detected and corrected.  Medication Adjustments/Labs and Tests Ordered: Current medicines are reviewed at length with the patient today.  Concerns regarding medicines are outlined above.  Orders Placed This Encounter  Procedures  . EKG 12-Lead   No orders of the defined types were placed in this encounter.   Patient Instructions  Medication Instructions:  Your physician recommends that you continue on your current medications as directed. Please refer to the Current Medication list given to you today.  *If you need a refill on your cardiac medications before your next appointment,  please call your pharmacy*  Lab Work: none If you have labs (blood work) drawn today and your tests are completely normal, you will receive your results only by: Marland Kitchen MyChart Message (if you have MyChart) OR . A paper copy in the mail If you have any lab test that is abnormal or we need to change your treatment, we will call you to review the results.  Testing/Procedures: none  Follow-Up: You have been referred to Dr Caryl Comes with EP.    At [facility], you and your health needs are our priority.  As part of our continuing mission to provide you with exceptional heart care, we have created designated Provider Care Teams.  These Care Teams include your primary Cardiologist (physician) and Advanced Practice Providers (APPs -  Physician Assistants and Nurse Practitioners) who all work together to provide you with the care you need, when you need it.  Your next appointment:   1 month(s) after appointment with Dr Caryl Comes has been done.   The format for your next appointment:   In Person  Provider:    You may see Kate Sable, MD or one of the following Advanced Practice Providers on your designated Care Team:    Murray Hodgkins, NP  Christell Faith, PA-C  Marrianne Mood, PA-C     Signed, Kate Sable, MD  01/20/2019 9:29 AM    [place]

## 2019-02-03 ENCOUNTER — Other Ambulatory Visit: Payer: Medicaid Other | Admitting: Women's Health

## 2019-03-05 ENCOUNTER — Ambulatory Visit: Payer: Medicaid Other | Admitting: Internal Medicine

## 2019-03-06 ENCOUNTER — Encounter: Payer: Self-pay | Admitting: Internal Medicine

## 2019-04-08 ENCOUNTER — Ambulatory Visit: Payer: Medicaid Other | Attending: Internal Medicine

## 2019-04-08 ENCOUNTER — Other Ambulatory Visit: Payer: Self-pay

## 2019-04-08 DIAGNOSIS — Z20822 Contact with and (suspected) exposure to covid-19: Secondary | ICD-10-CM | POA: Diagnosis not present

## 2019-04-09 LAB — NOVEL CORONAVIRUS, NAA: SARS-CoV-2, NAA: NOT DETECTED

## 2019-06-03 ENCOUNTER — Emergency Department (HOSPITAL_COMMUNITY)
Admission: EM | Admit: 2019-06-03 | Discharge: 2019-06-03 | Disposition: A | Discharge status: Home / Self Care | Payer: Medicaid Other | Attending: Emergency Medicine | Admitting: Emergency Medicine

## 2019-06-03 ENCOUNTER — Encounter (HOSPITAL_COMMUNITY): Payer: Self-pay | Admitting: Emergency Medicine

## 2019-06-03 ENCOUNTER — Other Ambulatory Visit: Payer: Self-pay

## 2019-06-03 DIAGNOSIS — J029 Acute pharyngitis, unspecified: Secondary | ICD-10-CM | POA: Diagnosis present

## 2019-06-03 DIAGNOSIS — Z20822 Contact with and (suspected) exposure to covid-19: Secondary | ICD-10-CM | POA: Insufficient documentation

## 2019-06-03 DIAGNOSIS — F17210 Nicotine dependence, cigarettes, uncomplicated: Secondary | ICD-10-CM | POA: Insufficient documentation

## 2019-06-03 DIAGNOSIS — J069 Acute upper respiratory infection, unspecified: Secondary | ICD-10-CM | POA: Insufficient documentation

## 2019-06-03 LAB — SARS CORONAVIRUS 2 (TAT 6-24 HRS): SARS Coronavirus 2: NEGATIVE

## 2019-06-03 NOTE — Discharge Instructions (Signed)
Isolate until you get your Covid test resolved in the next 24 hours, they will call you only if it is positive. Use Tylenol as needed for body aches.  Stay well-hydrated.  Return for shortness of breath or new symptoms.

## 2019-06-03 NOTE — ED Triage Notes (Signed)
Sore throat and cough x 1 week.

## 2019-06-03 NOTE — ED Provider Notes (Signed)
[facility] EMERGENCY DEPARTMENT Provider Note   CSN: [id number] Arrival date & time: 06/03/19  3382     History Chief Complaint  Patient presents with  . Sore Throat    Jacqueline Koch is a 24 y.o. female.  Patient with no significant medical history presents with cough, drainage and mild sore throat for 1 week.  Family member with mild cough recently.  No fevers or chills.  No shortness of breath.  No known Covid contacts.        Past Medical History:  Diagnosis Date  . Nexplanon removal 06/09/2014  . Syncope     Patient Active Problem List   Diagnosis Date Noted  . Nexplanon insertion 08/22/2015  . Susceptible to varicella (non-immune), currently pregnant 12/13/2014  . Marijuana use 12/08/2014  . Chlamydia infection 01/20/2013  . Adolescent scoliosis 12/01/2012  . Abnormal vision screen 12/01/2012  . Hearing deficit 12/01/2012  . Scoliosis 11/02/2010  . Back pain 11/02/2010    History reviewed. No pertinent surgical history.   OB History    Gravida  1   Para  1   Term  1   Preterm  0   AB  0   Living  1     SAB  0   TAB  0   Ectopic  0   Multiple  0   Live Births  1           Family History  Problem Relation Age of Onset  . Asthma Brother   . Arthritis Paternal Grandmother   . Heart Problems Paternal Grandmother   . Crohn's disease Maternal Grandmother     Social History   Tobacco Use  . Smoking status: Light Tobacco Smoker    Years: 5.00    Types: Cigarettes  . Smokeless tobacco: Never Used  . Tobacco comment: about 2 cigs per day  Substance Use Topics  . Alcohol use: Yes    Alcohol/week: 0.0 standard drinks    Comment: social  . Drug use: No    Home Medications Prior to Admission medications   Medication Sig Start Date End Date Taking? Authorizing Provider  etonogestrel (NEXPLANON) 68 MG IMPL implant 1 each by Subdermal route once.    [provider]    Allergies    Penicillins  Review of Systems     Review of Systems  Constitutional: Negative for chills and fever.  HENT: Positive for congestion.   Respiratory: Positive for cough. Negative for shortness of breath.   Cardiovascular: Negative for chest pain.  Gastrointestinal: Negative for abdominal pain and vomiting.  Genitourinary: Negative for dysuria and flank pain.  Musculoskeletal: Negative for back pain, neck pain and neck stiffness.  Skin: Negative for rash.  Neurological: Negative for light-headedness and headaches.    Physical Exam Updated Vital Signs BP 99/65 (BP Location: Right Arm)   Pulse 73   Temp 98.5 F (36.9 C) (Oral)   Resp 18   Ht 4\' 10"  (1.473 m)   Wt 50.8 kg   SpO2 100%   BMI 23.41 kg/m   Physical Exam Vitals and nursing note reviewed.  Constitutional:      Appearance: She is well-developed.  HENT:     Head: Normocephalic and atraumatic.     Nose: Congestion and rhinorrhea present.     Mouth/Throat:     Mouth: Mucous membranes are moist. No oral lesions.     Pharynx: No pharyngeal swelling, oropharyngeal exudate, posterior oropharyngeal erythema or uvula swelling.  Eyes:  General:        Right eye: No discharge.        Left eye: No discharge.     Conjunctiva/sclera: Conjunctivae normal.  Neck:     Trachea: No tracheal deviation.  Cardiovascular:     Rate and Rhythm: Normal rate and regular rhythm.  Pulmonary:     Effort: Pulmonary effort is normal.     Breath sounds: Normal breath sounds.  Abdominal:     General: There is no distension.     Palpations: Abdomen is soft.  Musculoskeletal:     Cervical back: Normal range of motion and neck supple.  Skin:    General: Skin is warm.     Findings: No rash.  Neurological:     Mental Status: She is alert and oriented to person, place, and time.     ED Results / Procedures / Treatments   Labs (all labs ordered are listed, but only abnormal results are displayed) Labs Reviewed  RESPIRATORY PANEL BY RT PCR (FLU A&B, COVID)     EKG None  Radiology No results found.  Procedures Procedures (including critical care time)  Medications Ordered in ED Medications - No data to display  ED Course  I have reviewed the triage vital signs and the nursing notes.  Pertinent labs & imaging results that were available during my care of the patient were reviewed by me and considered in my medical decision making (see chart for details).    MDM Rules/Calculators/A&P                      Overall well-appearing patient presents with clinically upper respiratory infection/viral respiratory symptoms.  Normal work of breathing, clear lungs.  Discussed Covid test, work note and follow-up results in 24 hours.  Jacqueline Koch was evaluated in Emergency Department on 06/03/2019 for the symptoms described in the history of present illness. She was evaluated in the context of the global COVID-19 pandemic, which necessitated consideration that the patient might be at risk for infection with the SARS-CoV-2 virus that causes COVID-19. Institutional protocols and algorithms that pertain to the evaluation of patients at risk for COVID-19 are in a state of rapid change based on information released by regulatory bodies including the CDC and federal and state organizations. These policies and algorithms were followed during the patient's care in the ED.  Final Clinical Impression(s) / ED Diagnoses Final diagnoses:  Acute upper respiratory infection    Rx / DC Orders ED Discharge Orders    None       Blane Ohara, MD 06/03/19 0945

## 2019-09-29 DIAGNOSIS — A5901 Trichomonal vulvovaginitis: Secondary | ICD-10-CM | POA: Diagnosis not present

## 2019-09-29 DIAGNOSIS — N760 Acute vaginitis: Secondary | ICD-10-CM | POA: Diagnosis not present

## 2019-10-24 ENCOUNTER — Other Ambulatory Visit: Payer: Medicaid Other

## 2019-10-24 ENCOUNTER — Other Ambulatory Visit: Payer: Self-pay

## 2019-10-24 DIAGNOSIS — Z20822 Contact with and (suspected) exposure to covid-19: Secondary | ICD-10-CM

## 2019-10-25 DIAGNOSIS — Z20822 Contact with and (suspected) exposure to covid-19: Secondary | ICD-10-CM | POA: Diagnosis not present

## 2019-10-27 LAB — SPECIMEN STATUS REPORT

## 2019-10-27 LAB — SARS-COV-2, NAA 2 DAY TAT

## 2019-10-27 LAB — NOVEL CORONAVIRUS, NAA: SARS-CoV-2, NAA: NOT DETECTED

## 2020-01-12 ENCOUNTER — Other Ambulatory Visit: Payer: Self-pay

## 2020-01-12 ENCOUNTER — Other Ambulatory Visit: Payer: Medicaid Other

## 2020-01-12 DIAGNOSIS — Z20822 Contact with and (suspected) exposure to covid-19: Secondary | ICD-10-CM | POA: Diagnosis not present

## 2020-01-13 LAB — NOVEL CORONAVIRUS, NAA: SARS-CoV-2, NAA: NOT DETECTED

## 2020-01-13 LAB — SPECIMEN STATUS REPORT

## 2020-01-13 LAB — SARS-COV-2, NAA 2 DAY TAT

## 2020-02-23 ENCOUNTER — Emergency Department (HOSPITAL_COMMUNITY): Payer: Medicaid Other

## 2020-02-23 ENCOUNTER — Other Ambulatory Visit: Payer: Self-pay

## 2020-02-23 ENCOUNTER — Encounter (HOSPITAL_COMMUNITY): Payer: Self-pay | Admitting: *Deleted

## 2020-02-23 ENCOUNTER — Emergency Department (HOSPITAL_COMMUNITY)
Admission: EM | Admit: 2020-02-23 | Discharge: 2020-02-23 | Disposition: A | Discharge status: Home / Self Care | Payer: Medicaid Other | Attending: Emergency Medicine | Admitting: Emergency Medicine

## 2020-02-23 DIAGNOSIS — W19XXXA Unspecified fall, initial encounter: Secondary | ICD-10-CM

## 2020-02-23 DIAGNOSIS — S42022A Displaced fracture of shaft of left clavicle, initial encounter for closed fracture: Secondary | ICD-10-CM | POA: Insufficient documentation

## 2020-02-23 DIAGNOSIS — S42115A Nondisplaced fracture of body of scapula, left shoulder, initial encounter for closed fracture: Secondary | ICD-10-CM | POA: Diagnosis not present

## 2020-02-23 DIAGNOSIS — W000XXA Fall on same level due to ice and snow, initial encounter: Secondary | ICD-10-CM | POA: Insufficient documentation

## 2020-02-23 DIAGNOSIS — S42102A Fracture of unspecified part of scapula, left shoulder, initial encounter for closed fracture: Secondary | ICD-10-CM | POA: Diagnosis not present

## 2020-02-23 DIAGNOSIS — S4982XA Other specified injuries of left shoulder and upper arm, initial encounter: Secondary | ICD-10-CM | POA: Diagnosis present

## 2020-02-23 DIAGNOSIS — S42025A Nondisplaced fracture of shaft of left clavicle, initial encounter for closed fracture: Secondary | ICD-10-CM | POA: Diagnosis not present

## 2020-02-23 DIAGNOSIS — F17210 Nicotine dependence, cigarettes, uncomplicated: Secondary | ICD-10-CM | POA: Insufficient documentation

## 2020-02-23 DIAGNOSIS — S42002A Fracture of unspecified part of left clavicle, initial encounter for closed fracture: Secondary | ICD-10-CM | POA: Diagnosis not present

## 2020-02-23 LAB — I-STAT CHEM 8, ED
BUN: 4 mg/dL — ABNORMAL LOW (ref 6–20)
Calcium, Ion: 1.25 mmol/L (ref 1.15–1.40)
Chloride: 106 mmol/L (ref 98–111)
Creatinine, Ser: 0.5 mg/dL (ref 0.44–1.00)
Glucose, Bld: 98 mg/dL (ref 70–99)
HCT: 36 % (ref 36.0–46.0)
Hemoglobin: 12.2 g/dL (ref 12.0–15.0)
Potassium: 3.7 mmol/L (ref 3.5–5.1)
Sodium: 142 mmol/L (ref 135–145)
TCO2: 26 mmol/L (ref 22–32)

## 2020-02-23 LAB — I-STAT BETA HCG BLOOD, ED (MC, WL, AP ONLY): I-stat hCG, quantitative: <5 m[IU]/mL (ref <5)

## 2020-02-23 MED ORDER — OXYCODONE-ACETAMINOPHEN 5-325 MG PO TABS
1.0000 | ORAL_TABLET | Freq: Once | ORAL | Status: AC
  Administered 2020-02-23: 1 via ORAL
  Filled 2020-02-23: qty 1

## 2020-02-23 MED ORDER — IOHEXOL 300 MG/ML  SOLN
75.0000 mL | Freq: Once | INTRAMUSCULAR | Status: AC | PRN
  Administered 2020-02-23: 75 mL via INTRAVENOUS

## 2020-02-23 NOTE — ED Triage Notes (Signed)
Pt slipped on ice and fell, now c/o left shoulder pain.  Denies hitting her head.

## 2020-02-23 NOTE — Discharge Instructions (Addendum)
You are seen here after a fall.  Imaging shows that you have a scapula and clavicle fracture.  I have placed you in a splint please wear.  You may take over-the-counter pain medications like ibuprofen or Tylenol every 6 as needed please call dosing the back of bottle.  I recommend that he follow-up with orthopedics for further evaluation management of your fractures.  Come back to the emergency department if you develop chest pain, shortness of breath, severe abdominal pain, uncontrolled nausea, vomiting, diarrhea.

## 2020-02-23 NOTE — ED Provider Notes (Signed)
[facility] EMERGENCY DEPARTMENT Provider Note   CSN: [id number] Arrival date & time: 02/23/20  1307     History Chief Complaint  Patient presents with  . Fall    [facility] is a 25 y.o. female.  HPI   Patient with no significant medical history presents to the emergency department with chief complaint of left shoulder and left back pain.  Patient states it started today after she slipped and fell on the ice.  She states she landed on her left side, she denies hitting her head, losing conscious, is not on anticoags.  Patient endorses that she has severe pain in her left arm but denies any numbness or tingling in her hand, she is able to move her fingers wrist and elbow without difficulty.  She denies any shortness of breath, chest pain, has no other complaints at this time.  She denies  alleviating factors, has not taken any pain medications.  Patient denies headaches, fevers, chills, shortness of breath, chest pain, abdominal pain, nausea, vomiting, diarrhea, pedal edema.  Past Medical History:  Diagnosis Date  . Nexplanon removal 06/09/2014  . Syncope     Patient Active Problem List   Diagnosis Date Noted  . Nexplanon insertion 08/22/2015  . Susceptible to varicella (non-immune), currently pregnant 12/13/2014  . Marijuana use 12/08/2014  . Chlamydia infection 01/20/2013  . Adolescent scoliosis 12/01/2012  . Abnormal vision screen 12/01/2012  . Hearing deficit 12/01/2012  . Scoliosis 11/02/2010  . Back pain 11/02/2010    History reviewed. No pertinent surgical history.   OB History    Gravida  1   Para  1   Term  1   Preterm  0   AB  0   Living  1     SAB  0   IAB  0   Ectopic  0   Multiple  0   Live Births  1           Family History  Problem Relation Age of Onset  . Asthma Brother   . Arthritis Paternal Grandmother   . Heart Problems Paternal Grandmother   . Crohn's disease Maternal Grandmother     Social History   Tobacco Use  .  Smoking status: Light Tobacco Smoker    Years: 5.00    Types: Cigarettes  . Smokeless tobacco: Never Used  . Tobacco comment: about 2 cigs per day  Vaping Use  . Vaping Use: Never used  Substance Use Topics  . Alcohol use: Yes    Alcohol/week: 0.0 standard drinks    Comment: social  . Drug use: No    Home Medications Prior to Admission medications   Medication Sig Start Date End Date Taking? Authorizing Provider  etonogestrel (NEXPLANON) 68 MG IMPL implant 1 each by Subdermal route once.    [provider]    Allergies    Penicillins  Review of Systems   Review of Systems  Constitutional: Negative for chills and fever.  HENT: Negative for congestion.   Respiratory: Negative for shortness of breath.   Cardiovascular: Negative for chest pain.  Gastrointestinal: Negative for abdominal pain.  Genitourinary: Negative for enuresis.  Musculoskeletal: Negative for back pain.       Left shoulder left back pain.  Skin: Negative for rash.  Neurological: Negative for dizziness and headaches.  Hematological: Does not bruise/bleed easily.    Physical Exam Updated Vital Signs BP 122/79 (BP Location: Right Arm)   Pulse 63   Temp 97.8  F (36.6 C) (Oral)   Resp 18   Ht 4\' 10"  (1.473 m)   Wt 54.4 kg   SpO2 100%   BMI 25.08 kg/m   Physical Exam Vitals and nursing note reviewed.  Constitutional:      General: She is not in acute distress.    Appearance: She is not ill-appearing.  HENT:     Head: Normocephalic and atraumatic.     Nose: No congestion.  Eyes:     Conjunctiva/sclera: Conjunctivae normal.  Cardiovascular:     Rate and Rhythm: Normal rate and regular rhythm.     Pulses: Normal pulses.     Heart sounds: No murmur heard. No friction rub. No gallop.   Pulmonary:     Effort: No respiratory distress.     Breath sounds: No wheezing, rhonchi or rales.  Musculoskeletal:        General: Tenderness present. No deformity.     Comments: Patient's left  shoulder is visualized there is no deformities present, she had full range of motion at her fingers, wrist, elbow, decreased range of motion at her shoulder suspect this secondary to pain.  She was tender to palpation along the middle of her left clavicle, she is also tender to palpation along her left scapula.  No deformities present.  Neurovascular intact in the left arm.  Spine was palpated is nontender to palpation, no step-off or deformities noted.  Skin:    General: Skin is warm and dry.  Neurological:     Mental Status: She is alert.  Psychiatric:        Mood and Affect: Mood normal.     ED Results / Procedures / Treatments   Labs (all labs ordered are listed, but only abnormal results are displayed) Labs Reviewed  I-STAT CHEM 8, ED - Abnormal; Notable for the following components:      Result Value   BUN 4 (*)    All other components within normal limits  I-STAT BETA HCG BLOOD, ED ([facility], [facility], [facility] ONLY)    EKG None  Radiology CT Chest W Contrast  Result Date: 02/23/2020 CLINICAL DATA:  slipped and fell on ice earlier today, she has severe left clavicle fracture and scapular fracture concern for possible vascular damage Pt unable to raise left arm above head during imaging EXAM: CT CHEST WITH CONTRAST TECHNIQUE: Multidetector CT imaging of the chest was performed during intravenous contrast administration. CONTRAST:  75mL OMNIPAQUE IOHEXOL 300 MG/ML  SOLN COMPARISON:  None. FINDINGS: CHEST: Ports and Devices: None. Lungs/airways: Calcified granuloma within the right upper lobe. No focal consolidation. No pulmonary nodule. No pulmonary mass. No pulmonary contusion or laceration. No pneumatocele formation. The central airways are patent. Pleura: No pleural effusion. No pneumothorax. No hemothorax. Lymph Nodes: No mediastinal, hilar, or axillary lymphadenopathy. Mediastinum: Soft tissue density within the anterior mediastinum likely residual thymus. No pneumomediastinum. No aortic injury  or mediastinal hematoma. The thoracic aorta is normal in caliber. The heart is normal in size. No significant pericardial effusion. The esophagus is unremarkable. The thyroid is unremarkable. Chest Wall / Breasts: No chest wall mass. Visualized upper abdomen: Subcentimeter hypodensity within the liver too small to characterize. Otherwise no acute abnormality. Musculoskeletal: Inferior apex angulated left mid clavicular fracture. Poor visualization of a known left scapular body nondisplaced fracture. Chronic appearing compression fracture of the T11 vertebral body with greater than 35% height loss centrally. No acute spinal fracture. Other: No definite vascular injury of the left subclavian vessels. IMPRESSION: 1. No acute  traumatic injury to the chest. 2. No acute fracture or traumatic malalignment of the thoracic spine in a patient with a chronic appearing T11 compression fracture. 3. Inferior apex angulated left mid clavicular fracture with no definite vascular injury of the left subclavian vessels. Please note limited evaluation due to timing of contrast and type of study ordered. Electronically Signed   By: Tish Frederickson M.D.   On: 02/23/2020 18:24   DG Shoulder Left  Result Date: 02/23/2020 CLINICAL DATA:  Severe left Scholl pain, fell on ice EXAM: LEFT SHOULDER - 2+ VIEW COMPARISON:  None FINDINGS: Osseous mineralization normal. AC joint alignment normal. Angulated fracture at mid LEFT clavicle, apex caudal. Additional nondisplaced fracture involving the body of the scapula. Visualized LEFT ribs intact. No glenohumeral dislocation or humeral fracture identified. IMPRESSION: Nondisplaced LEFT scapular body fracture. Angulated fracture LEFT clavicle at middle third of shaft Electronically Signed   By: Ulyses Southward M.D.   On: 02/23/2020 14:14    Procedures Procedures (including critical care time)  Medications Ordered in ED Medications  oxyCODONE-acetaminophen (PERCOCET/ROXICET) 5-325 MG per tablet 1  tablet (1 tablet Oral Given 02/23/20 1632)  iohexol (OMNIPAQUE) 300 MG/ML solution 75 mL (75 mLs Intravenous Contrast Given 02/23/20 1728)    ED Course  I have reviewed the triage vital signs and the nursing notes.  Pertinent labs & imaging results that were available during my care of the patient were reviewed by me and considered in my medical decision making (see chart for details).    MDM Rules/Calculators/A&P                          Patient presents with left shoulder pain, she is alert, does not appear in acute distress, vital signs reassuring.  Will obtain imaging of left shoulder for further evaluation.  Imaging shows nondisplaced left scapular body fracture, also shows angulated fracture of the left clavicle at the mid third of the shaft.  I-STAT CHEM shows no electrolyte abnormalities, no AKI, hCG less than 5.  Due to severity of clavicle fracture and fracture of scapula and scapular body concern for other fractures and/or damage to the vasculature within the chest will obtain CT chest for further evaluation.  CT chest shows no acute fracture or traumatic malalignment of the thoracic spine, shows inferior apex angulation of the left mid clavicular fracture with no definite vascular injury of the left subclavian vessels.  Will place patient in a sling and and have her follow-up with orthopedic for further evaluation.  Low suspicion for CVA or intracranial head bleed as patient denies change in vision, paresthesias in the upper or lower extremities, no neurodeficits on my exam.  Low suspicion for pneumothorax or rib fracture as imaging is negative, lung sounds are clear bilaterally.  Low suspicion for spinal cord abnormality or spinal cord fracture as spine was palpated nontender to palpation no deformities noted, moving all 4 extremities out difficulty.  Low suspicion for vascular injury as CT chest does not reveal any acute findings.  I suspect patient suffering from a mechanical fall  resulting in a fracture of her scapula and clavicle we will place her in a sling and have her follow-up with Ortho for further evaluation.  Vital signs have remained stable, no indication for hospital admission.   Patient given at home care as well strict return precautions.  Patient verbalized that they understood agreed to said plan.  Final Clinical Impression(s) / ED Diagnoses Final diagnoses:  Fall, initial encounter  Closed nondisplaced fracture of body of left scapula, initial encounter  Closed displaced fracture of shaft of left clavicle, initial encounter    Rx / DC Orders ED Discharge Orders    None       Carroll Sage, PA-C 02/23/20 1845    Derwood Kaplan, MD 02/24/20 2102

## 2020-02-24 ENCOUNTER — Telehealth: Payer: Self-pay

## 2020-02-24 NOTE — Telephone Encounter (Signed)
Transition Care Management Unsuccessful Follow-up Telephone Call  Date of discharge and from where:  02/23/2020 [facility]  Attempts:  1st Attempt  Reason for unsuccessful TCM follow-up call:  Left voice message    

## 2020-02-25 NOTE — Telephone Encounter (Signed)
Transition Care Management Unsuccessful Follow-up Telephone Call  Date of discharge and from where:  02/23/[facility]  Attempts:  2nd Attempt  Reason for unsuccessful TCM follow-up call:  Unable to leave message

## 2020-02-29 NOTE — Telephone Encounter (Signed)
Transition Care Management Unsuccessful Follow-up Telephone Call  Date of discharge and from where:  02/23/2020 [facility]  Attempts:  3rd Attempt  Reason for unsuccessful TCM follow-up call:  Unable to leave message   Patient has an appointment with [facility] 03/01/2020 at 2pm.

## 2020-03-01 ENCOUNTER — Ambulatory Visit: Payer: Medicaid Other

## 2020-03-01 ENCOUNTER — Other Ambulatory Visit: Payer: Self-pay

## 2020-03-01 ENCOUNTER — Encounter: Payer: Self-pay | Admitting: Orthopedic Surgery

## 2020-03-01 ENCOUNTER — Ambulatory Visit (INDEPENDENT_AMBULATORY_CARE_PROVIDER_SITE_OTHER): Payer: Medicaid Other | Admitting: Orthopedic Surgery

## 2020-03-01 VITALS — Ht <= 58 in | Wt 127.0 lb

## 2020-03-01 DIAGNOSIS — S42115A Nondisplaced fracture of body of scapula, left shoulder, initial encounter for closed fracture: Secondary | ICD-10-CM

## 2020-03-01 DIAGNOSIS — S42002A Fracture of unspecified part of left clavicle, initial encounter for closed fracture: Secondary | ICD-10-CM | POA: Diagnosis not present

## 2020-03-01 DIAGNOSIS — S42025A Nondisplaced fracture of shaft of left clavicle, initial encounter for closed fracture: Secondary | ICD-10-CM

## 2020-03-01 NOTE — Progress Notes (Signed)
EMERGENCY ROOM FOLLOW UP  NEW PROBLEM/PATIENT   Patient ID: Jac[facility], female   DOB: 06-23-1995, 25 y.o.   MRN: [id number]   ASSESSMENT AND PLAN:  25 year old with scapular body fracture and left clavicle fracture nondisplaced, treated with sling for comfort x-ray in 4 weeks    Emergency room record from (date) February 23, 2020 has been reviewed and this is included by reference and includes the review of systems with the following addition:   Chief Complaint  Patient presents with  . Clavicle Injury    Left clavicle fracture, DOI 02-23-20.    HPI Jac[facility] Koch is a 25 y.o. female.  2 injuries to the left shoulder left clavicle left scapula  25 year old fell backwards and was out landed awkwardly injured her left shoulder with a left clavicle fracture left scapular body fracture treated with a sling neurovascular intact was a low-energy injury she is much more comfortable now 1 week after the injury   Review of Systems Review of Systems  Constitutional: Negative for fever.  Neurological: Negative for weakness and numbness.     has a past medical history of Nexplanon removal (06/09/2014) and Syncope.   No past surgical history on file.  Family History  Problem Relation Age of Onset  . Asthma Brother   . Arthritis Paternal Grandmother   . Heart Problems Paternal Grandmother   . Crohn's disease Maternal Grandmother     Social History Social History   Tobacco Use  . Smoking status: Light Tobacco Smoker    Years: 5.00    Types: Cigarettes  . Smokeless tobacco: Never Used  . Tobacco comment: about 2 cigs per day  Vaping Use  . Vaping Use: Never used  Substance Use Topics  . Alcohol use: Yes    Alcohol/week: 0.0 standard drinks    Comment: social  . Drug use: No    Allergies  Allergen Reactions  . Penicillins     Pt states she is not sure, she has been allergic since she was younger     Current Outpatient Medications  Medication Sig Dispense Refill  .  etonogestrel (NEXPLANON) 68 MG IMPL implant 1 each by Subdermal route once.     No current facility-administered medications for this visit.    Physical Exam Ht 4\' 10"  (1.473 m)   Wt 127 lb (57.6 kg)   BMI 26.54 kg/m  Body mass index is 26.54 kg/m.  Well developed and well nourished  Stands with normal weight bearing line normal Alert and oriented x 3  Normal affect and mood   GAIT:  Normal (STRIMVS)  Left shoulder skin is normal left arm skin is normal.  Patient is tender over the scapular body and mid clavicle Range of motion is diminished for pain Instability x-ray is normal Muscle tone is excellent without tremor Pulses are good Sensation is normal  Data Reviewed February 23, 2020 [facility] IMAGING From THE ER AND THE REPORT ARE REVIEWED, MY INTERPRETATION OF THE IMAGE(S) IS : First images show a scapular body fracture with a clavicle fracture  I repeated the clavicle fracture because the first x-rays were just of the shoulder our images in the office show a nondisplaced midshaft clavicle fracture  Assessment  Encounter Diagnosis  Name Primary?  . Closed nondisplaced fracture of left clavicle, unspecified part of clavicle, initial encounter Yes     Plan   Sling   [facility], MD 03/01/2020 2:32 PM

## 2020-03-01 NOTE — Patient Instructions (Signed)
Notes:   2 weeks no work

## 2020-03-18 ENCOUNTER — Encounter (HOSPITAL_COMMUNITY): Payer: Self-pay | Admitting: *Deleted

## 2020-03-18 ENCOUNTER — Emergency Department (HOSPITAL_COMMUNITY)
Admission: EM | Admit: 2020-03-18 | Discharge: 2020-03-18 | Disposition: A | Discharge status: Home / Self Care | Payer: Medicaid Other | Attending: Emergency Medicine | Admitting: Emergency Medicine

## 2020-03-18 ENCOUNTER — Other Ambulatory Visit: Payer: Self-pay

## 2020-03-18 ENCOUNTER — Emergency Department (HOSPITAL_COMMUNITY): Payer: Medicaid Other

## 2020-03-18 DIAGNOSIS — F17210 Nicotine dependence, cigarettes, uncomplicated: Secondary | ICD-10-CM | POA: Insufficient documentation

## 2020-03-18 DIAGNOSIS — N201 Calculus of ureter: Secondary | ICD-10-CM | POA: Diagnosis not present

## 2020-03-18 DIAGNOSIS — N200 Calculus of kidney: Secondary | ICD-10-CM | POA: Diagnosis not present

## 2020-03-18 DIAGNOSIS — R1031 Right lower quadrant pain: Secondary | ICD-10-CM | POA: Diagnosis present

## 2020-03-18 DIAGNOSIS — N210 Calculus in bladder: Secondary | ICD-10-CM | POA: Diagnosis not present

## 2020-03-18 DIAGNOSIS — N1330 Unspecified hydronephrosis: Secondary | ICD-10-CM | POA: Diagnosis not present

## 2020-03-18 DIAGNOSIS — R600 Localized edema: Secondary | ICD-10-CM | POA: Diagnosis not present

## 2020-03-18 LAB — URINALYSIS, ROUTINE W REFLEX MICROSCOPIC
Bilirubin Urine: NEGATIVE
Glucose, UA: NEGATIVE mg/dL
Ketones, ur: 80 mg/dL — AB
Nitrite: NEGATIVE
Protein, ur: 100 mg/dL — AB
Specific Gravity, Urine: 1.031 — ABNORMAL HIGH (ref 1.005–1.030)
pH: 5 (ref 5.0–8.0)

## 2020-03-18 LAB — COMPREHENSIVE METABOLIC PANEL
ALT: 10 U/L (ref 0–44)
AST: 15 U/L (ref 15–41)
Albumin: 4.5 g/dL (ref 3.5–5.0)
Alkaline Phosphatase: 74 U/L (ref 38–126)
Anion gap: 8 (ref 5–15)
BUN: 13 mg/dL (ref 6–20)
CO2: 24 mmol/L (ref 22–32)
Calcium: 9.3 mg/dL (ref 8.9–10.3)
Chloride: 105 mmol/L (ref 98–111)
Creatinine, Ser: 0.71 mg/dL (ref 0.44–1.00)
GFR, Estimated: >60 mL/min (ref >60)
Glucose, Bld: 124 mg/dL — ABNORMAL HIGH (ref 70–99)
Potassium: 3.5 mmol/L (ref 3.5–5.1)
Sodium: 137 mmol/L (ref 135–145)
Total Bilirubin: 0.4 mg/dL (ref 0.3–1.2)
Total Protein: 7.8 g/dL (ref 6.5–8.1)

## 2020-03-18 LAB — CBC
HCT: 36 % (ref 36.0–46.0)
Hemoglobin: 12 g/dL (ref 12.0–15.0)
MCH: 32.1 pg (ref 26.0–34.0)
MCHC: 33.3 g/dL (ref 30.0–36.0)
MCV: 96.3 fL (ref 80.0–100.0)
Platelets: 170 10*3/uL (ref 150–400)
RBC: 3.74 MIL/uL — ABNORMAL LOW (ref 3.87–5.11)
RDW: 12.2 % (ref 11.5–15.5)
WBC: 8.5 10*3/uL (ref 4.0–10.5)
nRBC: 0 % (ref 0.0–0.2)

## 2020-03-18 LAB — HCG, QUANTITATIVE, PREGNANCY: hCG, Beta Chain, Quant, S: <1 m[IU]/mL (ref <5)

## 2020-03-18 LAB — POC URINE PREG, ED: Preg Test, Ur: NEGATIVE

## 2020-03-18 LAB — LIPASE, BLOOD: Lipase: 28 U/L (ref 11–51)

## 2020-03-18 MED ORDER — ONDANSETRON 4 MG PO TBDP: ORAL_TABLET | ORAL | 0 refills | Status: DC

## 2020-03-18 MED ORDER — TAMSULOSIN HCL 0.4 MG PO CAPS
0.4000 mg | ORAL_CAPSULE | Freq: Once | ORAL | Status: AC
  Administered 2020-03-18: 0.4 mg via ORAL
  Filled 2020-03-18: qty 1

## 2020-03-18 MED ORDER — TAMSULOSIN HCL 0.4 MG PO CAPS: 0.4000 mg | ORAL_CAPSULE | Freq: Every day | ORAL | 0 refills | Status: DC

## 2020-03-18 MED ORDER — HYDROMORPHONE HCL 1 MG/ML IJ SOLN
1.0000 mg | Freq: Once | INTRAMUSCULAR | Status: AC
  Administered 2020-03-18: 1 mg via INTRAVENOUS
  Filled 2020-03-18: qty 1

## 2020-03-18 MED ORDER — SODIUM CHLORIDE 0.9 % IV BOLUS (SEPSIS)
500.0000 mL | Freq: Once | INTRAVENOUS | Status: AC
  Administered 2020-03-18: 500 mL via INTRAVENOUS

## 2020-03-18 MED ORDER — HYDROCODONE-ACETAMINOPHEN 5-325 MG PO TABS: 1.0000 | ORAL_TABLET | Freq: Four times a day (QID) | ORAL | 0 refills | Status: DC | PRN

## 2020-03-18 MED ORDER — KETOROLAC TROMETHAMINE 30 MG/ML IJ SOLN
30.0000 mg | Freq: Once | INTRAMUSCULAR | Status: AC
  Administered 2020-03-18: 30 mg via INTRAVENOUS
  Filled 2020-03-18: qty 1

## 2020-03-18 MED ORDER — OXYCODONE-ACETAMINOPHEN 5-325 MG PO TABS: 1.0000 | ORAL_TABLET | Freq: Four times a day (QID) | ORAL | 0 refills | Status: DC | PRN

## 2020-03-18 MED ORDER — ONDANSETRON HCL 4 MG/2ML IJ SOLN
4.0000 mg | Freq: Once | INTRAMUSCULAR | Status: AC
  Administered 2020-03-18: 4 mg via INTRAVENOUS
  Filled 2020-03-18: qty 2

## 2020-03-18 NOTE — ED Notes (Signed)
Patient transported to CT via stretcher accompanied by radiology staff. 

## 2020-03-18 NOTE — ED Provider Notes (Signed)
[facility] EMERGENCY DEPARTMENT Provider Note   CSN: [id number] Arrival date & time: 03/18/20  1706     History Chief Complaint  Patient presents with  . Abdominal Pain    Jacqueline Koch is a 25 y.o. female.  Patient complains of severe right groin pain which started today  The history is provided by the patient and medical records. No language interpreter was used.  Abdominal Pain Pain location:  RLQ Pain quality: aching   Pain radiates to:  Does not radiate Pain severity:  Moderate Onset quality:  Sudden Timing:  Constant Progression:  Worsening Chronicity:  New Context: not alcohol use   Relieved by:  Nothing Worsened by:  Nothing Ineffective treatments:  None tried Associated symptoms: no chest pain, no cough, no diarrhea, no fatigue and no hematuria        Past Medical History:  Diagnosis Date  . Nexplanon removal 06/09/2014  . Syncope     Patient Active Problem List   Diagnosis Date Noted  . Nexplanon insertion 08/22/2015  . Susceptible to varicella (non-immune), currently pregnant 12/13/2014  . Marijuana use 12/08/2014  . Chlamydia infection 01/20/2013  . Adolescent scoliosis 12/01/2012  . Abnormal vision screen 12/01/2012  . Hearing deficit 12/01/2012  . Scoliosis 11/02/2010  . Back pain 11/02/2010    History reviewed. No pertinent surgical history.   OB History    Gravida  1   Para  1   Term  1   Preterm  0   AB  0   Living  1     SAB  0   IAB  0   Ectopic  0   Multiple  0   Live Births  1           Family History  Problem Relation Age of Onset  . Asthma Brother   . Arthritis Paternal Grandmother   . Heart Problems Paternal Grandmother   . Crohn's disease Maternal Grandmother     Social History   Tobacco Use  . Smoking status: Light Tobacco Smoker    Years: 5.00    Types: Cigarettes  . Smokeless tobacco: Never Used  . Tobacco comment: about 2 cigs per day  Vaping Use  . Vaping Use: Never used  Substance  Use Topics  . Alcohol use: Yes    Alcohol/week: 0.0 standard drinks    Comment: social  . Drug use: No    Home Medications Prior to Admission medications   Medication Sig Start Date End Date Taking? Authorizing Provider  acetaminophen (TYLENOL) 500 MG tablet Take 1,000 mg by mouth every 6 (six) hours as needed.   Yes [provider]  etonogestrel (NEXPLANON) 68 MG IMPL implant 1 each by Subdermal route once.   Yes [provider]  HYDROcodone-acetaminophen (NORCO/VICODIN) 5-325 MG tablet Take 1 tablet by mouth every 6 (six) hours as needed. 03/18/20  Yes Bethann Berkshire, MD  ondansetron (ZOFRAN ODT) 4 MG disintegrating tablet 4mg  ODT q4 hours prn nausea/vomit 03/18/20  Yes 05/16/20, MD  oxyCODONE-acetaminophen (PERCOCET) 5-325 MG tablet Take 1 tablet by mouth every 6 (six) hours as needed. 03/18/20  Yes 05/16/20, MD  tamsulosin (FLOMAX) 0.4 MG CAPS capsule Take 1 capsule (0.4 mg total) by mouth daily. 03/18/20  Yes 05/16/20, MD    Allergies    Penicillins  Review of Systems   Review of Systems  Constitutional: Negative for appetite change and fatigue.  HENT: Negative for congestion, ear discharge and sinus pressure.  Eyes: Negative for discharge.  Respiratory: Negative for cough.   Cardiovascular: Negative for chest pain.  Gastrointestinal: Positive for abdominal pain. Negative for diarrhea.  Genitourinary: Negative for frequency and hematuria.  Musculoskeletal: Negative for back pain.  Skin: Negative for rash.  Neurological: Negative for seizures and headaches.  Psychiatric/Behavioral: Negative for hallucinations.    Physical Exam Updated Vital Signs BP 108/68   Pulse 75   Temp 97.9 F (36.6 C)   Resp 14   Ht 4\' 10"  (1.473 m)   Wt 59 kg   SpO2 100%   BMI 27.17 kg/m   Physical Exam Vitals and nursing note reviewed.  Constitutional:      Appearance: She is well-developed.  HENT:     Head: Normocephalic.     Nose: Nose normal.   Eyes:     General: No scleral icterus.    Extraocular Movements: EOM normal.     Conjunctiva/sclera: Conjunctivae normal.  Neck:     Thyroid: No thyromegaly.  Cardiovascular:     Rate and Rhythm: Normal rate and regular rhythm.     Heart sounds: No murmur heard. No friction rub. No gallop.   Pulmonary:     Breath sounds: No stridor. No wheezing or rales.  Chest:     Chest wall: No tenderness.  Abdominal:     General: There is no distension.     Tenderness: There is abdominal tenderness. There is no rebound.  Musculoskeletal:        General: No edema. Normal range of motion.     Cervical back: Neck supple.  Lymphadenopathy:     Cervical: No cervical adenopathy.  Skin:    Findings: No erythema or rash.  Neurological:     Mental Status: She is alert and oriented to person, place, and time.     Motor: No abnormal muscle tone.     Coordination: Coordination normal.  Psychiatric:        Mood and Affect: Mood and affect normal.        Behavior: Behavior normal.     ED Results / Procedures / Treatments   Labs (all labs ordered are listed, but only abnormal results are displayed) Labs Reviewed  COMPREHENSIVE METABOLIC PANEL - Abnormal; Notable for the following components:      Result Value   Glucose, Bld 124 (*)    All other components within normal limits  CBC - Abnormal; Notable for the following components:   RBC 3.74 (*)    All other components within normal limits  URINALYSIS, ROUTINE W REFLEX MICROSCOPIC - Abnormal; Notable for the following components:   APPearance CLOUDY (*)    Specific Gravity, Urine 1.031 (*)    Hgb urine dipstick MODERATE (*)    Ketones, ur 80 (*)    Protein, ur 100 (*)    Leukocytes,Ua TRACE (*)    Bacteria, UA RARE (*)    All other components within normal limits  LIPASE, BLOOD  HCG, QUANTITATIVE, PREGNANCY  POC URINE PREG, ED    EKG None  Radiology CT Renal Stone Study  Result Date: 03/18/2020 CLINICAL DATA:  Right lower  quadrant pain for 2 hours. Nausea without vomiting. Denies hematuria. EXAM: CT ABDOMEN AND PELVIS WITHOUT CONTRAST TECHNIQUE: Multidetector CT imaging of the abdomen and pelvis was performed following the standard protocol without IV contrast. COMPARISON:  None. FINDINGS: Lower chest: Clear lung bases.  Heart normal in size. Hepatobiliary: No focal liver abnormality is seen. No gallstones, gallbladder wall thickening, or biliary dilatation.  Pancreas: Unremarkable. No pancreatic ductal dilatation or surrounding inflammatory changes. Spleen: Normal in size without focal abnormality. Adrenals/Urinary Tract: No adrenal masses. Right kidney is mildly larger and lower in attenuation than the left consistent with edema. There is mild right perinephric stranding/haziness that extends along the anterior pararenal fascia and adjacent to the proximal right ureter. Mild right hydronephrosis. Dilation portions of the right ureter. Right ureter is difficult to follow in the pelvis. There is a 3 mm stone adjacent to the right posterior bladder. This appears to be in the distal ureter. The bladder is decompressed, not well assessed. No other evidence of a ureteral stone. No convincing right intrarenal stone. Possible small nonobstructing stones in the mid to upper pole of the left kidney. No renal masses. No left hydronephrosis. Normal left ureter. Stomach/Bowel: Stomach is within normal limits. Appendix appears normal. No evidence of bowel wall thickening, distention, or inflammatory changes. Vascular/Lymphatic: No significant vascular findings are present. No enlarged abdominal or pelvic lymph nodes. Reproductive: Uterus not well-defined from the adjacent unenhanced small bowel. No convincing uterine mass or abnormality. No adnexal masses. Other: Trace amount of pelvic free fluid. Musculoskeletal: Mild compression deformity of T11, which appears chronic. No other fractures. No bone lesions. IMPRESSION: 1. Findings consistent with  an obstructing distal right ureteral stone. There is a 3 mm stone that projects in the expected location of the distal right ureter, ureter not well-defined on this exam. There is associated right renal edema and mild right perinephric and periureteral stranding. 2. No other evidence of an acute abnormality within the abdomen or pelvis. 3. Two small stones are suspected in the mid to upper pole of left kidney. Electronically Signed   By: Amie Portland M.D.   On: 03/18/2020 21:38    Procedures Procedures   Medications Ordered in ED Medications  ketorolac (TORADOL) 30 MG/ML injection 30 mg (has no administration in time range)  tamsulosin (FLOMAX) capsule 0.4 mg (has no administration in time range)  HYDROmorphone (DILAUDID) injection 1 mg (1 mg Intravenous Given 03/18/20 1844)  ondansetron (ZOFRAN) injection 4 mg (4 mg Intravenous Given 03/18/20 1843)  sodium chloride 0.9 % bolus 500 mL (0 mLs Intravenous Stopped 03/18/20 1928)  HYDROmorphone (DILAUDID) injection 1 mg (1 mg Intravenous Given 03/18/20 1953)  ondansetron (ZOFRAN) injection 4 mg (4 mg Intravenous Given 03/18/20 1952)    ED Course  I have reviewed the triage vital signs and the nursing notes.  Pertinent labs & imaging results that were available during my care of the patient were reviewed by me and considered in my medical decision making (see chart for details).    MDM Rules/Calculators/A&P                         Patient with a 3 mm right ureteral stone is in the distal ureter.  She will be sent home with Flomax Zofran hydrocodone and will follow up with urology Final Clinical Impression(s) / ED Diagnoses Final diagnoses:  Kidney stone    Rx / DC Orders ED Discharge Orders         Ordered    ondansetron (ZOFRAN ODT) 4 MG disintegrating tablet        03/18/20 2218    tamsulosin (FLOMAX) 0.4 MG CAPS capsule  Daily        03/18/20 2218    HYDROcodone-acetaminophen (NORCO/VICODIN) 5-325 MG tablet  Every 6 hours PRN         03/18/20 2218  oxyCODONE-acetaminophen (PERCOCET) 5-325 MG tablet  Every 6 hours PRN        03/18/20 2219           Bethann Berkshire, MD 03/20/20 [phone]

## 2020-03-18 NOTE — Discharge Instructions (Signed)
Follow-up with [facility] next week.  If you have problems this weekend then go to [facility] emergency department in [place]

## 2020-03-18 NOTE — ED Triage Notes (Signed)
Right lower quadrant pain for 2 hours

## 2020-03-21 MED FILL — Oxycodone w/ Acetaminophen Tab 5-325 MG: ORAL | Qty: 6 | Status: AC

## 2020-03-24 ENCOUNTER — Encounter: Payer: Self-pay | Admitting: Urology

## 2020-03-24 ENCOUNTER — Other Ambulatory Visit: Payer: Self-pay

## 2020-03-24 ENCOUNTER — Ambulatory Visit (INDEPENDENT_AMBULATORY_CARE_PROVIDER_SITE_OTHER): Payer: Medicaid Other | Admitting: Urology

## 2020-03-24 VITALS — BP 99/68 | HR 76 | Temp 97.7°F | Ht <= 58 in | Wt 129.0 lb

## 2020-03-24 DIAGNOSIS — N201 Calculus of ureter: Secondary | ICD-10-CM | POA: Diagnosis not present

## 2020-03-24 DIAGNOSIS — N200 Calculus of kidney: Secondary | ICD-10-CM

## 2020-03-24 DIAGNOSIS — R3129 Other microscopic hematuria: Secondary | ICD-10-CM | POA: Diagnosis not present

## 2020-03-24 LAB — URINALYSIS, ROUTINE W REFLEX MICROSCOPIC
Bilirubin, UA: NEGATIVE
Glucose, UA: NEGATIVE
Ketones, UA: NEGATIVE
Nitrite, UA: NEGATIVE
Specific Gravity, UA: 1.015 (ref 1.005–1.030)
Urobilinogen, Ur: 0.2 mg/dL (ref 0.2–1.0)
pH, UA: 6 (ref 5.0–7.5)

## 2020-03-24 LAB — MICROSCOPIC EXAMINATION
Epithelial Cells (non renal): >10 /hpf — AB (ref 0–10)
Renal Epithel, UA: NONE SEEN /hpf

## 2020-03-24 MED ORDER — TAMSULOSIN HCL 0.4 MG PO CAPS: 0.4000 mg | ORAL_CAPSULE | Freq: Every day | ORAL | 0 refills | Status: DC

## 2020-03-24 NOTE — Progress Notes (Signed)
Urological Symptom Review  Patient is experiencing the following symptoms: Injury to kidneys/bladder   Review of Systems  Gastrointestinal (upper)  : Negative for upper GI symptoms  Gastrointestinal (lower) : Negative for lower GI symptoms  Constitutional : Negative for symptoms  Skin: Negative for skin symptoms  Eyes: Negative for eye symptoms  Ear/Nose/Throat : Negative for Ear/Nose/Throat symptoms  Hematologic/Lymphatic: Negative for Hematologic/Lymphatic symptoms  Cardiovascular : Negative for cardiovascular symptoms  Respiratory : Negative for respiratory symptoms  Endocrine: Negative for endocrine symptoms  Musculoskeletal: Negative for musculoskeletal symptoms  Neurological: Negative for neurological symptoms  Psychologic: Negative for psychiatric symptoms  

## 2020-03-24 NOTE — Progress Notes (Signed)
Subjective: 1. Ureteral calculus, right   2. Microhematuria      Jacqueline Koch is a 25 yo female who was seen in the ER last Friday with severe right flank pain with nausea and vomiting.   She had no hematuria. She had no frequency or urgency.  She last had pain last night.   She has not seen a stone pass.  She was given tamsulosin and meds for pain and nausea.  She had no fever.  She has had no prior stones.  She has a 70mm stone in the area of the right UVJ with mild hydro and stranding.   There a couple of are punctate renal stones.   UA has 11-30 RBC's, 6-10 WBC, >10 Epis and mod bacteria.   Her CMP and CBC are unremarkable.  ROS:  ROS  Allergies  Allergen Reactions  . Penicillins     Pt states she is not sure, she has been allergic since she was younger     Past Medical History:  Diagnosis Date  . Kidney stone   . Nexplanon removal 06/09/2014  . Syncope     History reviewed. No pertinent surgical history.  Social History   Socioeconomic History  . Marital status: Single    Spouse name: Not on file  . Number of children: Not on file  . Years of education: in 10th  . Highest education level: Not on file  Occupational History  . Occupation: Consulting civil engineer  Tobacco Use  . Smoking status: Light Tobacco Smoker    Years: 5.00    Types: Cigarettes  . Smokeless tobacco: Never Used  . Tobacco comment: about 2 cigs per day  Vaping Use  . Vaping Use: Never used  Substance and Sexual Activity  . Alcohol use: Yes    Alcohol/week: 0.0 standard drinks    Comment: social  . Drug use: No  . Sexual activity: Not Currently    Birth control/protection: Implant  Other Topics Concern  . Not on file  Social History Narrative  . Not on file   Social Determinants of Health   Financial Resource Strain: Not on file  Food Insecurity: Not on file  Transportation Needs: Not on file  Physical Activity: Not on file  Stress: Not on file  Social Connections: Not on file  Intimate Partner Violence:  Not on file    Family History  Problem Relation Age of Onset  . Asthma Brother   . Arthritis Paternal Grandmother   . Heart Problems Paternal Grandmother   . Crohn's disease Maternal Grandmother     Anti-infectives: Anti-infectives (From admission, onward)   None      Current Outpatient Medications  Medication Sig Dispense Refill  . acetaminophen (TYLENOL) 500 MG tablet Take 1,000 mg by mouth every 6 (six) hours as needed.    . etonogestrel (NEXPLANON) 68 MG IMPL implant 1 each by Subdermal route once.    Marland Kitchen HYDROcodone-acetaminophen (NORCO/VICODIN) 5-325 MG tablet Take 1 tablet by mouth every 6 (six) hours as needed. 20 tablet 0  . ondansetron (ZOFRAN ODT) 4 MG disintegrating tablet 4mg  ODT q4 hours prn nausea/vomit 12 tablet 0  . oxyCODONE-acetaminophen (PERCOCET) 5-325 MG tablet Take 1 tablet by mouth every 6 (six) hours as needed. 6 tablet 0  . tamsulosin (FLOMAX) 0.4 MG CAPS capsule Take 1 capsule (0.4 mg total) by mouth daily. 30 capsule 0   No current facility-administered medications for this visit.     Objective: Vital signs in last 24 hours: BP 99/68  Pulse 76   Temp 97.7 F (36.5 C)   Ht  (1.473 m)   Wt 129 lb (58.5 kg)   BMI 26.96 kg/m   Intake/Output from previous day: No intake/output data recorded. Intake/Output this shift: @   Physical Exam Vitals reviewed.  Constitutional:      Appearance: Normal appearance.  Abdominal:     General: Abdomen is flat.     Palpations: Abdomen is soft.     Tenderness: There is no abdominal tenderness. There is no right CVA tenderness or left CVA tenderness.  Neurological:     Mental Status: She is alert.     Lab Results:  Recent Results (from the past 2160 hour(s))  Novel Coronavirus, NAA ([facility])     Status: None   Collection Time: 01/12/20 12:00 AM   Specimen: Nasopharyngeal(NP) swabs in vial transport medium   Nasopharynge  Screenin  Result Value Ref Range   SARS-CoV-2, NAA Not  Detected Not Detected    Comment: This nucleic acid amplification test was developed and its performance characteristics determined by [employer]. Nucleic acid amplification tests include RT-PCR and TMA. This test has not been FDA cleared or approved. This test has been authorized by FDA under an Emergency Use Authorization (EUA). This test is only authorized for the duration of time the declaration that circumstances exist justifying the authorization of the emergency use of in vitro diagnostic tests for detection of SARS-CoV-2 virus and/or diagnosis of COVID-19 infection under section 564(b)(1) of the Act, 21 U.S.C. 161WRU-0(A) (1), unless the authorization is terminated or revoked sooner. When diagnostic testing is negative, the possibility of a false negative result should be considered in the context of a patient's recent exposures and the presence of clinical signs and symptoms consistent with COVID-19. An individual without symptoms of COVID-19 and who is not shedding SARS-CoV-2 virus wo uld expect to have a negative (not detected) result in this assay.   SARS-COV-2, NAA 2 DAY TAT     Status: None   Collection Time: 01/12/20 12:00 AM   Nasopharynge  Screenin  Result Value Ref Range   SARS-CoV-2, NAA 2 DAY TAT Performed   Specimen status report     Status: None   Collection Time: 01/12/20 12:00 AM  Result Value Ref Range   specimen status report Comment     Comment: Please note Please note The date and/or time of collection was not indicated on the requisition as required by state and federal law.  The date of receipt of the specimen was used as the collection date if not supplied.   I-Stat beta hCG blood, ED     Status: None   Collection Time: 02/23/20  4:52 PM  Result Value Ref Range   I-stat hCG, quantitative <5.0 <5 mIU/mL   Comment 3            Comment:   GEST. AGE      CONC.  (mIU/mL)   <=1 WEEK        5 - 50     2 WEEKS       50 - 500     3 WEEKS        100 - 10,000     4 WEEKS     1,000 - 30,000        FEMALE AND NON-PREGNANT FEMALE:     LESS THAN 5 mIU/mL   I-stat chem 8, ED (not at [facility] or [facility])     Status: Abnormal  Collection Time: 02/23/20  5:05 PM  Result Value Ref Range   Sodium 142 135 - 145 mmol/L   Potassium 3.7 3.5 - 5.1 mmol/L   Chloride 106 98 - 111 mmol/L   BUN 4 (L) 6 - 20 mg/dL   Creatinine, Ser 3.55 0.44 - 1.00 mg/dL   Glucose, Bld 98 70 - 99 mg/dL    Comment: Glucose reference range applies only to samples taken after fasting for at least 8 hours.   Calcium, Ion 1.25 1.15 - 1.40 mmol/L   TCO2 26 22 - 32 mmol/L   Hemoglobin 12.2 12.0 - 15.0 g/dL   HCT 73.2 20.2 - 54.2 %  Urinalysis, Routine w reflex microscopic Urine, Clean Catch     Status: Abnormal   Collection Time: 03/18/20  5:54 PM  Result Value Ref Range   Color, Urine YELLOW YELLOW   APPearance CLOUDY (A) CLEAR   Specific Gravity, Urine 1.031 (H) 1.005 - 1.030   pH 5.0 5.0 - 8.0   Glucose, UA NEGATIVE NEGATIVE mg/dL   Hgb urine dipstick MODERATE (A) NEGATIVE   Bilirubin Urine NEGATIVE NEGATIVE   Ketones, ur 80 (A) NEGATIVE mg/dL   Protein, ur 706 (A) NEGATIVE mg/dL   Nitrite NEGATIVE NEGATIVE   Leukocytes,Ua TRACE (A) NEGATIVE   RBC / HPF 21-50 0 - 5 RBC/hpf   WBC, UA 6-10 0 - 5 WBC/hpf   Bacteria, UA RARE (A) NONE SEEN   Squamous Epithelial / LPF 6-10 0 - 5   Mucus PRESENT     Comment: Performed at [facility], [address] [place], [place] [address]  Lipase, blood     Status: None   Collection Time: 03/18/20  6:11 PM  Result Value Ref Range   Lipase 28 11 - 51 U/L    Comment: Performed at [facility], [address] [place], [place] [address]  Comprehensive metabolic panel     Status: Abnormal   Collection Time: 03/18/20  6:11 PM  Result Value Ref Range   Sodium 137 135 - 145 mmol/L   Potassium 3.5 3.5 - 5.1 mmol/L   Chloride 105 98 - 111 mmol/L   CO2 24 22 - 32 mmol/L   Glucose, Bld 124 (H) 70 - 99 mg/dL    Comment: Glucose  reference range applies only to samples taken after fasting for at least 8 hours.   BUN 13 6 - 20 mg/dL   Creatinine, Ser 7.61 0.44 - 1.00 mg/dL   Calcium 9.3 8.9 - 60.7 mg/dL   Total Protein 7.8 6.5 - 8.1 g/dL   Albumin 4.5 3.5 - 5.0 g/dL   AST 15 15 - 41 U/L   ALT 10 0 - 44 U/L   Alkaline Phosphatase 74 38 - 126 U/L   Total Bilirubin 0.4 0.3 - 1.2 mg/dL   GFR, Estimated >37 >10 mL/min    Comment: (NOTE) Calculated using the CKD-EPI Creatinine Equation (2021)    Anion gap 8 5 - 15    Comment: Performed at [facility], [address], [place], [place] [address]  CBC     Status: Abnormal   Collection Time: 03/18/20  6:11 PM  Result Value Ref Range   WBC 8.5 4.0 - 10.5 K/uL   RBC 3.74 (L) 3.87 - 5.11 MIL/uL   Hemoglobin 12.0 12.0 - 15.0 g/dL   HCT 85.4 62.7 - 03.5 %   MCV 96.3 80.0 - 100.0 fL   MCH 32.1 26.0 - 34.0 pg   MCHC 33.3 30.0 -  36.0 g/dL   RDW 79.3 90.3 - 00.9 %   Platelets 170 150 - 400 K/uL   nRBC 0.0 0.0 - 0.2 %    Comment: Performed at [facility], [address], [place], [place] [address]  hCG, quantitative, pregnancy     Status: None   Collection Time: 03/18/20  6:23 PM  Result Value Ref Range   hCG, Beta Chain, Quant, S <1 <5 mIU/mL    Comment:          GEST. AGE      CONC.  (mIU/mL)   <=1 WEEK        5 - 50     2 WEEKS       50 - 500     3 WEEKS       100 - 10,000     4 WEEKS     1,000 - 30,000     5 WEEKS     3,500 - 115,000   6-8 WEEKS     12,000 - 270,000    12 WEEKS     15,000 - 220,000        FEMALE AND NON-PREGNANT FEMALE:     LESS THAN 5 mIU/mL Performed at [facility], [address], [place], [place] [address]   POC urine preg, ED     Status: None   Collection Time: 03/18/20  8:56 PM  Result Value Ref Range   Preg Test, Ur NEGATIVE NEGATIVE    Comment:        THE SENSITIVITY OF THIS METHODOLOGY IS >24 mIU/mL   Urinalysis, Routine w reflex microscopic     Status: Abnormal   Collection Time: 03/24/20  9:35 AM  Result Value Ref Range    Specific Gravity, UA 1.015 1.005 - 1.030   pH, UA 6.0 5.0 - 7.5   Color, UA Yellow Yellow   Appearance Ur Clear Clear   Leukocytes,UA Trace (A) Negative   Protein,UA 1+ (A) Negative/Trace   Glucose, UA Negative Negative   Ketones, UA Negative Negative   RBC, UA 1+ (A) Negative   Bilirubin, UA Negative Negative   Urobilinogen, Ur 0.2 0.2 - 1.0 mg/dL   Nitrite, UA Negative Negative   Microscopic Examination See below:   Microscopic Examination     Status: Abnormal   Collection Time: 03/24/20  9:35 AM   Urine  Result Value Ref Range   WBC, UA 6-10 (A) 0 - 5 /hpf   RBC 11-30 (A) 0 - 2 /hpf   Epithelial Cells (non renal) >10 (A) 0 - 10 /hpf   Renal Epithel, UA None seen None seen /hpf   Mucus, UA Present Not Estab.   Bacteria, UA Many (A) None seen/Few    Studies/Results: CT films and report reviewed.  ER records reviewed.   Assessment/Plan: 63mm Right distal stone.   I will refill the tamsulosin and get a KUB today.  She will return in 3 weeks and may need a KUB if the stone is visible on the KUB today.  Microhematuria and pyuria.  She has no UTI symptoms but I will get a culture based on the UA.   Meds ordered this encounter  Medications  . tamsulosin (FLOMAX) 0.4 MG CAPS capsule    Sig: Take 1 capsule (0.4 mg total) by mouth daily.    Dispense:  30 capsule    Refill:  0     Orders Placed This Encounter  Procedures  . Urine Culture  . Microscopic Examination  .  DG Abd 1 View    Order Specific Question:   Reason for Exam (SYMPTOM  OR DIAGNOSIS REQUIRED)    Answer:   ureteral stone    Order Specific Question:   Preferred imaging location?    Answer:   [facility]    Order Specific Question:   Radiology Contrast Protocol - do NOT remove file path    Answer:   [email or web address]    Order Specific Question:   Is patient pregnant?    Answer:   No  . Urinalysis, Routine w reflex microscopic     Return in  about 3 weeks (around 04/14/2020) for I may order a repeat KUB depending on today's result. Bjorn Pippin.        Sehaj Kolden 03/24/2020 [phone]

## 2020-03-26 LAB — URINE CULTURE

## 2020-03-28 NOTE — Progress Notes (Signed)
Results sent via my chart 

## 2020-03-30 ENCOUNTER — Ambulatory Visit: Payer: Medicaid Other | Admitting: Orthopedic Surgery

## 2020-03-30 DIAGNOSIS — S42115D Nondisplaced fracture of body of scapula, left shoulder, subsequent encounter for fracture with routine healing: Secondary | ICD-10-CM

## 2020-03-30 DIAGNOSIS — S42025A Nondisplaced fracture of shaft of left clavicle, initial encounter for closed fracture: Secondary | ICD-10-CM

## 2020-03-30 HISTORY — DX: Nondisplaced fracture of shaft of left clavicle, initial encounter for closed fracture: S42.025A

## 2020-03-30 HISTORY — DX: Nondisplaced fracture of body of scapula, left shoulder, subsequent encounter for fracture with routine healing: S42.115D

## 2020-03-31 ENCOUNTER — Ambulatory Visit (INDEPENDENT_AMBULATORY_CARE_PROVIDER_SITE_OTHER): Payer: Medicaid Other | Admitting: Orthopedic Surgery

## 2020-03-31 ENCOUNTER — Other Ambulatory Visit: Payer: Self-pay

## 2020-03-31 ENCOUNTER — Ambulatory Visit: Payer: Medicaid Other

## 2020-03-31 ENCOUNTER — Encounter: Payer: Self-pay | Admitting: Orthopedic Surgery

## 2020-03-31 VITALS — BP 98/63 | HR 81 | Ht <= 58 in

## 2020-03-31 DIAGNOSIS — S42025D Nondisplaced fracture of shaft of left clavicle, subsequent encounter for fracture with routine healing: Secondary | ICD-10-CM

## 2020-03-31 DIAGNOSIS — S42115D Nondisplaced fracture of body of scapula, left shoulder, subsequent encounter for fracture with routine healing: Secondary | ICD-10-CM

## 2020-03-31 NOTE — Patient Instructions (Signed)
Home exercises  

## 2020-03-31 NOTE — Progress Notes (Signed)
Chief Complaint  Patient presents with  . Shoulder Injury    Left shoulder, Patient reports that she is doing better,    Clavicle fracture January 25 first visit doing well mild tenderness at the fracture site no deformity x-rays show fracture is healing well she has limited range of motion I like to start her on some home exercises.  I gave her some [place] exercises I will see her in 4 weeks for x-ray

## 2020-04-13 NOTE — Progress Notes (Signed)
Subjective: 1. Ureteral calculus, right      04/14/20: Jacqueline Koch returns today in f/u for her history of a 37mm right UVJ stone.  She hasn't had pain since 03/23/20.  She has no voiding complaints.   Her UA is clear.    03/24/20: Jacqueline Koch is a 25 yo female who was seen in the ER last Friday with severe right flank pain with nausea and vomiting.   She had no hematuria. She had no frequency or urgency.  She last had pain last night.   She has not seen a stone pass.  She was given tamsulosin and meds for pain and nausea.  She had no fever.  She has had no prior stones.  She has a 80mm stone in the area of the right UVJ with mild hydro and stranding.   There a couple of are punctate renal stones.   UA has 11-30 RBC's, 6-10 WBC, >10 Epis and mod bacteria.   Her CMP and CBC are unremarkable.  ROS:  ROS  Allergies  Allergen Reactions  . Penicillins Other (See Comments)    Pt states she is not sure, she has been allergic since she was younger     Past Medical History:  Diagnosis Date  . Kidney stone   . Nexplanon removal 06/09/2014  . Syncope     No past surgical history on file.  Social History   Socioeconomic History  . Marital status: Single    Spouse name: Not on file  . Number of children: Not on file  . Years of education: in 10th  . Highest education level: Not on file  Occupational History  . Occupation: Consulting civil engineer  Tobacco Use  . Smoking status: Light Tobacco Smoker    Years: 5.00    Types: Cigarettes  . Smokeless tobacco: Never Used  . Tobacco comment: about 2 cigs per day  Vaping Use  . Vaping Use: Never used  Substance and Sexual Activity  . Alcohol use: Yes    Alcohol/week: 0.0 standard drinks    Comment: social  . Drug use: No  . Sexual activity: Not Currently    Birth control/protection: Implant  Other Topics Concern  . Not on file  Social History Narrative  . Not on file   Social Determinants of Health   Financial Resource Strain: Not on file  Food Insecurity:  Not on file  Transportation Needs: Not on file  Physical Activity: Not on file  Stress: Not on file  Social Connections: Not on file  Intimate Partner Violence: Not on file    Family History  Problem Relation Age of Onset  . Asthma Brother   . Arthritis Paternal Grandmother   . Heart Problems Paternal Grandmother   . Crohn's disease Maternal Grandmother     Anti-infectives: Anti-infectives (From admission, onward)   None      Current Outpatient Medications  Medication Sig Dispense Refill  . acetaminophen (TYLENOL) 500 MG tablet Take 1,000 mg by mouth every 6 (six) hours as needed.    . etonogestrel (NEXPLANON) 68 MG IMPL implant 1 each by Subdermal route once.     No current facility-administered medications for this visit.     Objective: Vital signs in last 24 hours: There were no vitals taken for this visit.  Intake/Output from previous day: No intake/output data recorded. Intake/Output this shift: @IOTHISSHIFT @   Physical Exam  Lab Results:  Recent Results (from the past 2160 hour(s))  I-Stat beta hCG blood, ED  Status: None   Collection Time: 02/23/20  4:52 PM  Result Value Ref Range   I-stat hCG, quantitative <5.0 <5 mIU/mL   Comment 3            Comment:   GEST. AGE      CONC.  (mIU/mL)   <=1 WEEK        5 - 50     2 WEEKS       50 - 500     3 WEEKS       100 - 10,000     4 WEEKS     1,000 - 30,000        FEMALE AND NON-PREGNANT FEMALE:     LESS THAN 5 mIU/mL   I-stat chem 8, ED (not at [facility] or [facility])     Status: Abnormal   Collection Time: 02/23/20  5:05 PM  Result Value Ref Range   Sodium 142 135 - 145 mmol/L   Potassium 3.7 3.5 - 5.1 mmol/L   Chloride 106 98 - 111 mmol/L   BUN 4 (L) 6 - 20 mg/dL   Creatinine, Ser 0.96 0.44 - 1.00 mg/dL   Glucose, Bld 98 70 - 99 mg/dL    Comment: Glucose reference range applies only to samples taken after fasting for at least 8 hours.   Calcium, Ion 1.25 1.15 - 1.40 mmol/L   TCO2 26 22 - 32 mmol/L    Hemoglobin 12.2 12.0 - 15.0 g/dL   HCT 28.3 66.2 - 94.7 %  Urinalysis, Routine w reflex microscopic Urine, Clean Catch     Status: Abnormal   Collection Time: 03/18/20  5:54 PM  Result Value Ref Range   Color, Urine YELLOW YELLOW   APPearance CLOUDY (A) CLEAR   Specific Gravity, Urine 1.031 (H) 1.005 - 1.030   pH 5.0 5.0 - 8.0   Glucose, UA NEGATIVE NEGATIVE mg/dL   Hgb urine dipstick MODERATE (A) NEGATIVE   Bilirubin Urine NEGATIVE NEGATIVE   Ketones, ur 80 (A) NEGATIVE mg/dL   Protein, ur 654 (A) NEGATIVE mg/dL   Nitrite NEGATIVE NEGATIVE   Leukocytes,Ua TRACE (A) NEGATIVE   RBC / HPF 21-50 0 - 5 RBC/hpf   WBC, UA 6-10 0 - 5 WBC/hpf   Bacteria, UA RARE (A) NONE SEEN   Squamous Epithelial / LPF 6-10 0 - 5   Mucus PRESENT     Comment: Performed at [facility], [address] [place], [place] [address]  Lipase, blood     Status: None   Collection Time: 03/18/20  6:11 PM  Result Value Ref Range   Lipase 28 11 - 51 U/L    Comment: Performed at [facility], [address] [place], [place] [address]  Comprehensive metabolic panel     Status: Abnormal   Collection Time: 03/18/20  6:11 PM  Result Value Ref Range   Sodium 137 135 - 145 mmol/L   Potassium 3.5 3.5 - 5.1 mmol/L   Chloride 105 98 - 111 mmol/L   CO2 24 22 - 32 mmol/L   Glucose, Bld 124 (H) 70 - 99 mg/dL    Comment: Glucose reference range applies only to samples taken after fasting for at least 8 hours.   BUN 13 6 - 20 mg/dL   Creatinine, Ser 1.27 0.44 - 1.00 mg/dL   Calcium 9.3 8.9 - 51.7 mg/dL   Total Protein 7.8 6.5 - 8.1 g/dL   Albumin 4.5 3.5 - 5.0 g/dL   AST 15 15 -  41 U/L   ALT 10 0 - 44 U/L   Alkaline Phosphatase 74 38 - 126 U/L   Total Bilirubin 0.4 0.3 - 1.2 mg/dL   GFR, Estimated >72>60 >53>60 mL/min    Comment: (NOTE) Calculated using the CKD-EPI Creatinine Equation (2021)    Anion gap 8 5 - 15    Comment: Performed at [facility], [address] [place], [place] [address]  CBC     Status: Abnormal    Collection Time: 03/18/20  6:11 PM  Result Value Ref Range   WBC 8.5 4.0 - 10.5 K/uL   RBC 3.74 (L) 3.87 - 5.11 MIL/uL   Hemoglobin 12.0 12.0 - 15.0 g/dL   HCT 34.736.0 42.536.0 - 95.646.0 %   MCV 96.3 80.0 - 100.0 fL   MCH 32.1 26.0 - 34.0 pg   MCHC 33.3 30.0 - 36.0 g/dL   RDW 38.712.2 56.411.5 - 33.215.5 %   Platelets 170 150 - 400 K/uL   nRBC 0.0 0.0 - 0.2 %    Comment: Performed at [facility], [address] [place], [place] [address]  hCG, quantitative, pregnancy     Status: None   Collection Time: 03/18/20  6:23 PM  Result Value Ref Range   hCG, Beta Chain, Quant, S <1 <5 mIU/mL    Comment:          GEST. AGE      CONC.  (mIU/mL)   <=1 WEEK        5 - 50     2 WEEKS       50 - 500     3 WEEKS       100 - 10,000     4 WEEKS     1,000 - 30,000     5 WEEKS     3,500 - 115,000   6-8 WEEKS     12,000 - 270,000    12 WEEKS     15,000 - 220,000        FEMALE AND NON-PREGNANT FEMALE:     LESS THAN 5 mIU/mL Performed at [facility], [address] [place], [place] [address]   POC urine preg, ED     Status: None   Collection Time: 03/18/20  8:56 PM  Result Value Ref Range   Preg Test, Ur NEGATIVE NEGATIVE    Comment:        THE SENSITIVITY OF THIS METHODOLOGY IS >24 mIU/mL   Urinalysis, Routine w reflex microscopic     Status: Abnormal   Collection Time: 03/24/20  9:35 AM  Result Value Ref Range   Specific Gravity, UA 1.015 1.005 - 1.030   pH, UA 6.0 5.0 - 7.5   Color, UA Yellow Yellow   Appearance Ur Clear Clear   Leukocytes,UA Trace (A) Negative   Protein,UA 1+ (A) Negative/Trace   Glucose, UA Negative Negative   Ketones, UA Negative Negative   RBC, UA 1+ (A) Negative   Bilirubin, UA Negative Negative   Urobilinogen, Ur 0.2 0.2 - 1.0 mg/dL   Nitrite, UA Negative Negative   Microscopic Examination See below:   Microscopic Examination     Status: Abnormal   Collection Time: 03/24/20  9:35 AM   Urine  Result Value Ref Range   WBC, UA 6-10 (A) 0 - 5 /hpf   RBC 11-30 (A) 0 - 2 /hpf    Epithelial Cells (non renal) >10 (A) 0 - 10 /hpf   Renal Epithel, UA None seen None seen /hpf  Mucus, UA Present Not Estab.   Bacteria, UA Many (A) None seen/Few  Urine Culture     Status: None   Collection Time: 03/24/20 10:37 AM   Specimen: Urine, Clean Catch   Urine  Result Value Ref Range   Urine Culture, Routine Final report    Organism ID, Bacteria Comment     Comment: Mixed urogenital flora 25,000-50,000 colony forming units per mL    UA is clear today.   Studies/Results:    Assessment/Plan: 16mm Right distal stone.    Based on her lack of symptoms and clear UA, I believe she has passed her stone.   I discussed stone prevention with diet and hydration and will have her return as needed.    No orders of the defined types were placed in this encounter.    Orders Placed This Encounter  Procedures  . Urinalysis, Routine w reflex microscopic     Return if symptoms worsen or fail to improve.        Bjorn Pippin 04/14/2020 [phone]

## 2020-04-14 ENCOUNTER — Ambulatory Visit (INDEPENDENT_AMBULATORY_CARE_PROVIDER_SITE_OTHER): Payer: Medicaid Other | Admitting: Urology

## 2020-04-14 ENCOUNTER — Other Ambulatory Visit: Payer: Self-pay

## 2020-04-14 ENCOUNTER — Encounter: Payer: Self-pay | Admitting: Urology

## 2020-04-14 DIAGNOSIS — N201 Calculus of ureter: Secondary | ICD-10-CM

## 2020-04-14 LAB — URINALYSIS, ROUTINE W REFLEX MICROSCOPIC
Bilirubin, UA: NEGATIVE
Glucose, UA: NEGATIVE
Ketones, UA: NEGATIVE
Leukocytes,UA: NEGATIVE
Nitrite, UA: NEGATIVE
Protein,UA: NEGATIVE
RBC, UA: NEGATIVE
Specific Gravity, UA: 1.015 (ref 1.005–1.030)
Urobilinogen, Ur: 0.2 mg/dL (ref 0.2–1.0)
pH, UA: 7 (ref 5.0–7.5)

## 2020-04-14 NOTE — Progress Notes (Signed)

## 2020-04-28 ENCOUNTER — Ambulatory Visit: Payer: Medicaid Other

## 2020-04-28 ENCOUNTER — Other Ambulatory Visit: Payer: Self-pay

## 2020-04-28 ENCOUNTER — Encounter: Payer: Self-pay | Admitting: Orthopedic Surgery

## 2020-04-28 ENCOUNTER — Ambulatory Visit (INDEPENDENT_AMBULATORY_CARE_PROVIDER_SITE_OTHER): Payer: Medicaid Other | Admitting: Orthopedic Surgery

## 2020-04-28 DIAGNOSIS — S42025D Nondisplaced fracture of shaft of left clavicle, subsequent encounter for fracture with routine healing: Secondary | ICD-10-CM

## 2020-04-28 NOTE — Progress Notes (Signed)
Fracture care follow-up  Left clavicle fracture  Encounter Diagnosis  Name Primary?  . Closed nondisplaced fracture of shaft of left clavicle with routine healing, subsequent encounter 03/01/20 Yes    Chief Complaint  Patient presents with  . Clavicle Injury    03/01/20 left clavicle fracture     Jacqueline Koch indicates that her pain has really stopped she has full range of motion  Her x-ray looks good  She is released to normal activity

## 2020-08-09 ENCOUNTER — Encounter (HOSPITAL_COMMUNITY): Payer: Self-pay

## 2020-08-09 ENCOUNTER — Other Ambulatory Visit: Payer: Self-pay

## 2020-08-09 ENCOUNTER — Emergency Department (HOSPITAL_COMMUNITY)
Admission: EM | Admit: 2020-08-09 | Discharge: 2020-08-09 | Disposition: A | Discharge status: Home / Self Care | Payer: Medicaid Other | Attending: Emergency Medicine | Admitting: Emergency Medicine

## 2020-08-09 DIAGNOSIS — F17210 Nicotine dependence, cigarettes, uncomplicated: Secondary | ICD-10-CM | POA: Diagnosis not present

## 2020-08-09 DIAGNOSIS — U071 COVID-19: Secondary | ICD-10-CM | POA: Insufficient documentation

## 2020-08-09 DIAGNOSIS — R519 Headache, unspecified: Secondary | ICD-10-CM | POA: Diagnosis present

## 2020-08-09 LAB — POC SARS CORONAVIRUS 2 AG -  ED: SARSCOV2ONAVIRUS 2 AG: POSITIVE — AB

## 2020-08-09 NOTE — ED Provider Notes (Signed)
[facility] EMERGENCY DEPARTMENT Provider Note   CSN: [id number] Arrival date & time: 08/09/20  5465     History Chief Complaint  Patient presents with   Headache    Jacqueline Koch is a 25 y.o. female.  HPI    25 year old female comes in with chief complaint of headaches, runny nose.  She reports that her daughter got sick prior to her.  She has been having symptoms for 3 to 4 days.  Symptoms included fevers and now headache and runny nose.  At home COVID-19 test was positive but she wants to confirm that the test was correct.  She has received 1 vaccine for COVID-19.  Patient is on birth control and does not think she is pregnant.  No history of lung disease Past Medical History:  Diagnosis Date   Kidney stone    Nexplanon removal 06/09/2014   Syncope     Patient Active Problem List   Diagnosis Date Noted   Closed nondisplaced fracture of shaft of left clavicle 03/30/2020   Nondisplaced fracture of body of left scapula with routine healing 03/30/2020   Nexplanon insertion 08/22/2015   Susceptible to varicella (non-immune), currently pregnant 12/13/2014   Marijuana use 12/08/2014   Chlamydia infection 01/20/2013   Adolescent scoliosis 12/01/2012   Abnormal vision screen 12/01/2012   Hearing deficit 12/01/2012   Scoliosis 11/02/2010   Back pain 11/02/2010    History reviewed. No pertinent surgical history.   OB History     Gravida  1   Para  1   Term  1   Preterm  0   AB  0   Living  1      SAB  0   IAB  0   Ectopic  0   Multiple  0   Live Births  1           Family History  Problem Relation Age of Onset   Asthma Brother    Arthritis Paternal Grandmother    Heart Problems Paternal Grandmother    Crohn's disease Maternal Grandmother     Social History   Tobacco Use   Smoking status: Light Smoker    Years: 5.00    Pack years: 0.00    Types: Cigarettes, Cigars   Smokeless tobacco: Never   Tobacco comments:    about 2 cigs per day   Vaping Use   Vaping Use: Never used  Substance Use Topics   Alcohol use: Yes    Alcohol/week: 0.0 standard drinks    Comment: social   Drug use: No    Home Medications Prior to Admission medications   Medication Sig Start Date End Date Taking? Authorizing Provider  acetaminophen (TYLENOL) 500 MG tablet Take 1,000 mg by mouth every 6 (six) hours as needed.    [provider]  etonogestrel (NEXPLANON) 68 MG IMPL implant 1 each by Subdermal route once.    [provider]    Allergies    Penicillins  Review of Systems   Review of Systems  Constitutional:  Positive for activity change and fever.  HENT:  Positive for congestion.   Gastrointestinal:  Negative for nausea and vomiting.  Neurological:  Positive for headaches.   Physical Exam Updated Vital Signs BP 110/65 (BP Location: Left Arm)   Pulse 86   Temp 98 F (36.7 C) (Oral)   Resp 16   Ht 4\' 10"  (1.473 m)   Wt 59 kg   SpO2 98%   BMI 27.17 kg/m  Physical Exam Vitals and nursing note reviewed.  Constitutional:      Appearance: She is well-developed.  HENT:     Head: Atraumatic.  Cardiovascular:     Rate and Rhythm: Normal rate.  Pulmonary:     Effort: Pulmonary effort is normal.  Musculoskeletal:     Cervical back: Normal range of motion and neck supple.  Skin:    General: Skin is warm and dry.  Neurological:     Mental Status: She is alert and oriented to person, place, and time.    ED Results / Procedures / Treatments   Labs (all labs ordered are listed, but only abnormal results are displayed) Labs Reviewed  SARS CORONAVIRUS 2 (TAT 6-24 HRS)    EKG None  Radiology No results found.  Procedures Procedures   Medications Ordered in ED Medications - No data to display  ED Course  I have reviewed the triage vital signs and the nursing notes.  Pertinent labs & imaging results that were available during my care of the patient were reviewed by me and considered in my medical  decision making (see chart for details).    MDM Rules/Calculators/A&P                          KEYRI SALBERG was evaluated in Emergency Department on 08/09/2020 for the symptoms described in the history of present illness. She was evaluated in the context of the global COVID-19 pandemic, which necessitated consideration that the patient might be at risk for infection with the SARS-CoV-2 virus that causes COVID-19. Institutional protocols and algorithms that pertain to the evaluation of patients at risk for COVID-19 are in a state of rapid change based on information released by regulatory bodies including the CDC and federal and state organizations. These policies and algorithms were followed during the patient's care in the ED.   Likely COVID.  She is clinically stable. Will get the outpatient COVID-19 test.  Final Clinical Impression(s) / ED Diagnoses Final diagnoses:  COVID-19    Rx / DC Orders ED Discharge Orders     None        Derwood Kaplan, MD 08/09/20 [phone]

## 2020-08-09 NOTE — ED Triage Notes (Addendum)
Pt presents to ED with complaints of headache x 4 days and runny nose. Pt states she has tried extra strength tylenol and theraflu without relief. Had positive covid test on yesterday

## 2020-08-09 NOTE — Discharge Instructions (Addendum)
Take tylenol for headaches and fevers. Hydrate well. Isolate for 5 days.

## 2020-08-10 ENCOUNTER — Telehealth: Payer: Self-pay | Admitting: *Deleted

## 2020-08-10 NOTE — Telephone Encounter (Signed)
Transition Care Management Unsuccessful Follow-up Telephone Call  Date of discharge and from where:  08/09/2020 - [facility]  Attempts:  1st Attempt  Reason for unsuccessful TCM follow-up call:  Left voice message 

## 2020-08-11 NOTE — Telephone Encounter (Signed)
Transition Care Management Unsuccessful Follow-up Telephone Call  Date of discharge and from where:  08/11/2020- [place] [facility]  Attempts:  2nd Attempt  Reason for unsuccessful TCM follow-up call:  Left voice message    

## 2020-08-12 NOTE — Telephone Encounter (Signed)
Transition Care Management Follow-up Telephone Call Date of discharge and from where: 08/09/2020 - [facility] How have you been since you were released from the hospital? "I am doing okay" Any questions or concerns? No  Items Reviewed: Did the pt receive and understand the discharge instructions provided? Yes  Medications obtained and verified? Yes  Other? No  Any new allergies since your discharge? No  Dietary orders reviewed? No Do you have support at home? Yes    Functional Questionnaire: (I = Independent and D = Dependent) ADLs: I  Bathing/Dressing- I  Meal Prep- I  Eating- I  Maintaining continence- I  Transferring/Ambulation- I  Managing Meds- I  Follow up appointments reviewed:  PCP Hospital f/u appt confirmed? No   Specialist Hospital f/u appt confirmed? No   Are transportation arrangements needed?  N/A If their condition worsens, is the pt aware to call PCP or go to the Emergency Dept.? Yes Was the patient provided with contact information for the PCP's office or ED? Yes Was to pt encouraged to call back with questions or concerns? Yes

## 2020-10-24 ENCOUNTER — Emergency Department (HOSPITAL_COMMUNITY)
Admission: EM | Admit: 2020-10-24 | Discharge: 2020-10-24 | Disposition: A | Discharge status: Home / Self Care | Payer: Medicaid Other | Attending: Emergency Medicine | Admitting: Emergency Medicine

## 2020-10-24 ENCOUNTER — Other Ambulatory Visit: Payer: Self-pay

## 2020-10-24 ENCOUNTER — Encounter (HOSPITAL_COMMUNITY): Payer: Self-pay

## 2020-10-24 DIAGNOSIS — D72829 Elevated white blood cell count, unspecified: Secondary | ICD-10-CM | POA: Insufficient documentation

## 2020-10-24 DIAGNOSIS — R1031 Right lower quadrant pain: Secondary | ICD-10-CM | POA: Insufficient documentation

## 2020-10-24 DIAGNOSIS — R509 Fever, unspecified: Secondary | ICD-10-CM | POA: Diagnosis not present

## 2020-10-24 DIAGNOSIS — Z20822 Contact with and (suspected) exposure to covid-19: Secondary | ICD-10-CM | POA: Insufficient documentation

## 2020-10-24 DIAGNOSIS — R000 Tachycardia, unspecified: Secondary | ICD-10-CM | POA: Diagnosis not present

## 2020-10-24 DIAGNOSIS — N390 Urinary tract infection, site not specified: Secondary | ICD-10-CM

## 2020-10-24 DIAGNOSIS — F17210 Nicotine dependence, cigarettes, uncomplicated: Secondary | ICD-10-CM | POA: Diagnosis not present

## 2020-10-24 LAB — CBC WITH DIFFERENTIAL/PLATELET
Abs Immature Granulocytes: 0.04 10*3/uL (ref 0.00–0.07)
Basophils Absolute: 0 10*3/uL (ref 0.0–0.1)
Basophils Relative: 0 %
Eosinophils Absolute: 0 10*3/uL (ref 0.0–0.5)
Eosinophils Relative: 0 %
HCT: 29.5 % — ABNORMAL LOW (ref 36.0–46.0)
Hemoglobin: 10 g/dL — ABNORMAL LOW (ref 12.0–15.0)
Immature Granulocytes: 0 %
Lymphocytes Relative: 7 %
Lymphs Abs: 0.8 10*3/uL (ref 0.7–4.0)
MCH: 32.8 pg (ref 26.0–34.0)
MCHC: 33.9 g/dL (ref 30.0–36.0)
MCV: 96.7 fL (ref 80.0–100.0)
Monocytes Absolute: 1 10*3/uL (ref 0.1–1.0)
Monocytes Relative: 8 %
Neutro Abs: 9.6 10*3/uL — ABNORMAL HIGH (ref 1.7–7.7)
Neutrophils Relative %: 85 %
Platelets: 141 10*3/uL — ABNORMAL LOW (ref 150–400)
RBC: 3.05 MIL/uL — ABNORMAL LOW (ref 3.87–5.11)
RDW: 13 % (ref 11.5–15.5)
WBC: 11.4 10*3/uL — ABNORMAL HIGH (ref 4.0–10.5)
nRBC: 0 % (ref 0.0–0.2)

## 2020-10-24 LAB — URINALYSIS, ROUTINE W REFLEX MICROSCOPIC
Bilirubin Urine: NEGATIVE
Glucose, UA: NEGATIVE mg/dL
Ketones, ur: 80 mg/dL — AB
Nitrite: NEGATIVE
Protein, ur: 100 mg/dL — AB
Specific Gravity, Urine: 1.018 (ref 1.005–1.030)
WBC, UA: >50 WBC/hpf — ABNORMAL HIGH (ref 0–5)
pH: 5 (ref 5.0–8.0)

## 2020-10-24 LAB — RESP PANEL BY RT-PCR (FLU A&B, COVID) ARPGX2
Influenza A by PCR: NEGATIVE
Influenza B by PCR: NEGATIVE
SARS Coronavirus 2 by RT PCR: NEGATIVE

## 2020-10-24 LAB — BASIC METABOLIC PANEL
Anion gap: 7 (ref 5–15)
BUN: 8 mg/dL (ref 6–20)
CO2: 23 mmol/L (ref 22–32)
Calcium: 8.1 mg/dL — ABNORMAL LOW (ref 8.9–10.3)
Chloride: 103 mmol/L (ref 98–111)
Creatinine, Ser: 0.6 mg/dL (ref 0.44–1.00)
GFR, Estimated: >60 mL/min (ref >60)
Glucose, Bld: 137 mg/dL — ABNORMAL HIGH (ref 70–99)
Potassium: 3.4 mmol/L — ABNORMAL LOW (ref 3.5–5.1)
Sodium: 133 mmol/L — ABNORMAL LOW (ref 135–145)

## 2020-10-24 LAB — PREGNANCY, URINE: Preg Test, Ur: NEGATIVE

## 2020-10-24 MED ORDER — SODIUM CHLORIDE 0.9 % IV SOLN
1.0000 g | Freq: Once | INTRAVENOUS | Status: AC
  Administered 2020-10-24: 1 g via INTRAVENOUS
  Filled 2020-10-24: qty 10

## 2020-10-24 MED ORDER — CEPHALEXIN 500 MG PO CAPS: 500.0000 mg | ORAL_CAPSULE | Freq: Three times a day (TID) | ORAL | 0 refills | Status: AC

## 2020-10-24 MED ORDER — ACETAMINOPHEN 325 MG PO TABS
650.0000 mg | ORAL_TABLET | Freq: Once | ORAL | Status: AC
  Administered 2020-10-24: 650 mg via ORAL
  Filled 2020-10-24: qty 2

## 2020-10-24 MED ORDER — SODIUM CHLORIDE 0.9 % IV BOLUS
1000.0000 mL | Freq: Once | INTRAVENOUS | Status: AC
  Administered 2020-10-24: 1000 mL via INTRAVENOUS

## 2020-10-24 NOTE — ED Provider Notes (Signed)
[facility] EMERGENCY DEPARTMENT Provider Note  CSN: [id number] Arrival date & time: 10/24/20 1950    History Chief Complaint  Patient presents with  . Covid Exposure    Jacqueline Koch is a 25 y.o. female with a prior history of kidney stones, reports she has had chills, fever and R sided abdominal pain for the last 2 days. She had some diarrhea initially but that has stopped. She was exposed to Covid last week but denies cough, SOB, sore throat, nausea or vomiting. She has not had any dysuria.    Past Medical History:  Diagnosis Date  . Kidney stone   . Nexplanon removal 06/09/2014  . Syncope     History reviewed. No pertinent surgical history.  Family History  Problem Relation Age of Onset  . Asthma Brother   . Arthritis Paternal Grandmother   . Heart Problems Paternal Grandmother   . Crohn's disease Maternal Grandmother     Social History   Tobacco Use  . Smoking status: Light Smoker    Years: 5.00    Types: Cigarettes, Cigars  . Smokeless tobacco: Never  . Tobacco comments:    about 2 cigs per day  Vaping Use  . Vaping Use: Never used  Substance Use Topics  . Alcohol use: Yes    Alcohol/week: 0.0 standard drinks    Comment: social  . Drug use: No     Home Medications Prior to Admission medications   Medication Sig Start Date End Date Taking? Authorizing Provider  acetaminophen (TYLENOL) 500 MG tablet Take 1,000 mg by mouth every 6 (six) hours as needed.    [provider]  etonogestrel (NEXPLANON) 68 MG IMPL implant 1 each by Subdermal route once.    [provider]     Allergies    Penicillins   Review of Systems   Review of Systems A comprehensive review of systems was completed and negative except as noted in HPI.    Physical Exam BP (!) 102/59 (BP Location: Right Arm)   Pulse (!) 107   Temp (!) 101.9 F (38.8 C) (Oral)   Resp 18   Ht 4\' 11"  (1.499 m)   Wt 59 kg   SpO2 99%   BMI 26.26 kg/m   Physical Exam Vitals  and nursing note reviewed.  Constitutional:      Appearance: Normal appearance.  HENT:     Head: Normocephalic and atraumatic.     Nose: Nose normal.     Mouth/Throat:     Mouth: Mucous membranes are moist.  Eyes:     Extraocular Movements: Extraocular movements intact.     Conjunctiva/sclera: Conjunctivae normal.  Cardiovascular:     Rate and Rhythm: Tachycardia present.  Pulmonary:     Effort: Pulmonary effort is normal.     Breath sounds: Normal breath sounds.  Abdominal:     General: Abdomen is flat.     Palpations: Abdomen is soft.     Tenderness: There is abdominal tenderness (mild RLQ without peritoneal signs). There is no guarding.  Musculoskeletal:        General: No swelling. Normal range of motion.     Cervical back: Neck supple.  Skin:    General: Skin is warm and dry.  Neurological:     General: No focal deficit present.     Mental Status: She is alert.  Psychiatric:        Mood and Affect: Mood normal.     ED Results / Procedures /  Treatments   Labs (all labs ordered are listed, but only abnormal results are displayed) Labs Reviewed  RESP PANEL BY RT-PCR (FLU A&B, COVID) ARPGX2  URINALYSIS, ROUTINE W REFLEX MICROSCOPIC  PREGNANCY, URINE    EKG None  Radiology No results found.  Procedures Procedures  Medications Ordered in the ED Medications - No data to display   MDM Rules/Calculators/A&P MDM Patient with recent covid exposure here with fever and R sided abdominal pain. Exam is not consistent with acute appendicitis. Will check UA, swab for Covid and give APAP.   ED Course  I have reviewed the triage vital signs and the nursing notes.  Pertinent labs & imaging results that were available during my care of the patient were reviewed by me and considered in my medical decision making (see chart for details).  Clinical Course as of 10/24/20 1024  Mon Oct 24, 2020  0086 UA is positive for infection, also has large Ketones. Given fever and  tachycardia, will check CBC, BMP and give IVF. Low suspicion for ascending UTI given her minimal symptoms.  [CS]  0939 CBC shows mild anemia and leukocytosis. BMP is unremarkable.  [CS]  1011 Covid is neg. Patient feeling better. Plan discharge with Rx for Keflex, PCP follow up. Advised to return for worsening pain, vomiting or any other concerns.  [CS]    Clinical Course User Index [CS] Pollyann Savoy, MD    Final Clinical Impression(s) / ED Diagnoses Final diagnoses:  None    Rx / DC Orders ED Discharge Orders     None        Pollyann Savoy, MD 10/24/20 1024

## 2020-10-24 NOTE — ED Triage Notes (Signed)
Pt exposed to COVID Thursday. Pt complains of side pain when taking deep breaths, light headedness with position changes, and fatigue.

## 2020-10-25 ENCOUNTER — Telehealth: Payer: Self-pay

## 2020-10-25 NOTE — Telephone Encounter (Signed)
Transition Care Management Unsuccessful Follow-up Telephone Call  Date of discharge and from where:  10/24/2020-[place]  Attempts:  1st Attempt  Reason for unsuccessful TCM follow-up call:  Left voice message    

## 2020-10-27 NOTE — Telephone Encounter (Signed)
Transition Care Management Unsuccessful Follow-up Telephone Call  Date of discharge and from where:  10/24/2020 from [facility]  Attempts:  2nd Attempt  Reason for unsuccessful TCM follow-up call:  Left voice message    

## 2020-10-30 NOTE — Telephone Encounter (Signed)
Transition Care Management Unsuccessful Follow-up Telephone Call  Date of discharge and from where:  10/24/2020-[facility]  Attempts:  3rd Attempt  Reason for unsuccessful TCM follow-up call:  Left voice message    

## 2020-12-28 ENCOUNTER — Encounter: Payer: Self-pay | Admitting: Emergency Medicine

## 2020-12-28 ENCOUNTER — Ambulatory Visit
Admission: EM | Admit: 2020-12-28 | Discharge: 2020-12-28 | Disposition: A | Discharge status: Home / Self Care | Payer: Medicaid Other | Attending: Family Medicine | Admitting: Family Medicine

## 2020-12-28 ENCOUNTER — Other Ambulatory Visit: Payer: Self-pay

## 2020-12-28 DIAGNOSIS — R300 Dysuria: Secondary | ICD-10-CM | POA: Diagnosis not present

## 2020-12-28 DIAGNOSIS — R1031 Right lower quadrant pain: Secondary | ICD-10-CM | POA: Insufficient documentation

## 2020-12-28 LAB — POCT URINALYSIS DIP (MANUAL ENTRY)
Bilirubin, UA: NEGATIVE
Blood, UA: NEGATIVE
Glucose, UA: NEGATIVE mg/dL
Ketones, POC UA: NEGATIVE mg/dL
Nitrite, UA: NEGATIVE
Protein Ur, POC: NEGATIVE mg/dL
Spec Grav, UA: 1.025 (ref 1.010–1.025)
Urobilinogen, UA: 0.2 E.U./dL
pH, UA: 7.5 (ref 5.0–8.0)

## 2020-12-28 LAB — POCT URINE PREGNANCY: Preg Test, Ur: NEGATIVE

## 2020-12-28 MED ORDER — CEPHALEXIN 500 MG PO CAPS: 500.0000 mg | ORAL_CAPSULE | Freq: Two times a day (BID) | ORAL | 0 refills | Status: DC

## 2020-12-28 NOTE — ED Triage Notes (Signed)
Patient c/o RLQ ABD pain and vaginal discharge x 2 days.   Patient denies N/V/D.   Patient endorses foul odor discharge.   Patient endorses dysuria.   Patient endorses having a UTI and finished antibiotic regimen 2 weeks ago.

## 2020-12-28 NOTE — ED Provider Notes (Signed)
[facility]   [id number] 12/28/20 Arrival Time: 1016  ASSESSMENT & PLAN:  1. Abdominal discomfort in right lower quadrant   2. Dysuria    Will tx for cystitis. UPT negative. Begin: Meds ordered this encounter  Medications   cephALEXin (KEFLEX) 500 MG capsule    Sig: Take 1 capsule (500 mg total) by mouth 2 (two) times daily.    Dispense:  10 capsule    Refill:  0   s    Discharge Instructions      You have had labs (urine culture and a vaginal swab) sent today. We will call you with any significant abnormalities or if there is need to begin or change treatment or pursue further follow up.  You may also review your test results online through MyChart. If you do not have a MyChart account, instructions to sign up should be on your discharge paperwork.  You have been seen today for abdominal pain. Your evaluation was not suggestive of any emergent condition requiring medical intervention at this time. However, some abdominal problems make take more time to appear. Therefore, it is very important for you to pay attention to any new symptoms or worsening of your current condition.  Please return here or to the Emergency Department immediately should you begin to feel worse in any way or have any of the following symptoms: increasing or different abdominal pain, persistent vomiting, inability to drink fluids, fevers, or shaking chills.       Without s/s of PID.  Labs Reviewed  POCT URINALYSIS DIP (MANUAL ENTRY) - Abnormal; Notable for the following components:      Result Value   Leukocytes, UA Small (1+) (*)    All other components within normal limits  URINE CULTURE  POCT URINE PREGNANCY  CERVICOVAGINAL ANCILLARY ONLY     Will notify of any positive results. Instructed to refrain from sexual activity for at least seven days.  Reviewed expectations re: course of current medical issues. Questions answered. Outlined signs and symptoms indicating need for  more acute intervention. Patient verbalized understanding. After Visit Summary given.   SUBJECTIVE:  Jacqueline Koch is a 25 y.o. female who presents with complaint of RLQ discomfort; same symptoms with UTI 10/2020; mild dysuria. Onset gradual. First noticed  past 1-2 d . Mild vaginal discharge. Questions odor. No specific aggravating or alleviating factors reported. Denies: gross hematuria. Afebrile. No abdominal or pelvic pain. Normal PO intake wihout n/v. No genital rashes or lesions. Reports that she is sexually active.   Patient's last menstrual period was 12/14/2020 (approximate).   OBJECTIVE:  Vitals:   12/28/20 1145  BP: 106/68  Pulse: 82  Resp: 16  Temp: 98 F (36.7 C)  TempSrc: Oral  SpO2: 95%     General appearance: alert, cooperative, appears stated age and no distress Lungs: unlabored respirations; speaks full sentences without difficulty Back: no CVA tenderness; FROM at waist Abdomen: soft, non-tender GU: deferred Skin: warm and dry Psychological: alert and cooperative; normal mood and affect.  Results for orders placed or performed during the hospital encounter of 12/28/20  POCT urinalysis dipstick  Result Value Ref Range   Color, UA yellow yellow   Clarity, UA clear clear   Glucose, UA negative negative mg/dL   Bilirubin, UA negative negative   Ketones, POC UA negative negative mg/dL   Spec Grav, UA 8.938 1.017 - 1.025   Blood, UA negative negative   pH, UA 7.5 5.0 - 8.0   Protein Ur, POC  negative negative mg/dL   Urobilinogen, UA 0.2 0.2 or 1.0 E.U./dL   Nitrite, UA Negative Negative   Leukocytes, UA Small (1+) (A) Negative  POCT urine pregnancy  Result Value Ref Range   Preg Test, Ur Negative Negative    Labs Reviewed  POCT URINALYSIS DIP (MANUAL ENTRY) - Abnormal; Notable for the following components:      Result Value   Leukocytes, UA Small (1+) (*)    All other components within normal limits  URINE CULTURE  POCT URINE PREGNANCY   CERVICOVAGINAL ANCILLARY ONLY    Allergies  Allergen Reactions   Penicillins Other (See Comments)    Pt states she is not sure, she has been allergic since she was younger     Past Medical History:  Diagnosis Date   Kidney stone    Nexplanon removal 06/09/2014   Syncope    Family History  Problem Relation Age of Onset   Asthma Brother    Arthritis Paternal Grandmother    Heart Problems Paternal Grandmother    Crohn's disease Maternal Grandmother    Social History   Socioeconomic History   Marital status: Single    Spouse name: Not on file   Number of children: Not on file   Years of education: in 10th   Highest education level: Not on file  Occupational History   Occupation: student  Tobacco Use   Smoking status: Light Smoker    Years: 5.00    Types: Cigarettes, Cigars   Smokeless tobacco: Never   Tobacco comments:    about 2 cigs per day  Vaping Use   Vaping Use: Never used  Substance and Sexual Activity   Alcohol use: Yes    Alcohol/week: 0.0 standard drinks    Comment: social   Drug use: No   Sexual activity: Yes    Birth control/protection: Implant  Other Topics Concern   Not on file  Social History Narrative   Not on file   Social Determinants of Health   Financial Resource Strain: Not on file  Food Insecurity: Not on file  Transportation Needs: Not on file  Physical Activity: Not on file  Stress: Not on file  Social Connections: Not on file  Intimate Partner Violence: Not on file           Mardella Layman, MD 12/28/20 1208

## 2020-12-28 NOTE — Discharge Instructions (Addendum)
You have had labs (urine culture and a vaginal swab) sent today. We will call you with any significant abnormalities or if there is need to begin or change treatment or pursue further follow up.  You may also review your test results online through MyChart. If you do not have a MyChart account, instructions to sign up should be on your discharge paperwork.  You have been seen today for abdominal pain. Your evaluation was not suggestive of any emergent condition requiring medical intervention at this time. However, some abdominal problems make take more time to appear. Therefore, it is very important for you to pay attention to any new symptoms or worsening of your current condition.  Please return here or to the Emergency Department immediately should you begin to feel worse in any way or have any of the following symptoms: increasing or different abdominal pain, persistent vomiting, inability to drink fluids, fevers, or shaking chills.

## 2020-12-30 LAB — CERVICOVAGINAL ANCILLARY ONLY
Bacterial Vaginitis (gardnerella): POSITIVE — AB
Candida Glabrata: NEGATIVE
Candida Vaginitis: NEGATIVE
Chlamydia: NEGATIVE
Comment: NEGATIVE
Comment: NEGATIVE
Comment: NEGATIVE
Comment: NEGATIVE
Comment: NEGATIVE
Comment: NORMAL
Neisseria Gonorrhea: NEGATIVE
Trichomonas: POSITIVE — AB

## 2020-12-30 LAB — URINE CULTURE: Culture: <10000 — AB

## 2020-12-31 ENCOUNTER — Telehealth (HOSPITAL_COMMUNITY): Payer: Self-pay | Admitting: Emergency Medicine

## 2020-12-31 MED ORDER — METRONIDAZOLE 500 MG PO TABS: 500.0000 mg | ORAL_TABLET | Freq: Two times a day (BID) | ORAL | 0 refills | Status: DC

## 2021-04-05 ENCOUNTER — Encounter (HOSPITAL_COMMUNITY): Payer: Self-pay | Admitting: Emergency Medicine

## 2021-04-05 ENCOUNTER — Other Ambulatory Visit: Payer: Self-pay

## 2021-04-05 ENCOUNTER — Emergency Department (HOSPITAL_COMMUNITY): Payer: Medicaid Other

## 2021-04-05 ENCOUNTER — Emergency Department (HOSPITAL_COMMUNITY)
Admission: EM | Admit: 2021-04-05 | Discharge: 2021-04-05 | Disposition: A | Discharge status: Home / Self Care | Payer: Medicaid Other | Attending: Emergency Medicine | Admitting: Emergency Medicine

## 2021-04-05 DIAGNOSIS — J019 Acute sinusitis, unspecified: Secondary | ICD-10-CM | POA: Diagnosis not present

## 2021-04-05 DIAGNOSIS — Z20822 Contact with and (suspected) exposure to covid-19: Secondary | ICD-10-CM | POA: Insufficient documentation

## 2021-04-05 DIAGNOSIS — R051 Acute cough: Secondary | ICD-10-CM

## 2021-04-05 DIAGNOSIS — J3489 Other specified disorders of nose and nasal sinuses: Secondary | ICD-10-CM | POA: Insufficient documentation

## 2021-04-05 DIAGNOSIS — J0191 Acute recurrent sinusitis, unspecified: Secondary | ICD-10-CM | POA: Diagnosis not present

## 2021-04-05 DIAGNOSIS — R059 Cough, unspecified: Secondary | ICD-10-CM | POA: Diagnosis not present

## 2021-04-05 LAB — RESP PANEL BY RT-PCR (FLU A&B, COVID) ARPGX2
Influenza A by PCR: NEGATIVE
Influenza B by PCR: NEGATIVE
SARS Coronavirus 2 by RT PCR: NEGATIVE

## 2021-04-05 MED ORDER — CEPHALEXIN 500 MG PO CAPS
1000.0000 mg | ORAL_CAPSULE | Freq: Once | ORAL | Status: AC
Start: 2021-04-05 — End: 2021-04-05
  Administered 2021-04-05: 1000 mg via ORAL
  Filled 2021-04-05: qty 2

## 2021-04-05 MED ORDER — CEPHALEXIN 500 MG PO CAPS: 500.0000 mg | ORAL_CAPSULE | Freq: Four times a day (QID) | ORAL | 0 refills | Status: DC

## 2021-04-05 NOTE — ED Triage Notes (Signed)
Pt c/o cough since 2/21. Denies fever, sob. Endorses headaches.  ?

## 2021-04-05 NOTE — ED Provider Notes (Signed)
?[facility] EMERGENC[facility] DEPART[facility] ?Provider Note ? ? ?CSN: [id number] ?Arrival date & time: 04/05/21  0118 ? ?  ? ?History ? ?Chief Complaint  ?Patient presents with  ? Cough  ? ? ?Jacqueline Koch is a 26 y.o. female. ? ? ?Cough ?Cough characteristics:  Productive ?Sputum characteristics:  Yellow and green ?Severity:  Mild ?Onset quality:  Gradual ?Duration:  9 days ?Timing:  Constant ?Progression:  Worsening ?Chronicity:  New ?Smoker: yes   ?Relieved by:  Nothing ?Worsened by:  Nothing ?Associated symptoms: fever   ? ?  ? ?Home Medications ?Prior to Admission medications   ?Medication Sig Start Date End Date Taking? Authorizing Provider  ?cephALEXin (KEFLEX) 500 MG capsule Take 1 capsule (500 mg total) by mouth 4 (four) times daily. 04/05/21  Yes Fernie Grimm, Barbara Cower, MD  ?acetaminophen (TYLENOL) 500 MG tablet Take 1,000 mg by mouth every 6 (six) hours as needed for mild pain.    [provider]  ?etonogestrel (NEXPLANON) 68 MG IMPL implant 1 each by Subdermal route once.    [provider]  ?   ? ?Allergies    ?Penicillins   ? ?Review of Systems   ?Review of Systems  ?Constitutional:  Positive for fever.  ?Respiratory:  Positive for cough.   ? ?Physical Exam ?Updated Vital Signs ?BP 115/88   Pulse 88   Temp 98.3 ?F (36.8 ?C) (Oral)   Resp 18   Ht 4\' 11"  (1.499 m)   Wt 57.6 kg   SpO2 99%   BMI 25.65 kg/m?  ?Physical Exam ?Vitals and nursing note reviewed.  ?Constitutional:   ?   Appearance: She is well-developed.  ?HENT:  ?   Head: Normocephalic and atraumatic.  ?   Nose: Rhinorrhea present. No congestion.  ?   Mouth/Throat:  ?   Mouth: Mucous membranes are moist.  ?   Pharynx: Oropharynx is clear.  ?Eyes:  ?   Pupils: Pupils are equal, round, and reactive to light.  ?Cardiovascular:  ?   Rate and Rhythm: Normal rate and regular rhythm.  ?Pulmonary:  ?   Effort: No respiratory distress.  ?   Breath sounds: No stridor.  ?Abdominal:  ?   General: Abdomen is flat. There is no distension.  ?Musculoskeletal:      ?   General: No swelling or tenderness. Normal range of motion.  ?   Cervical back: Normal range of motion.  ?Skin: ?   General: Skin is warm and dry.  ?Neurological:  ?   General: No focal deficit present.  ?   Mental Status: She is alert.  ? ? ?ED Results / Procedures / Treatments   ?Labs ?(all labs ordered are listed, but only abnormal results are displayed) ?Labs Reviewed  ?RESP PANEL BY RT-PCR (FLU A&B, COVID) [id number]  ? ? ?EKG ?None ? ?Radiology ?DG Chest Portable 1 View ? ?Result Date: 04/05/2021 ?CLINICAL DATA:  Cough. EXAM: PORTABLE CHEST 1 VIEW COMPARISON:  Chest radiograph dated 12/04/2011 and CT dated 02/23/2020. FINDINGS: The heart size and mediastinal contours are within normal limits. Both lungs are clear. The visualized skeletal structures are unremarkable. IMPRESSION: No active disease. Electronically Signed   By: 02/25/2020 M.D.   On: 04/05/2021 02:24   ? ?Procedures ?Procedures  ? ? ?Medications Ordered in ED ?Medications  ?cephALEXin (KEFLEX) capsule 1,000 mg (1,000 mg Oral Given 04/05/21 0422)  ? ? ?ED Course/ Medical Decision Making/ A&P ?  ?                        ?  Medical Decision Making ?Amount and/or Complexity of Data Reviewed ?Radiology: ordered. ? ?Risk ?Prescription drug management. ? ? ?Likely bacterial sinusitis. Green/yellow d/c x 9 days with fever. Will treat for same. PCN allergy, will use keflex. Supportive care otherwise.  ? ?Final Clinical Impression(s) / ED Diagnoses ?Final diagnoses:  ?Acute cough  ?Acute recurrent sinusitis, unspecified location  ? ? ?Rx / DC Orders ?ED Discharge Orders   ? ?      Ordered  ?  cephALEXin (KEFLEX) 500 MG capsule  4 times daily       ? 04/05/21 0417  ? ?  ?  ? ?  ? ? ?  ?Marily Memos, MD ?04/05/21 [phone] ? ?

## 2021-04-06 ENCOUNTER — Telehealth: Payer: Self-pay

## 2021-04-06 NOTE — Telephone Encounter (Signed)
Transition Care Management Unsuccessful Follow-up Telephone Call ? ?Date of discharge and from where:  04/05/2021 from [facility] ? ?Attempts:  1st Attempt ? ?Reason for unsuccessful TCM follow-up call:  Left voice message ? ? ? ?

## 2021-04-07 NOTE — Telephone Encounter (Signed)
Transition Care Management Follow-up Telephone Call ?Date of discharge and from where: 04/05/2021 from [facility] ?How have you been since you were released from the hospital? Patient stated that she is feeling better and was able to pick up Rx for ABx.  ?Any questions or concerns? No ? ?Items Reviewed: ?Did the pt receive and understand the discharge instructions provided? Yes  ?Medications obtained and verified? Yes  ?Other? No  ?Any new allergies since your discharge? No  ?Dietary orders reviewed? No ?Do you have support at home? Yes  ? ?Functional Questionnaire: (I = Independent and D = Dependent) ?ADLs: I ? ?Bathing/Dressing- I ? ?Meal Prep- I ? ?Eating- I ? ?Maintaining continence- I ? ?Transferring/Ambulation- I ? ?Managing Meds- I ? ? ?Follow up appointments reviewed: ? ?PCP Hospital f/u appt confirmed? No  Patient is interested in est with a PCP. Information given to patient to est care.  ?Will Hospital f/u appt confirmed? No   ?Are transportation arrangements needed? No  ?If their condition worsens, is the pt aware to call PCP or go to the Emergency Dept.? Yes ?Was the patient provided with contact information for the PCP's office or ED? Yes ?Was to pt encouraged to call back with questions or concerns? Yes ? ?

## 2021-06-02 ENCOUNTER — Ambulatory Visit
Admission: EM | Admit: 2021-06-02 | Discharge: 2021-06-02 | Disposition: A | Discharge status: Home / Self Care | Payer: Medicaid Other | Attending: Nurse Practitioner | Admitting: Nurse Practitioner

## 2021-06-02 ENCOUNTER — Other Ambulatory Visit: Payer: Self-pay

## 2021-06-02 ENCOUNTER — Encounter: Payer: Self-pay | Admitting: Emergency Medicine

## 2021-06-02 DIAGNOSIS — K047 Periapical abscess without sinus: Secondary | ICD-10-CM

## 2021-06-02 MED ORDER — IBUPROFEN 800 MG PO TABS: 800.0000 mg | ORAL_TABLET | Freq: Three times a day (TID) | ORAL | 0 refills | Status: DC | PRN

## 2021-06-02 MED ORDER — CLINDAMYCIN HCL 300 MG PO CAPS: 300.0000 mg | ORAL_CAPSULE | Freq: Three times a day (TID) | ORAL | 0 refills | Status: AC

## 2021-06-02 NOTE — ED Triage Notes (Addendum)
Pt reports right sided oral pain last night with upper gum swelling. Pt reports woke up this morning and reports that has resolved and now has "knot" to right side of neck. Pt reports needs to have tooth pulled on right lower side but is unsure if related.  ?

## 2021-06-02 NOTE — Discharge Instructions (Signed)
Take medication as prescribed. ?Warm compresses to the affected area until symptoms improve. ?Warm salt water gargles 3-4 times daily. ?I am providing a dental resource guide for you to follow-up with.  You should have the tooth removed as soon as possible. ?Follow-up as needed. ?

## 2021-08-09 ENCOUNTER — Ambulatory Visit
Admission: EM | Admit: 2021-08-09 | Discharge: 2021-08-09 | Disposition: A | Discharge status: Home / Self Care | Payer: Medicaid Other | Attending: Nurse Practitioner | Admitting: Nurse Practitioner

## 2021-08-09 ENCOUNTER — Encounter: Payer: Self-pay | Admitting: Emergency Medicine

## 2021-08-09 DIAGNOSIS — K0889 Other specified disorders of teeth and supporting structures: Secondary | ICD-10-CM

## 2021-08-09 MED ORDER — CLINDAMYCIN HCL 150 MG PO CAPS: 450.0000 mg | ORAL_CAPSULE | Freq: Three times a day (TID) | ORAL | 0 refills | Status: AC

## 2021-08-09 NOTE — ED Provider Notes (Signed)
[facility]    CSN: [id number] Arrival date & time: 08/09/21  1133      History   Chief Complaint No chief complaint on file.   HPI Jacqueline Koch is a 26 y.o. female.   Patient presents with 2 days of dental pain in her right lower jaw.  Reports the pain is moderate and denies any fevers, nausea/vomiting, drainage in her mouth.  She does feel like her mouth is a little bit swollen and tender to touch on the outside.  She has not taken anything so far for the pain.  Reports that she has tried for a couple of months to get dental insurance, however she never received her card and never could schedule the dentist.  She is planning to do this in the next couple of days.     Past Medical History:  Diagnosis Date   Kidney stone    Nexplanon removal 06/09/2014   Syncope     Patient Active Problem List   Diagnosis Date Noted   Closed nondisplaced fracture of shaft of left clavicle 03/30/2020   Nondisplaced fracture of body of left scapula with routine healing 03/30/2020   Nexplanon insertion 08/22/2015   Susceptible to varicella (non-immune), currently pregnant 12/13/2014   Marijuana use 12/08/2014   Chlamydia infection 01/20/2013   Adolescent scoliosis 12/01/2012   Abnormal vision screen 12/01/2012   Hearing deficit 12/01/2012   Scoliosis 11/02/2010   Back pain 11/02/2010    History reviewed. No pertinent surgical history.  OB History     Gravida  1   Para  1   Term  1   Preterm  0   AB  0   Living  1      SAB  0   IAB  0   Ectopic  0   Multiple  0   Live Births  1            Home Medications    Prior to Admission medications   Medication Sig Start Date End Date Taking? Authorizing Provider  clindamycin (CLEOCIN) 150 MG capsule Take 3 capsules (450 mg total) by mouth every 8 (eight) hours for 7 days. 08/09/21 08/16/21 Yes Valentino Nose, NP  etonogestrel (NEXPLANON) 68 MG IMPL implant 1 each by Subdermal route once.    [provider]  ibuprofen (ADVIL) 800 MG tablet Take 1 tablet (800 mg total) by mouth every 8 (eight) hours as needed. 06/02/21   Leath-Warren, Sadie Haber, NP    Family History Family History  Problem Relation Age of Onset   Asthma Brother    Arthritis Paternal Grandmother    Heart Problems Paternal Grandmother    Crohn's disease Maternal Grandmother     Social History Social History   Tobacco Use   Smoking status: Light Smoker    Packs/day: 1.00    Years: 5.00    Total pack years: 5.00    Types: Cigarettes, Cigars   Smokeless tobacco: Never   Tobacco comments:    about 2 cigs per day  Vaping Use   Vaping Use: Never used  Substance Use Topics   Alcohol use: Yes    Alcohol/week: 0.0 standard drinks of alcohol    Comment: social   Drug use: No     Allergies   Penicillins   Review of Systems Review of Systems Per HPI  Physical Exam Triage Vital Signs ED Triage Vitals  Enc Vitals Group     BP 08/09/21 1217 98/64  Pulse Rate 08/09/21 1217 81     Resp 08/09/21 1217 18     Temp 08/09/21 1217 98.9 F (37.2 C)     Temp Source 08/09/21 1217 Oral     SpO2 08/09/21 1217 97 %     Weight --      Height --      Head Circumference --      Peak Flow --      Pain Score 08/09/21 1218 4     Pain Loc --      Pain Edu? --      Excl. in GC? --    No data found.  Updated Vital Signs BP 98/64 (BP Location: Right Arm)   Pulse 81   Temp 98.9 F (37.2 C) (Oral)   Resp 18   SpO2 97%   Visual Acuity Right Eye Distance:   Left Eye Distance:   Bilateral Distance:    Right Eye Near:   Left Eye Near:    Bilateral Near:     Physical Exam Vitals and nursing note reviewed.  Constitutional:      General: She is not in acute distress.    Appearance: Normal appearance. She is not toxic-appearing.  HENT:     Mouth/Throat:     Mouth: Mucous membranes are moist. Oral lesions present.     Dentition: Abnormal dentition. Gingival swelling and dental caries present.      Pharynx: Oropharynx is clear.     Tonsils: No tonsillar exudate.  Pulmonary:     Effort: Pulmonary effort is normal. No respiratory distress.  Lymphadenopathy:     Cervical: No cervical adenopathy.  Skin:    General: Skin is warm and dry.     Capillary Refill: Capillary refill takes less than 2 seconds.     Coloration: Skin is not jaundiced or pale.     Findings: No erythema.  Neurological:     Mental Status: She is alert and oriented to person, place, and time.  Psychiatric:        Behavior: Behavior is cooperative.      UC Treatments / Results  Labs (all labs ordered are listed, but only abnormal results are displayed) Labs Reviewed - No data to display  EKG   Radiology No results found.  Procedures Procedures (including critical care time)  Medications Ordered in UC Medications - No data to display  Initial Impression / Assessment and Plan / UC Course  I have reviewed the triage vital signs and the nursing notes.  Pertinent labs & imaging results that were available during my care of the patient were reviewed by me and considered in my medical decision making (see chart for details).    Patient is a very pleasant, well-appearing 26 year old female with dental pain.  I suspect dental abscess given the broken teeth inside her mouth as well as redness.  Treat with clindamycin 450 mg every 8 hours for 7 days.  Ibuprofen 800 mg every 8 hours or Tylenol 500 to 1000 mg every 6 hours as needed for pain.  Cool compresses for pain as well.  Encourage close follow-up with dentist and resources given.  Seek care if symptoms persist or worsen despite treatment. Final Clinical Impressions(s) / UC Diagnoses   Final diagnoses:  Pain, dental     Discharge Instructions      - Start on the clindamycin 450 mg every 8 hours for 7 days -You can use Tylenol or ibuprofen 800 mg every 8 hours as needed for  pain as well as a cool compress -Please follow-up with a dentist-resources are  at the back of this paperwork     ED Prescriptions     Medication Sig Dispense Auth. Provider   clindamycin (CLEOCIN) 150 MG capsule Take 3 capsules (450 mg total) by mouth every 8 (eight) hours for 7 days. 63 capsule Valentino Nose, NP      PDMP not reviewed this encounter.   Valentino Nose, NP 08/09/21 1242

## 2021-08-09 NOTE — ED Triage Notes (Signed)
Dental pain on right lower side of mouth since yesterday.  States she feels a knot in that area.

## 2021-08-09 NOTE — Discharge Instructions (Signed)
-   Start on the clindamycin 450 mg every 8 hours for 7 days -You can use Tylenol or ibuprofen 800 mg every 8 hours as needed for pain as well as a cool compress -Please follow-up with a dentist-resources are at the back of this paperwork

## 2021-09-23 ENCOUNTER — Ambulatory Visit
Admission: EM | Admit: 2021-09-23 | Discharge: 2021-09-23 | Disposition: A | Discharge status: Home / Self Care | Payer: Medicaid Other | Attending: Nurse Practitioner | Admitting: Nurse Practitioner

## 2021-09-23 DIAGNOSIS — N760 Acute vaginitis: Secondary | ICD-10-CM | POA: Insufficient documentation

## 2021-09-23 DIAGNOSIS — Z113 Encounter for screening for infections with a predominantly sexual mode of transmission: Secondary | ICD-10-CM | POA: Insufficient documentation

## 2021-09-23 DIAGNOSIS — N898 Other specified noninflammatory disorders of vagina: Secondary | ICD-10-CM | POA: Diagnosis not present

## 2021-09-23 MED ORDER — FLUCONAZOLE 150 MG PO TABS: 150.0000 mg | ORAL_TABLET | Freq: Every day | ORAL | 0 refills | Status: DC

## 2021-09-23 NOTE — ED Triage Notes (Signed)
Pt reports white vaginal discharge and vaginal itching x 1 week. Pt has not trued nay medications for complaints.

## 2021-09-23 NOTE — Discharge Instructions (Addendum)
Take medication as prescribed. As discussed, the cytology, HIV/syphilis test are pending.  If the results are positive, you will be contacted to discuss treatment. Increase condom use with each sexual encounter. Follow-up as needed.

## 2021-09-23 NOTE — ED Provider Notes (Signed)
[facility]    CSN: [id number] Arrival date & time: 09/23/21  1342      History   Chief Complaint Chief Complaint  Patient presents with   Vaginal Discharge    HPI Jacqueline Koch is a 26 y.o. female.   The history is provided by the patient.   Patient presents for complaints of vaginal discharge, vaginal odor, and vaginal itching.  Patient states symptoms have been present for the past week.  Patient states discharge is "white and thick".  She states that the odor is not a fishy odor, but is an abnormal smell.  She denies abdominal pain, nausea, vomiting, diarrhea, urinary symptoms, or low back pain.  Patient states she is sexually active with 1 female partner in the past 90 days.  Patient has a history of STIs in the past.  She has not taken any medication for her symptoms.  Patient currently has the Nexplanon implant for birth control.  Past Medical History:  Diagnosis Date   Kidney stone    Nexplanon removal 06/09/2014   Syncope     Patient Active Problem List   Diagnosis Date Noted   Closed nondisplaced fracture of shaft of left clavicle 03/30/2020   Nondisplaced fracture of body of left scapula with routine healing 03/30/2020   Nexplanon insertion 08/22/2015   Susceptible to varicella (non-immune), currently pregnant 12/13/2014   Marijuana use 12/08/2014   Chlamydia infection 01/20/2013   Adolescent scoliosis 12/01/2012   Abnormal vision screen 12/01/2012   Hearing deficit 12/01/2012   Scoliosis 11/02/2010   Back pain 11/02/2010    History reviewed. No pertinent surgical history.  OB History     Gravida  1   Para  1   Term  1   Preterm  0   AB  0   Living  1      SAB  0   IAB  0   Ectopic  0   Multiple  0   Live Births  1            Home Medications    Prior to Admission medications   Medication Sig Start Date End Date Taking? Authorizing Provider  fluconazole (DIFLUCAN) 150 MG tablet Take 1 tablet (150 mg total) by mouth  daily. Take 1 tablet by mouth today.  If symptoms continue to persist after 72 hours, take the second tablet. 09/23/21  Yes Windy Dudek-Warren, Sadie Haber, NP  etonogestrel (NEXPLANON) 68 MG IMPL implant 1 each by Subdermal route once.    [provider]  ibuprofen (ADVIL) 800 MG tablet Take 1 tablet (800 mg total) by mouth every 8 (eight) hours as needed. 06/02/21   Cathalina Barcia-Warren, Sadie Haber, NP    Family History Family History  Problem Relation Age of Onset   Asthma Brother    Arthritis Paternal Grandmother    Heart Problems Paternal Grandmother    Crohn's disease Maternal Grandmother     Social History Social History   Tobacco Use   Smoking status: Light Smoker    Packs/day: 1.00    Years: 5.00    Total pack years: 5.00    Types: Cigarettes, Cigars   Smokeless tobacco: Never   Tobacco comments:    about 2 cigs per day  Vaping Use   Vaping Use: Never used  Substance Use Topics   Alcohol use: Yes    Alcohol/week: 0.0 standard drinks of alcohol    Comment: social   Drug use: No     Allergies  Penicillins   Review of Systems Review of Systems Per HPI  Physical Exam Triage Vital Signs ED Triage Vitals [09/23/21 1409]  Enc Vitals Group     BP 110/70     Pulse Rate 99     Resp 16     Temp 98.8 F (37.1 C)     Temp Source Oral     SpO2 98 %     Weight      Height      Head Circumference      Peak Flow      Pain Score 0     Pain Loc      Pain Edu?      Excl. in [place]?    No data found.  Updated Vital Signs BP 110/70 (BP Location: Right Arm)   Pulse 99   Temp 98.8 F (37.1 C) (Oral)   Resp 16   SpO2 98%   Visual Acuity Right Eye Distance:   Left Eye Distance:   Bilateral Distance:    Right Eye Near:   Left Eye Near:    Bilateral Near:     Physical Exam Vitals and nursing note reviewed.  Constitutional:      General: She is not in acute distress.    Appearance: Normal appearance.  Eyes:     Extraocular Movements: Extraocular movements  intact.     Pupils: Pupils are equal, round, and reactive to light.  Cardiovascular:     Rate and Rhythm: Normal rate and regular rhythm.     Pulses: Normal pulses.     Heart sounds: Normal heart sounds.  Pulmonary:     Effort: Pulmonary effort is normal.     Breath sounds: Normal breath sounds.  Abdominal:     General: Bowel sounds are normal.     Palpations: Abdomen is soft.  Genitourinary:    Comments: GU exam deferred, self swab performed  Skin:    General: Skin is warm and dry.  Neurological:     General: No focal deficit present.     Mental Status: She is alert and oriented to person, place, and time.  Psychiatric:        Mood and Affect: Mood normal.        Behavior: Behavior normal.      UC Treatments / Results  Labs (all labs ordered are listed, but only abnormal results are displayed) Labs Reviewed  HIV ANTIBODY (ROUTINE TESTING W REFLEX)  RPR  CERVICOVAGINAL ANCILLARY ONLY    EKG   Radiology No results found.  Procedures Procedures (including critical care time)  Medications Ordered in UC Medications - No data to display  Initial Impression / Assessment and Plan / UC Course  I have reviewed the triage vital signs and the nursing notes.  Pertinent labs & imaging results that were available during my care of the patient were reviewed by me and considered in my medical decision making (see chart for details).  Patient presents for complaints of vaginal discharge, vaginal odor, and vaginal itching.  Based on the patient's complaints, will treat patient empirically for vulvovaginitis.  Cytology, HIV/RPR test are pending at this time.  Patient encouraged to increase condom use with each sexual encounter.  Patient advised that she will be contacted if her test results are positive.  Patient advised to follow-up as needed. Final Clinical Impressions(s) / UC Diagnoses   Final diagnoses:  Vaginal discharge  Screening examination for sexually transmitted  disease  Vaginitis and vulvovaginitis  Discharge Instructions      Take medication as prescribed. As discussed, the cytology, HIV/syphilis test are pending.  If the results are positive, you will be contacted to discuss treatment. Increase condom use with each sexual encounter. Follow-up as needed.     ED Prescriptions     Medication Sig Dispense Auth. Provider   fluconazole (DIFLUCAN) 150 MG tablet Take 1 tablet (150 mg total) by mouth daily. Take 1 tablet by mouth today.  If symptoms continue to persist after 72 hours, take the second tablet. 2 tablet Jeremy Mclamb-Warren, Sadie Haber, NP      PDMP not reviewed this encounter.   Abran Cantor, NP 09/23/21 1446

## 2021-09-25 LAB — CERVICOVAGINAL ANCILLARY ONLY
Bacterial Vaginitis (gardnerella): NEGATIVE
Candida Glabrata: NEGATIVE
Candida Vaginitis: POSITIVE — AB
Chlamydia: NEGATIVE
Comment: NEGATIVE
Comment: NEGATIVE
Comment: NEGATIVE
Comment: NEGATIVE
Comment: NEGATIVE
Comment: NORMAL
Neisseria Gonorrhea: POSITIVE — AB
Trichomonas: NEGATIVE

## 2021-09-26 ENCOUNTER — Ambulatory Visit
Admission: EM | Admit: 2021-09-26 | Discharge: 2021-09-26 | Disposition: A | Discharge status: Home / Self Care | Payer: Medicaid Other | Attending: Family Medicine | Admitting: Family Medicine

## 2021-09-26 ENCOUNTER — Telehealth (HOSPITAL_COMMUNITY): Payer: Self-pay | Admitting: Emergency Medicine

## 2021-09-26 DIAGNOSIS — A549 Gonococcal infection, unspecified: Secondary | ICD-10-CM | POA: Diagnosis not present

## 2021-09-26 LAB — RPR: RPR Ser Ql: NONREACTIVE

## 2021-09-26 LAB — HIV ANTIBODY (ROUTINE TESTING W REFLEX): HIV Screen 4th Generation wRfx: NONREACTIVE

## 2021-09-26 MED ORDER — CEFTRIAXONE SODIUM 1 G IJ SOLR
500.0000 mg | Freq: Once | INTRAMUSCULAR | Status: AC
  Administered 2021-09-26: 500 mg via INTRAMUSCULAR

## 2021-09-26 NOTE — Telephone Encounter (Signed)
Per protocol, patient will need treatment with IM Rocephin 500mg for positive Gonorrhea.   Attempted to reach patient x 1, LVM Results seen on mychart [facility] notified 

## 2021-09-26 NOTE — ED Notes (Signed)
Pt denies any symptoms. Airway patent.

## 2021-09-26 NOTE — ED Notes (Signed)
Pt educated on signs and symptoms of reaction.

## 2021-09-26 NOTE — ED Triage Notes (Addendum)
Pt presents due to callback related to STD results.   Consulted provider regarding pt allergies. PA reported to discuss with pt potential for reaction and to watch pt 15-20 minutes post injection.

## 2021-12-14 ENCOUNTER — Encounter: Payer: Self-pay | Admitting: Obstetrics & Gynecology

## 2021-12-14 ENCOUNTER — Ambulatory Visit (INDEPENDENT_AMBULATORY_CARE_PROVIDER_SITE_OTHER): Payer: Medicaid Other | Admitting: Obstetrics & Gynecology

## 2021-12-14 ENCOUNTER — Other Ambulatory Visit (HOSPITAL_COMMUNITY)
Admission: RE | Admit: 2021-12-14 | Discharge: 2021-12-14 | Disposition: A | Discharge status: Home / Self Care | Payer: Medicaid Other | Source: Ambulatory Visit | Attending: Obstetrics & Gynecology | Admitting: Obstetrics & Gynecology

## 2021-12-14 VITALS — BP 96/61 | HR 78 | Ht 59.0 in | Wt 139.0 lb

## 2021-12-14 DIAGNOSIS — Z01419 Encounter for gynecological examination (general) (routine) without abnormal findings: Secondary | ICD-10-CM

## 2021-12-14 DIAGNOSIS — Z113 Encounter for screening for infections with a predominantly sexual mode of transmission: Secondary | ICD-10-CM

## 2021-12-14 DIAGNOSIS — Z0000 Encounter for general adult medical examination without abnormal findings: Secondary | ICD-10-CM | POA: Diagnosis not present

## 2021-12-14 NOTE — Progress Notes (Signed)
Subjective:     Jacqueline Koch is a 27 y.o. female here for a routine exam.  No LMP recorded. Patient has had an implant. G1P1001 Birth Control Method:  nexplanon, getting it out next week, no BCM afterwards Menstrual Calendar(currently): amenorrhea on nexplanon  Current complaints: none.   Current acute medical issues:  none   Recent Gynecologic History No LMP recorded. Patient has had an implant. Last Pap: 2017 normal Last mammogram: na,    Past Medical History:  Diagnosis Date   Kidney stone    Nexplanon removal 06/09/2014   Syncope     History reviewed. No pertinent surgical history.  OB History     Gravida  1   Para  1   Term  1   Preterm  0   AB  0   Living  1      SAB  0   IAB  0   Ectopic  0   Multiple  0   Live Births  1           Social History   Socioeconomic History   Marital status: Single    Spouse name: Not on file   Number of children: Not on file   Years of education: in 10th   Highest education level: Not on file  Occupational History   Occupation: student  Tobacco Use   Smoking status: Light Smoker    Packs/day: 1.00    Years: 5.00    Total pack years: 5.00    Types: Cigarettes, Cigars   Smokeless tobacco: Never   Tobacco comments:    about 2 cigs per day  Vaping Use   Vaping Use: Never used  Substance and Sexual Activity   Alcohol use: Yes    Alcohol/week: 0.0 standard drinks of alcohol    Comment: social   Drug use: No   Sexual activity: Yes    Birth control/protection: Implant  Other Topics Concern   Not on file  Social History Narrative   Not on file   Social Determinants of Health   Financial Resource Strain: Low Risk  (12/14/2021)   Overall Financial Resource Strain (CARDIA)    Difficulty of Paying Living Expenses: Not hard at all  Food Insecurity: No Food Insecurity (12/14/2021)   Hunger Vital Sign    Worried About Running Out of Food in the Last Year: Never true    Ran Out of Food in the Last Year:  Never true  Transportation Needs: No Transportation Needs (12/14/2021)   PRAPARE - Administrator, Civil Service (Medical): No    Lack of Transportation (Non-Medical): No  Physical Activity: Insufficiently Active (12/14/2021)   Exercise Vital Sign    Days of Exercise per Week: 1 day    Minutes of Exercise per Session: 10 min  Stress: No Stress Concern Present (12/14/2021)   [employer] of Occupational Health - Occupational Stress Questionnaire    Feeling of Stress : Only a little  Social Connections: Moderately Isolated (12/14/2021)   Social Connection and Isolation Panel [NHANES]    Frequency of Communication with Friends and Family: Three times a week    Frequency of Social Gatherings with Friends and Family: Once a week    Attends Religious Services: 1 to 4 times per year    Active Member of [employer] or Organizations: No    Attends Banker Meetings: Never    Marital Status: Never married    Family History  Problem Relation Age  of Onset   Asthma Brother    Arthritis Paternal Grandmother    Heart Problems Paternal Grandmother    Crohn's disease Maternal Grandmother      Current Outpatient Medications:    etonogestrel (NEXPLANON) 68 MG IMPL implant, 1 each by Subdermal route once., Disp: , Rfl:   Review of Systems  Review of Systems  Constitutional: Negative for fever, chills, weight loss, malaise/fatigue and diaphoresis.  HENT: Negative for hearing loss, ear pain, nosebleeds, congestion, sore throat, neck pain, tinnitus and ear discharge.   Eyes: Negative for blurred vision, double vision, photophobia, pain, discharge and redness.  Respiratory: Negative for cough, hemoptysis, sputum production, shortness of breath, wheezing and stridor.   Cardiovascular: Negative for chest pain, palpitations, orthopnea, claudication, leg swelling and PND.  Gastrointestinal: negative for abdominal pain. Negative for heartburn, nausea, vomiting, diarrhea, constipation,  blood in stool and melena.  Genitourinary: Negative for dysuria, urgency, frequency, hematuria and flank pain.  Musculoskeletal: Negative for myalgias, back pain, joint pain and falls.  Skin: Negative for itching and rash.  Neurological: Negative for dizziness, tingling, tremors, sensory change, speech change, focal weakness, seizures, loss of consciousness, weakness and headaches.  Endo/Heme/Allergies: Negative for environmental allergies and polydipsia. Does not bruise/bleed easily.  Psychiatric/Behavioral: Negative for depression, suicidal ideas, hallucinations, memory loss and substance abuse. The patient is not nervous/anxious and does not have insomnia.        Objective:  Blood pressure 96/61, pulse 78, height 4\' 11"  (1.499 m), weight 139 lb (63 kg).   Physical Exam  Vitals reviewed. Constitutional: She is oriented to person, place, and time. She appears well-developed and well-nourished.  HENT:  Head: Normocephalic and atraumatic.        Right Ear: External ear normal.  Left Ear: External ear normal.  Nose: Nose normal.  Mouth/Throat: Oropharynx is clear and moist.  Eyes: Conjunctivae and EOM are normal. Pupils are equal, round, and reactive to light. Right eye exhibits no discharge. Left eye exhibits no discharge. No scleral icterus.  Neck: Normal range of motion. Neck supple. No tracheal deviation present. No thyromegaly present.  Cardiovascular: Normal rate, regular rhythm, normal heart sounds and intact distal pulses.  Exam reveals no gallop and no friction rub.   No murmur heard. Respiratory: Effort normal and breath sounds normal. No respiratory distress. She has no wheezes. She has no rales. She exhibits no tenderness.  GI: Soft. Bowel sounds are normal. She exhibits no distension and no mass. There is no tenderness. There is no rebound and no guarding.  Genitourinary:  Breasts no masses skin changes or nipple changes bilaterally      Vulva is normal without lesions Vagina  is pink moist without discharge Cervix normal in appearance and pap is not done Uterus is normal size shape and contour Adnexa is negative with normal sized ovaries   Musculoskeletal: Normal range of motion. She exhibits no edema and no tenderness.  Neurological: She is alert and oriented to person, place, and time. She has normal reflexes. She displays normal reflexes. No cranial nerve deficit. She exhibits normal muscle tone. Coordination normal.  Skin: Skin is warm and dry. No rash noted. No erythema. No pallor.  Psychiatric: She has a normal mood and affect. Her behavior is normal. Judgment and thought content normal.       Medications Ordered at today's visit: No orders of the defined types were placed in this encounter.   Other orders placed at today's visit: No orders of the defined types were placed  in this encounter.     Assessment:    Normal Gyn exam.   Nexplanon removal next week Plan:    Hormone replacement therapy: none after nexplanon removal. Follow up in: 3 years.     Return for keep scheduled, 3 years yearly.

## 2021-12-14 NOTE — Addendum Note (Signed)
Addended by: Dereck Ligas on: 12/14/2021 10:18 AM   Modules accepted: Orders

## 2021-12-15 LAB — CYTOLOGY - PAP
Chlamydia: NEGATIVE
Comment: NEGATIVE
Comment: NEGATIVE
Comment: NORMAL
Diagnosis: NEGATIVE
High risk HPV: NEGATIVE
Neisseria Gonorrhea: NEGATIVE

## 2021-12-21 ENCOUNTER — Encounter: Payer: Self-pay | Admitting: Obstetrics & Gynecology

## 2021-12-21 ENCOUNTER — Ambulatory Visit: Payer: Medicaid Other | Admitting: Obstetrics & Gynecology

## 2021-12-21 VITALS — BP 103/62 | HR 95 | Ht 59.0 in | Wt 142.0 lb

## 2021-12-21 DIAGNOSIS — Z3202 Encounter for pregnancy test, result negative: Secondary | ICD-10-CM

## 2021-12-21 DIAGNOSIS — Z3046 Encounter for surveillance of implantable subdermal contraceptive: Secondary | ICD-10-CM | POA: Diagnosis not present

## 2021-12-21 LAB — POCT URINE PREGNANCY: Preg Test, Ur: NEGATIVE

## 2021-12-21 MED ORDER — ETONOGESTREL 68 MG ~~LOC~~ IMPL
68.0000 mg | DRUG_IMPLANT | Freq: Once | SUBCUTANEOUS | Status: AC
  Administered 2021-12-21: 68 mg via SUBCUTANEOUS

## 2021-12-21 NOTE — Progress Notes (Signed)
Procedure note: removal The left arm is prepped in usual sterile fashion Received 3 cc of half percent Marcaine is injected at the site of the Nexplanon insertion The Nexplanon is palpated without difficulty A small stab incision is made with an 11 blade Curved hemostats were used and the Nexplanon rod was removed without difficulty           Nexplanon Insertion Note:  G1P1001  UPT: done: negative  Pt presents desiring placement of LARC in the form of Nexplanon She has been counseled regarding various methods including OCP, nuva ring, levonorgestrel IUD, Depo Provera injections and Nexplanon.  She has chosen Nexplanon and understands its primary unpleasant side effect of irregular bleeding.   The Nexplanon device is then inserted into the previously anesthetized area. It is released without difficulty. It is palpable by both patient and myself. There is minimal bleeding.  The stab incision is reapproximated with 3 steri strips. A wrap gauze dressing is placed which patient will leave in place for 2 days or so.   Lazaro Arms, MD 12/21/2021 9:35 AM

## 2022-01-21 ENCOUNTER — Emergency Department (HOSPITAL_COMMUNITY): Payer: Medicaid Other

## 2022-01-21 ENCOUNTER — Other Ambulatory Visit: Payer: Self-pay

## 2022-01-21 ENCOUNTER — Emergency Department (HOSPITAL_COMMUNITY)
Admission: EM | Admit: 2022-01-21 | Discharge: 2022-01-21 | Disposition: A | Discharge status: Home / Self Care | Payer: Medicaid Other | Attending: Emergency Medicine | Admitting: Emergency Medicine

## 2022-01-21 ENCOUNTER — Encounter (HOSPITAL_COMMUNITY): Payer: Self-pay | Admitting: *Deleted

## 2022-01-21 DIAGNOSIS — J101 Influenza due to other identified influenza virus with other respiratory manifestations: Secondary | ICD-10-CM | POA: Insufficient documentation

## 2022-01-21 DIAGNOSIS — R000 Tachycardia, unspecified: Secondary | ICD-10-CM | POA: Insufficient documentation

## 2022-01-21 DIAGNOSIS — Z1152 Encounter for screening for COVID-19: Secondary | ICD-10-CM | POA: Insufficient documentation

## 2022-01-21 LAB — GROUP A STREP BY PCR: Group A Strep by PCR: NOT DETECTED

## 2022-01-21 LAB — RESP PANEL BY RT-PCR (RSV, FLU A&B, COVID)  RVPGX2
Influenza A by PCR: POSITIVE — AB
Influenza B by PCR: NEGATIVE
Resp Syncytial Virus by PCR: NEGATIVE
SARS Coronavirus 2 by RT PCR: NEGATIVE

## 2022-01-21 LAB — POC URINE PREG, ED: Preg Test, Ur: NEGATIVE

## 2022-01-21 MED ORDER — SODIUM CHLORIDE 0.9 % IV BOLUS
1000.0000 mL | Freq: Once | INTRAVENOUS | Status: AC
  Administered 2022-01-21: 1000 mL via INTRAVENOUS

## 2022-01-21 NOTE — ED Provider Notes (Signed)
[facility] EMERGENCY DEPARTMENT Provider Note   CSN: [id number] Arrival date & time: 01/21/22  1217     History  No chief complaint on file.   Jacqueline Koch is a 26 y.o. female presenting with a 1 day history of flulike symptoms including generalized headaches, body aches, dry cough mild sore throat, she denies fevers or diaphoresis, she has had nasal congestion and clear rhinorrhea.  She has had 1 COVID-vaccine, she has not undergone flu vaccination this fall.  Her daughter is here also with similar symptoms.  She does endorse increased shortness of breath and fatigue with exertion.  She denies chest pain, pleuritic symptoms.  She has had no swelling in her extremities.  She has taken DayQuil and does obtain some improvement in symptoms.  The history is provided by the patient.       Home Medications Prior to Admission medications   Medication Sig Start Date End Date Taking? Authorizing Provider  etonogestrel (NEXPLANON) 68 MG IMPL implant 1 each by Subdermal route once.    [provider]      Allergies    Penicillins    Review of Systems   Review of Systems  Constitutional:  Negative for chills and fever.  HENT:  Positive for congestion, rhinorrhea and sore throat. Negative for ear pain, sinus pressure, trouble swallowing and voice change.   Eyes:  Negative for discharge.  Respiratory:  Positive for cough and shortness of breath. Negative for wheezing and stridor.   Cardiovascular:  Positive for palpitations. Negative for chest pain and leg swelling.  Gastrointestinal:  Negative for abdominal pain, diarrhea, nausea and vomiting.  Genitourinary: Negative.   All other systems reviewed and are negative.   Physical Exam Updated Vital Signs BP 109/71 (BP Location: Right Arm)   Pulse (!) 120   Temp 98.4 F (36.9 C) (Oral)   Resp 19   Ht 4\' 11"  (1.499 m)   Wt 62.6 kg   SpO2 100%   BMI 27.87 kg/m  Physical Exam Vitals and nursing note reviewed.   Constitutional:      Appearance: She is well-developed.  HENT:     Head: Normocephalic and atraumatic.     Right Ear: Tympanic membrane normal.     Left Ear: Tympanic membrane normal.     Nose: Congestion and rhinorrhea present.  Eyes:     Conjunctiva/sclera: Conjunctivae normal.  Cardiovascular:     Rate and Rhythm: Regular rhythm. Tachycardia present.     Heart sounds: Normal heart sounds.  Pulmonary:     Effort: Pulmonary effort is normal.     Breath sounds: Normal breath sounds. No wheezing, rhonchi or rales.  Abdominal:     General: Bowel sounds are normal.     Palpations: Abdomen is soft.     Tenderness: There is no abdominal tenderness.  Musculoskeletal:        General: Normal range of motion.     Cervical back: Normal range of motion.  Skin:    General: Skin is warm and dry.  Neurological:     General: No focal deficit present.     Mental Status: She is alert and oriented to person, place, and time.     ED Results / Procedures / Treatments   Labs (all labs ordered are listed, but only abnormal results are displayed) Labs Reviewed  RESP PANEL BY RT-PCR (RSV, FLU A&B, COVID)  RVPGX2 - Abnormal; Notable for the following components:      Result Value  Influenza A by PCR POSITIVE (*)    All other components within normal limits  GROUP A STREP BY PCR  POC URINE PREG, ED    EKG None  Radiology DG Chest Portable 1 View  Result Date: 01/21/2022 CLINICAL DATA:  sob EXAM: PORTABLE CHEST - 1 VIEW COMPARISON:  04/05/2021 FINDINGS: Cardiac silhouette is unremarkable. No pneumothorax or pleural effusion. The lungs are clear. The visualized skeletal structures are unremarkable. IMPRESSION: No acute cardiopulmonary process. Electronically Signed   By: Layla Maw M.D.   On: 01/21/2022 13:00    Procedures Procedures    Medications Ordered in ED Medications  sodium chloride 0.9 % bolus 1,000 mL (0 mLs Intravenous Stopped 01/21/22 1439)    ED Course/ Medical  Decision Making/ A&P                           Medical Decision Making Patient presenting with flulike symptoms, positive exposure with daughter who also presents with similar symptoms and as well tested positive for influenza A.  She is in no respiratory distress here, normal vital signs, although she is tachycardic.  She was given a saline bolus of fluids and she also tolerated p.o. intake while here.  She was encouraged to rest, increase fluids, she is currently taking NyQuil for symptom relief, encouraged she may continue this medication.  As needed follow-up anticipated.  Strict return precautions were outlined.  Amount and/or Complexity of Data Reviewed Labs: ordered.    Details: Positive for influenza A. Radiology: ordered and independent interpretation performed.    Details: Reviewed, chest x-ray is clear.  Agree with interpretation.  Risk OTC drugs.           Final Clinical Impression(s) / ED Diagnoses Final diagnoses:  Influenza A    Rx / DC Orders ED Discharge Orders     None         Victoriano Lain 01/21/22 1603    Eber Hong, MD 01/29/22 [phone]

## 2022-01-21 NOTE — Discharge Instructions (Signed)
Rest make sure you are drinking plenty of fluids.  You may continue taking your NyQuil for symptom relief.  Get rechecked for any worsening symptoms including weakness or increased shortness of breath.

## 2022-01-21 NOTE — ED Triage Notes (Signed)
Pt c/o headaches, body aches, cough, sore throat that started last night. Denies fever, nasal congestion.

## 2022-04-02 ENCOUNTER — Emergency Department (HOSPITAL_COMMUNITY)
Admission: EM | Admit: 2022-04-02 | Discharge: 2022-04-02 | Disposition: A | Discharge status: Home / Self Care | Payer: BLUE CROSS/BLUE SHIELD | Attending: Emergency Medicine | Admitting: Emergency Medicine

## 2022-04-02 ENCOUNTER — Other Ambulatory Visit: Payer: Self-pay

## 2022-04-02 ENCOUNTER — Encounter (HOSPITAL_COMMUNITY): Payer: Self-pay | Admitting: *Deleted

## 2022-04-02 DIAGNOSIS — U071 COVID-19: Secondary | ICD-10-CM | POA: Diagnosis not present

## 2022-04-02 DIAGNOSIS — M791 Myalgia, unspecified site: Secondary | ICD-10-CM | POA: Diagnosis present

## 2022-04-02 LAB — RESP PANEL BY RT-PCR (RSV, FLU A&B, COVID)  RVPGX2
Influenza A by PCR: NEGATIVE
Influenza B by PCR: NEGATIVE
Resp Syncytial Virus by PCR: NEGATIVE
SARS Coronavirus 2 by RT PCR: POSITIVE — AB

## 2022-04-02 NOTE — ED Triage Notes (Signed)
Pt c/o sore throat with cough and body aches x 3 days; pt states she took a covid test at home and it was positve; pt states she needs a note for work

## 2022-04-02 NOTE — Discharge Instructions (Signed)
You have COVID, you can go back to work after 5 days from symptom onset.  Work note provided.  Take Tylenol Motrin as needed for headache, body aches and fever.

## 2022-04-02 NOTE — ED Provider Notes (Signed)
[facility] Provider Note   CSN: [id number] Arrival date & time: 04/02/22  1512     History  Chief Complaint  Patient presents with   Generalized Body Aches    Jacqueline Koch is a 27 y.o. female.  HPI   Presents to the emergency department due to generalized bodyaches.  Started few days ago, they are constant.  Associated with fevers and chills, took Tylenol prior to arrival.  States she took a home COVID test which was positive, states her work requires a work note prompting today's ED visit.  Denies any chest pain shortness of breath.  Home Medications Prior to Admission medications   Medication Sig Start Date End Date Taking? Authorizing Provider  etonogestrel (NEXPLANON) 68 MG IMPL implant 1 each by Subdermal route once.    [provider]      Allergies    Penicillins    Review of Systems   Review of Systems  Physical Exam Updated Vital Signs BP 108/72 (BP Location: Right Arm)   Pulse 82   Temp 98.6 F (37 C) (Oral)   Resp 18   Ht '4\' 11"'$  (1.499 m)   Wt 62.6 kg   SpO2 100%   BMI 27.87 kg/m  Physical Exam Vitals and nursing note reviewed.  Constitutional:      General: She is not in acute distress.    Appearance: She is well-developed.  HENT:     Head: Normocephalic and atraumatic.  Eyes:     Conjunctiva/sclera: Conjunctivae normal.  Cardiovascular:     Rate and Rhythm: Normal rate and regular rhythm.     Heart sounds: No murmur heard. Pulmonary:     Effort: Pulmonary effort is normal. No respiratory distress.     Breath sounds: Normal breath sounds.  Abdominal:     Palpations: Abdomen is soft.     Tenderness: There is no abdominal tenderness.  Musculoskeletal:        General: No swelling.     Cervical back: Neck supple.  Skin:    General: Skin is warm and dry.     Capillary Refill: Capillary refill takes less than 2 seconds.  Neurological:     Mental Status: She is alert.  Psychiatric:         Mood and Affect: Mood normal.     ED Results / Procedures / Treatments   Labs (all labs ordered are listed, but only abnormal results are displayed) Labs Reviewed  RESP PANEL BY RT-PCR (RSV, FLU A&B, COVID)  RVPGX2    EKG None  Radiology No results found.  Procedures Procedures    Medications Ordered in ED Medications - No data to display  ED Course/ Medical Decision Making/ A&P                             Medical Decision Making  Patient presents due to positive home COVID test.  On exam she is well-appearing, lungs are clear to auscultation and she is not hypoxic.  Do not think chest x-ray is indicated.  Repeat viral panel was ordered from triage given patient at home test will provide work note and have her do supportive care at home.  Return precautions discussed, stable for outpatient follow-up at this time.        Final Clinical Impression(s) / ED Diagnoses Final diagnoses:  [id number]    Rx / DC Orders ED Discharge Orders  None         Sherrill Raring, [place] 04/02/22 1643    Noemi Chapel, MD 04/02/22 [phone]

## 2022-04-05 ENCOUNTER — Encounter: Payer: Self-pay | Admitting: Radiology

## 2022-06-02 IMAGING — CT CT RENAL STONE PROTOCOL
2 of 4 series · 16 of 46 positions shown, 18 images · non-contrast
Comparison: None.

CLINICAL DATA: Right lower quadrant pain for 2 hours. Nausea
without vomiting. Denies hematuria.

EXAM:
CT ABDOMEN AND PELVIS WITHOUT CONTRAST
TECHNIQUE: Multidetector CT imaging of the abdomen and pelvis was performed
following the standard protocol without IV contrast.

[Series 2: axial st · axial · 0.70mm/px · z∈[+941,+1301]mm · 13 of 82 slices shown, 15 images]
[im 5/82  soft-tissue]
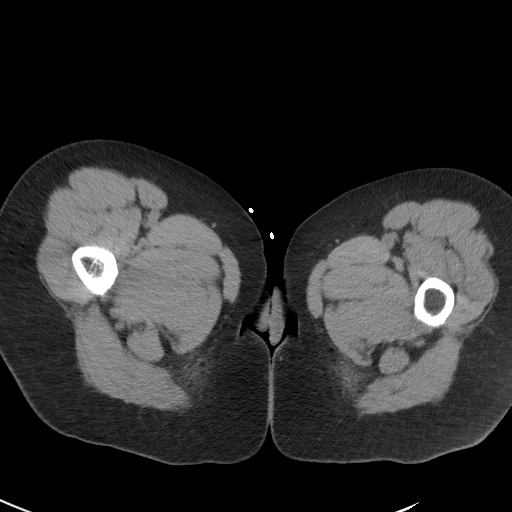
[im 5/82  bone]
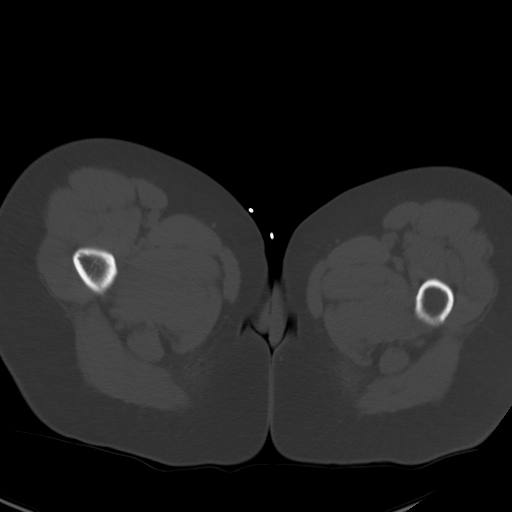
[im 13/82  soft-tissue]
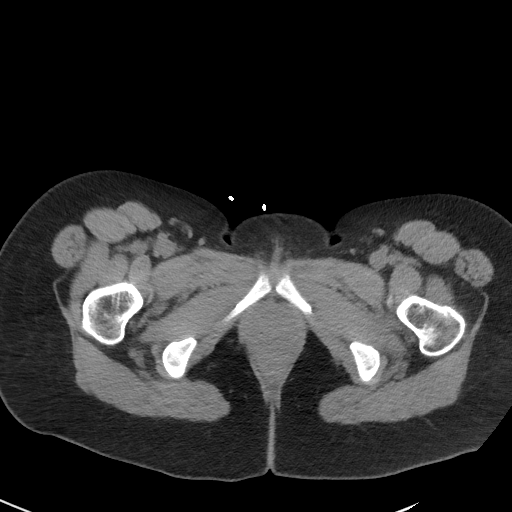
[im 18/82  soft-tissue]
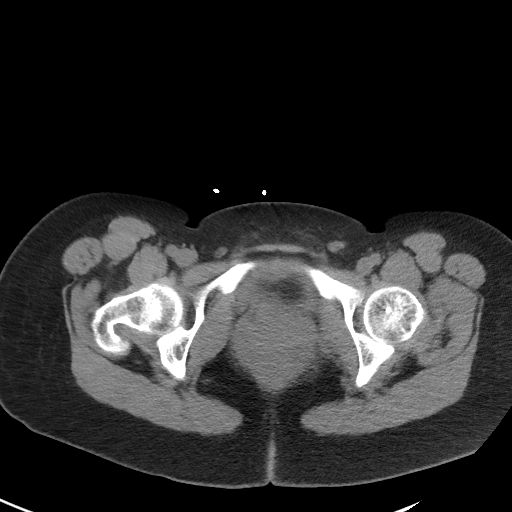
[im 22/82  soft-tissue]
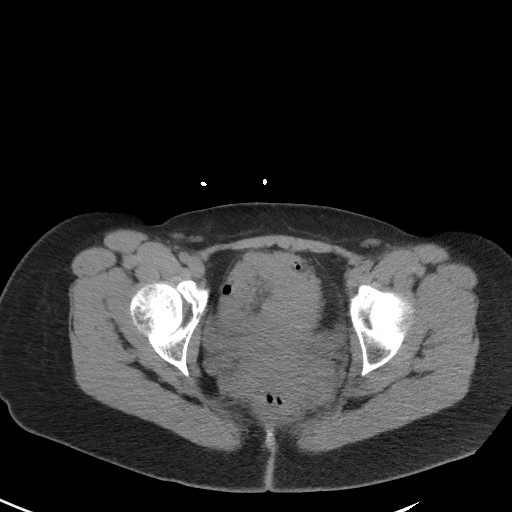
[im 30/82  soft-tissue]
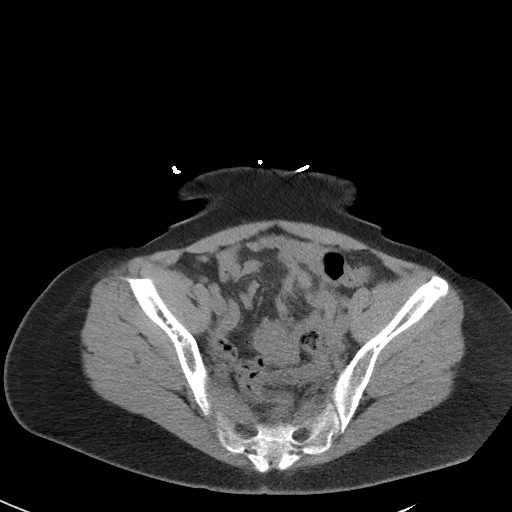
[im 35/82  soft-tissue]
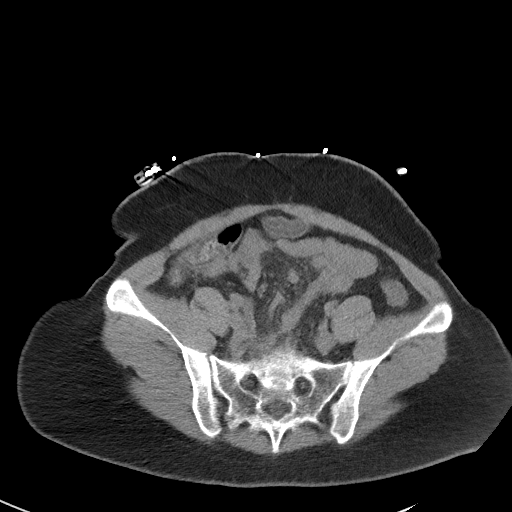
[im 43/82  soft-tissue]
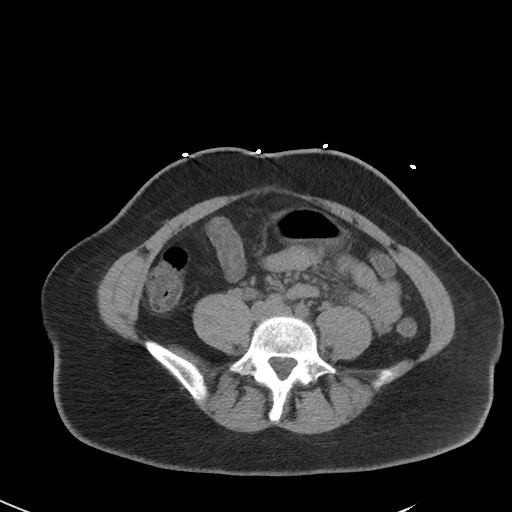
[im 47/82  soft-tissue]
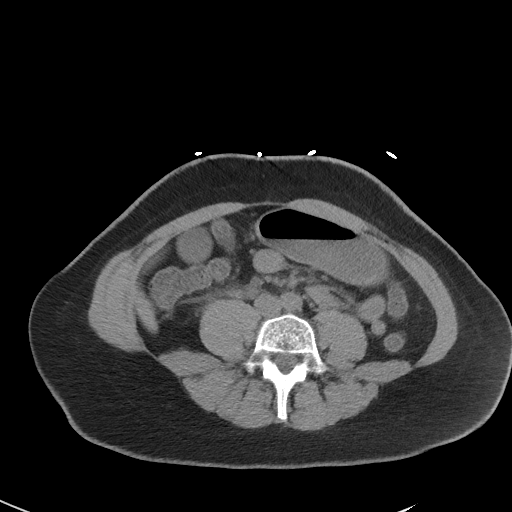
[im 52/82  soft-tissue]
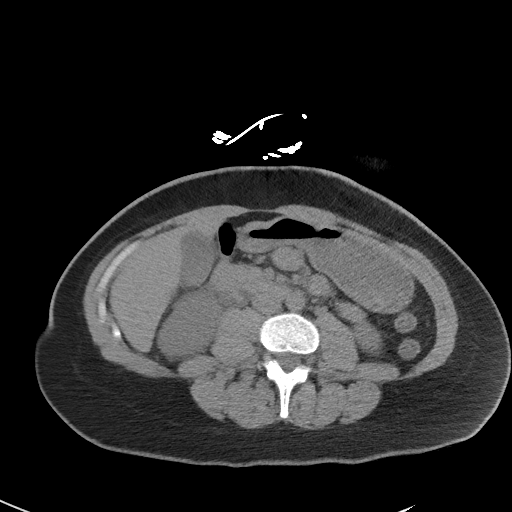
[im 52/82  bone]
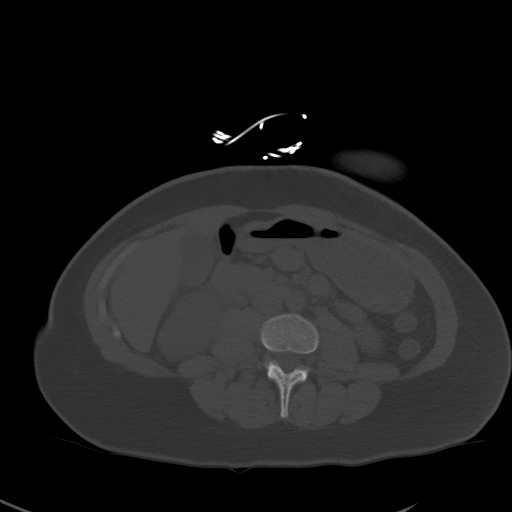
[im 60/82  soft-tissue]
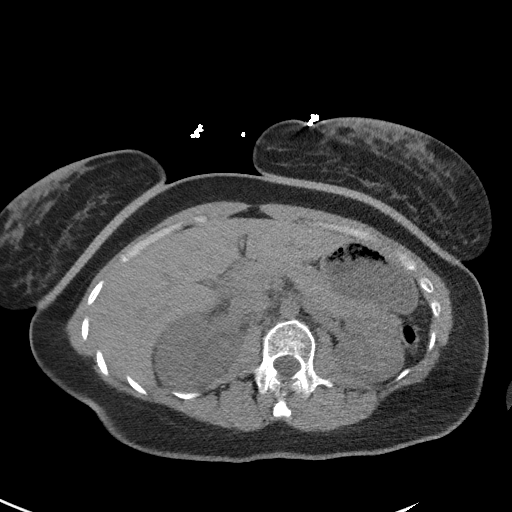
[im 64/82  soft-tissue]
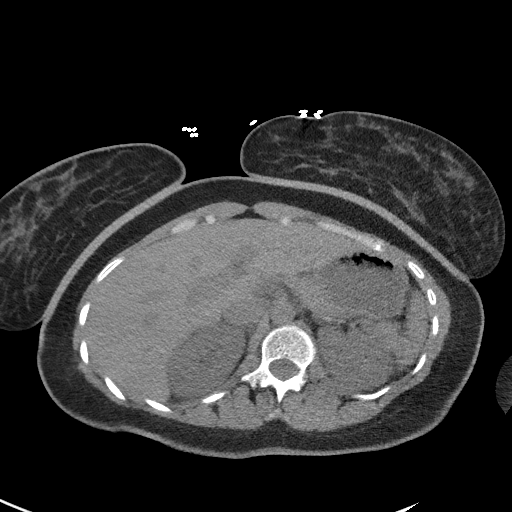
[im 69/82  soft-tissue]
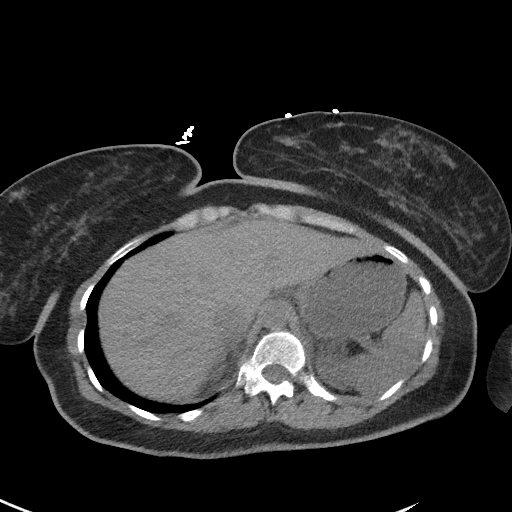
[im 77/82  soft-tissue]
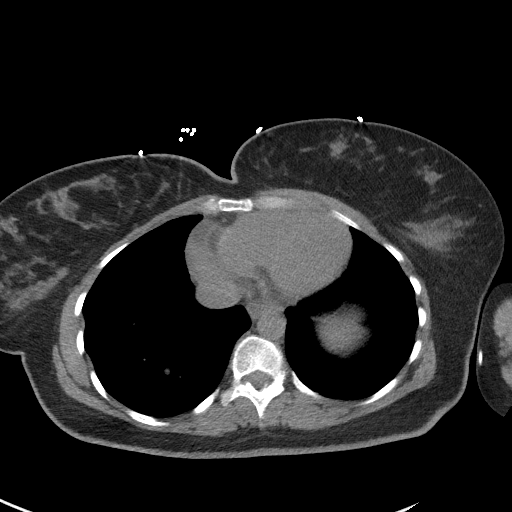

[Series 5: coronal st · coronal · 0.72mm/px · 3 of 101 slices shown]
[im 34/101  soft-tissue]
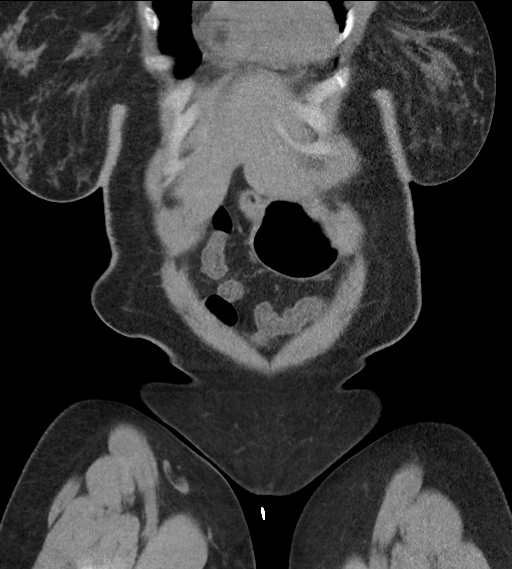
[im 45/101  soft-tissue]
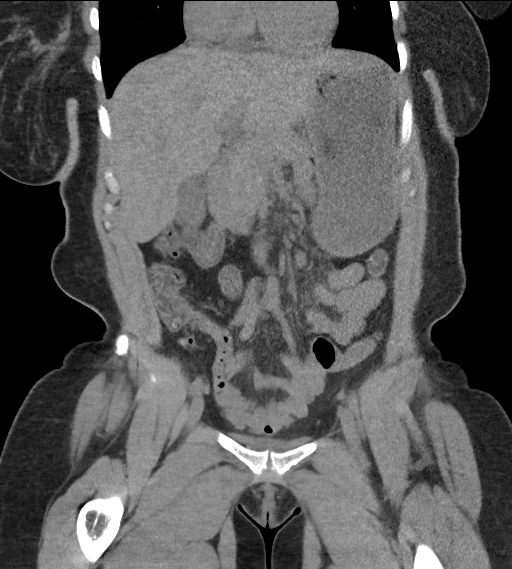
[im 56/101  soft-tissue]
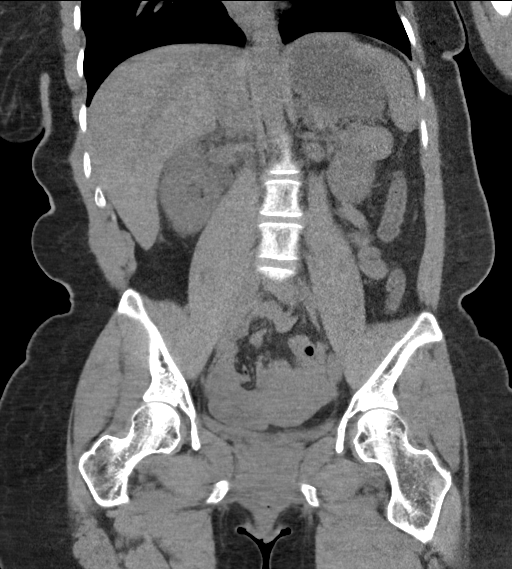

[16 of 46 positions shown; findings below may reference images not displayed]

FINDINGS: Lower chest: Clear lung bases.  Heart normal in size.

Hepatobiliary: No focal liver abnormality is seen. No gallstones,
gallbladder wall thickening, or biliary dilatation.

Pancreas: Unremarkable. No pancreatic ductal dilatation or
surrounding inflammatory changes.

Spleen: Normal in size without focal abnormality.

Adrenals/Urinary Tract: No adrenal masses.

Right kidney is mildly larger and lower in attenuation than the left
consistent with edema. There is mild right perinephric
stranding/haziness that extends along the anterior pararenal fascia
and adjacent to the proximal right ureter. Mild right
hydronephrosis. Dilation portions of the right ureter. Right ureter
is difficult to follow in the pelvis. There is a 3 mm stone adjacent
to the right posterior bladder. This appears to be in the distal
ureter. The bladder is decompressed, not well assessed. No other
evidence of a ureteral stone. No convincing right intrarenal stone.
Possible small nonobstructing stones in the mid to upper pole of the
left kidney. No renal masses. No left hydronephrosis. Normal left
ureter.

Stomach/Bowel: Stomach is within normal limits. Appendix appears
normal. No evidence of bowel wall thickening, distention, or
inflammatory changes.

Vascular/Lymphatic: No significant vascular findings are present. No
enlarged abdominal or pelvic lymph nodes.

Reproductive: Uterus not well-defined from the adjacent unenhanced
small bowel. No convincing uterine mass or abnormality. No adnexal
masses.

Other: Trace amount of pelvic free fluid.

Musculoskeletal: Mild compression deformity of T11, which appears
chronic. No other fractures. No bone lesions.
IMPRESSION: 1. Findings consistent with an obstructing distal right ureteral
stone. There is a 3 mm stone that projects in the expected location
of the distal right ureter, ureter not well-defined on this exam.
There is associated right renal edema and mild right perinephric and
periureteral stranding.
2. No other evidence of an acute abnormality within the abdomen or
pelvis.
3. Two small stones are suspected in the mid to upper pole of left
kidney.

## 2022-06-18 ENCOUNTER — Ambulatory Visit: Payer: BLUE CROSS/BLUE SHIELD

## 2022-09-25 ENCOUNTER — Telehealth: Payer: Self-pay

## 2022-09-25 NOTE — Telephone Encounter (Signed)
LVM - Informed pt of [facility] program for [facility] residents, asked pt to call back for more information.

## 2022-11-05 ENCOUNTER — Emergency Department (HOSPITAL_COMMUNITY): Payer: Medicaid Other

## 2022-11-05 ENCOUNTER — Encounter (HOSPITAL_COMMUNITY): Payer: Self-pay | Admitting: *Deleted

## 2022-11-05 ENCOUNTER — Other Ambulatory Visit: Payer: Self-pay

## 2022-11-05 ENCOUNTER — Emergency Department (HOSPITAL_COMMUNITY)
Admission: EM | Admit: 2022-11-05 | Discharge: 2022-11-05 | Disposition: A | Discharge status: Home / Self Care | Payer: Medicaid Other | Attending: Emergency Medicine | Admitting: Emergency Medicine

## 2022-11-05 DIAGNOSIS — M25572 Pain in left ankle and joints of left foot: Secondary | ICD-10-CM | POA: Diagnosis not present

## 2022-11-05 DIAGNOSIS — M25472 Effusion, left ankle: Secondary | ICD-10-CM | POA: Insufficient documentation

## 2022-11-05 DIAGNOSIS — M7989 Other specified soft tissue disorders: Secondary | ICD-10-CM | POA: Diagnosis not present

## 2022-11-05 NOTE — ED Notes (Addendum)
Pt left ankle is not  warm to the touch with minimal swelling. No redness to the area, or signs of infection. Pt states her pain level is a 4/10. Pt denies any trauma.

## 2022-11-05 NOTE — ED Triage Notes (Signed)
Pt c/o left ankle pain and swelling x 2 months. Denies known injury. Pt is able to ambulate to room, but with some difficulty.

## 2022-11-05 NOTE — Discharge Instructions (Addendum)
Avoid activities that worsen your pain.  Utilize Ace wrap when you are on your feet.  Elevate your left ankle when possible.  Take ibuprofen and Tylenol as needed for pain.  There is a telephone number below if you would like to follow-up with orthopedic surgery.

## 2022-11-05 NOTE — ED Provider Notes (Signed)
[place] [facility] AT [facility] Provider Note   CSN: [id number] Arrival date & time: 11/05/22  2952     History  Chief Complaint  Patient presents with   Ankle Pain    Jacqueline Koch is a 27 y.o. female.   Ankle Pain Patient presents for left ankle pain.  She has no chronic medical conditions.  She does not take any daily medications.  She denies any history of injury to her left ankle.  For the past 2 months, she has had intermittent pain in lateral aspect of left ankle.  Pain is worsened with weightbearing.  She will occasionally take ibuprofen.  For her job, she is on her feet throughout the day.  She will notice swelling that is worsened after prolonged periods of standing.  Pain has not recently worsened.  She is simply frustrated that it has not gotten better.  Pain is minimal at this time.  She denies any other physical complaints.     Home Medications Prior to Admission medications   Medication Sig Start Date End Date Taking? Authorizing Provider  etonogestrel (NEXPLANON) 68 MG IMPL implant 1 each by Subdermal route once.    [provider]      Allergies    Penicillins    Review of Systems   Review of Systems  Musculoskeletal:  Positive for arthralgias and joint swelling.  All other systems reviewed and are negative.   Physical Exam Updated Vital Signs BP 101/70   Pulse 74   Temp 98.1 F (36.7 C) (Oral)   Resp 14   Ht 4\' 11"  (1.499 m)   Wt 59 kg   SpO2 99%   BMI 26.26 kg/m  Physical Exam Vitals and nursing note reviewed.  Constitutional:      General: She is not in acute distress.    Appearance: Normal appearance. She is well-developed. She is not ill-appearing, toxic-appearing or diaphoretic.  HENT:     Head: Normocephalic and atraumatic.     Right Ear: External ear normal.     Left Ear: External ear normal.     Nose: Nose normal.     Mouth/Throat:     Mouth: Mucous membranes are moist.  Eyes:     Extraocular  Movements: Extraocular movements intact.     Conjunctiva/sclera: Conjunctivae normal.  Cardiovascular:     Rate and Rhythm: Normal rate and regular rhythm.  Pulmonary:     Effort: Pulmonary effort is normal. No respiratory distress.  Abdominal:     General: There is no distension.     Palpations: Abdomen is soft.  Musculoskeletal:        General: Swelling and tenderness present. No deformity. Normal range of motion.     Cervical back: Normal range of motion and neck supple.  Skin:    General: Skin is warm and dry.     Coloration: Skin is not jaundiced or pale.  Neurological:     General: No focal deficit present.     Mental Status: She is alert and oriented to person, place, and time.  Psychiatric:        Mood and Affect: Mood normal.        Behavior: Behavior normal.     ED Results / Procedures / Treatments   Labs (all labs ordered are listed, but only abnormal results are displayed) Labs Reviewed - No data to display  EKG None  Radiology DG Ankle 2 Views Left  Result Date: 11/05/2022 CLINICAL DATA:  Left ankle pain and swelling for 2 months without known injury. EXAM: LEFT ANKLE - 2 VIEW COMPARISON:  None Available. FINDINGS: There is no evidence of fracture, dislocation, or joint effusion. There is no evidence of arthropathy or other focal bone abnormality. Soft tissues are unremarkable. IMPRESSION: Negative. Electronically Signed   By: Lupita Raider M.D.   On: 11/05/2022 08:47    Procedures Procedures    Medications Ordered in ED Medications - No data to display  ED Course/ Medical Decision Making/ A&P                                 Medical Decision Making Amount and/or Complexity of Data Reviewed Radiology: ordered.   Patient presents for left ankle pain over the past 2 months.  On arrival in the ED, patient is well-appearing.  She is ambulatory without difficulty.  She does endorse some mild pain to the lateral aspect of her left ankle with weightbearing.   At rest, she does not have pain.  On exam, there is some very mild swelling to area of lateral malleolus.  There is no overlying erythema or warmth.  Left ankle range of motion is intact.  X-ray imaging shows no acute findings.  Patient was provided with Ace wrap.  She was advised to stabilize ankle when on her feet, to limit activities that worsen her pain, to elevate ankle when possible, and to follow-up with orthopedic surgery as needed.  She was discharged in good condition.        Final Clinical Impression(s) / ED Diagnoses Final diagnoses:  Acute left ankle pain    Rx / DC Orders ED Discharge Orders     None         Gloris Manchester, MD 11/05/22 [phone]

## 2023-03-26 ENCOUNTER — Other Ambulatory Visit: Payer: Self-pay

## 2023-03-26 ENCOUNTER — Ambulatory Visit
Admission: EM | Admit: 2023-03-26 | Discharge: 2023-03-26 | Disposition: A | Discharge status: Home / Self Care | Payer: Medicaid Other | Attending: Family Medicine | Admitting: Family Medicine

## 2023-03-26 ENCOUNTER — Encounter: Payer: Self-pay | Admitting: Emergency Medicine

## 2023-03-26 DIAGNOSIS — J069 Acute upper respiratory infection, unspecified: Secondary | ICD-10-CM | POA: Diagnosis not present

## 2023-03-26 LAB — POC COVID19/FLU A&B COMBO
Covid Antigen, POC: NEGATIVE
Influenza A Antigen, POC: NEGATIVE
Influenza B Antigen, POC: NEGATIVE

## 2023-03-26 MED ORDER — BENZONATATE 100 MG PO CAPS: 100.0000 mg | ORAL_CAPSULE | Freq: Three times a day (TID) | ORAL | 0 refills | Status: DC | PRN

## 2023-03-26 MED ORDER — FLUTICASONE PROPIONATE 50 MCG/ACT NA SUSP: 1.0000 | Freq: Every day | NASAL | 0 refills | Status: DC

## 2023-03-26 NOTE — ED Provider Notes (Signed)
 [facility]    CSN: [id number] Arrival date & time: 03/26/23  1305      History   Chief Complaint Chief Complaint  Patient presents with   Nasal Congestion    HPI Jacqueline Koch is a 28 y.o. female.   Patient presents today with 1 day history of chills and night sweats, shortness of breath, stuffy nose and post nasal drainage, diarrhea, and fatigue.  Also reports sore throat that is now improved.  No fever, body aches, congested cough or dry cough, chest pain or tightness, runny nose, headache, ear pain, abdominal pain, nausea/vomiting, and change in appetite.  No known sick contacts.  Has been taking Dayquil which helps with symptoms temporarily.      Past Medical History:  Diagnosis Date   Kidney stone    Nexplanon removal 06/09/2014   Syncope     Patient Active Problem List   Diagnosis Date Noted   Closed nondisplaced fracture of shaft of left clavicle 03/30/2020   Nondisplaced fracture of body of left scapula with routine healing 03/30/2020   Nexplanon insertion 08/22/2015   Susceptible to varicella (non-immune), currently pregnant 12/13/2014   Marijuana use 12/08/2014   Chlamydia infection 01/20/2013   Adolescent scoliosis 12/01/2012   Abnormal vision screen 12/01/2012   Hearing deficit 12/01/2012   Scoliosis 11/02/2010   Back pain 11/02/2010    History reviewed. No pertinent surgical history.  OB History     Gravida  1   Para  1   Term  1   Preterm  0   AB  0   Living  1      SAB  0   IAB  0   Ectopic  0   Multiple  0   Live Births  1            Home Medications    Prior to Admission medications   Medication Sig Start Date End Date Taking? Authorizing Provider  benzonatate (TESSALON) 100 MG capsule Take 1 capsule (100 mg total) by mouth 3 (three) times daily as needed for cough. Do not take with alcohol or while operating or driving heavy machinery 03/20/06  Yes Cathlean Marseilles A, NP  fluticasone (FLONASE) 50 MCG/ACT  nasal spray Place 1 spray into both nostrils daily. 03/26/23  Yes Valentino Nose, NP  etonogestrel (NEXPLANON) 68 MG IMPL implant 1 each by Subdermal route once.    [provider]    Family History Family History  Problem Relation Age of Onset   Asthma Brother    Arthritis Paternal Grandmother    Heart Problems Paternal Grandmother    Crohn's disease Maternal Grandmother     Social History Social History   Tobacco Use   Smoking status: Light Smoker    Types: Cigarettes, Cigars   Smokeless tobacco: Never   Tobacco comments:    1 cigarette daily   Vaping Use   Vaping status: Never Used  Substance Use Topics   Alcohol use: Yes    Alcohol/week: 0.0 standard drinks of alcohol    Comment: social   Drug use: No     Allergies   Penicillins   Review of Systems Review of Systems Per HPI  Physical Exam Triage Vital Signs ED Triage Vitals  Encounter Vitals Group     BP 03/26/23 1319 100/67     Systolic BP Percentile --      Diastolic BP Percentile --      Pulse Rate 03/26/23 1319 100  Resp 03/26/23 1319 20     Temp 03/26/23 1319 98.4 F (36.9 C)     Temp Source 03/26/23 1319 Oral     SpO2 03/26/23 1319 97 %     Weight --      Height --      Head Circumference --      Peak Flow --      Pain Score 03/26/23 1318 0     Pain Loc --      Pain Education --      Exclude from Growth Chart --    No data found.  Updated Vital Signs BP 100/67 (BP Location: Right Arm)   Pulse 100   Temp 98.4 F (36.9 C) (Oral)   Resp 20   SpO2 97%   Visual Acuity Right Eye Distance:   Left Eye Distance:   Bilateral Distance:    Right Eye Near:   Left Eye Near:    Bilateral Near:     Physical Exam Vitals and nursing note reviewed.  Constitutional:      General: She is not in acute distress.    Appearance: Normal appearance. She is not ill-appearing or toxic-appearing.  HENT:     Head: Normocephalic and atraumatic.     Right Ear: Tympanic membrane, ear  canal and external ear normal.     Left Ear: Tympanic membrane, ear canal and external ear normal.     Nose: No congestion or rhinorrhea.     Mouth/Throat:     Mouth: Mucous membranes are moist.     Pharynx: Oropharynx is clear. Posterior oropharyngeal erythema present. No oropharyngeal exudate.  Eyes:     General: No scleral icterus.    Extraocular Movements: Extraocular movements intact.  Cardiovascular:     Rate and Rhythm: Normal rate and regular rhythm.  Pulmonary:     Effort: Pulmonary effort is normal. No respiratory distress.     Breath sounds: Normal breath sounds. No wheezing, rhonchi or rales.  Musculoskeletal:     Cervical back: Normal range of motion and neck supple.  Lymphadenopathy:     Cervical: No cervical adenopathy.  Skin:    General: Skin is warm and dry.     Coloration: Skin is not jaundiced or pale.     Findings: No erythema or rash.  Neurological:     Mental Status: She is alert and oriented to person, place, and time.  Psychiatric:        Behavior: Behavior is cooperative.      UC Treatments / Results  Labs (all labs ordered are listed, but only abnormal results are displayed) Labs Reviewed  POC COVID19/FLU A&B COMBO    EKG   Radiology No results found.  Procedures Procedures (including critical care time)  Medications Ordered in UC Medications - No data to display  Initial Impression / Assessment and Plan / UC Course  I have reviewed the triage vital signs and the nursing notes.  Pertinent labs & imaging results that were available during my care of the patient were reviewed by me and considered in my medical decision making (see chart for details).   Patient is well-appearing, normotensive, afebrile, not tachycardic, not tachypneic, oxygenating well on room air.    1. Viral URI Suspect viral etiology Vitals and exam are reassuring today COVID-19 and influenza test are negative Supportive care discussed with patient Start Flonase  and tessalon perles if she develops cough Return and ER precautions discussed   The patient was given the opportunity  to ask questions.  All questions answered to their satisfaction.  The patient is in agreement to this plan.    Final Clinical Impressions(s) / UC Diagnoses   Final diagnoses:  Viral URI     Discharge Instructions      You have a viral upper respiratory infection.  Symptoms should improve over the next week to 10 days.  If you develop chest pain or shortness of breath, go to the emergency room.  COVID-19 and influenza tests are negative today.  Some things that can make you feel better are: - Increased rest - Increasing fluid with water/sugar free electrolytes - Acetaminophen and ibuprofen as needed for fever/pain - Salt water gargling, chloraseptic spray and throat lozenges - OTC guaifenesin (Mucinex) 600 mg twice daily for congestion - Saline sinus flushes or a neti pot - Humidifying the air - Flonase nasal spray as needed for post nasal drainage -Tessalon Perles every 8 hours as needed for dry cough      ED Prescriptions     Medication Sig Dispense Auth. Provider   fluticasone (FLONASE) 50 MCG/ACT nasal spray Place 1 spray into both nostrils daily. 16 g Cathlean Marseilles A, NP   benzonatate (TESSALON) 100 MG capsule Take 1 capsule (100 mg total) by mouth 3 (three) times daily as needed for cough. Do not take with alcohol or while operating or driving heavy machinery 21 capsule Valentino Nose, NP      PDMP not reviewed this encounter.   Valentino Nose, NP 03/26/23 [phone]

## 2023-03-26 NOTE — Discharge Instructions (Signed)
 You have a viral upper respiratory infection.  Symptoms should improve over the next week to 10 days.  If you develop chest pain or shortness of breath, go to the emergency room.  COVID-19 and influenza tests are negative today.  Some things that can make you feel better are: - Increased rest - Increasing fluid with water/sugar free electrolytes - Acetaminophen and ibuprofen as needed for fever/pain - Salt water gargling, chloraseptic spray and throat lozenges - OTC guaifenesin (Mucinex) 600 mg twice daily for congestion - Saline sinus flushes or a neti pot - Humidifying the air - Flonase nasal spray as needed for post nasal drainage -Tessalon Perles every 8 hours as needed for dry cough

## 2023-03-26 NOTE — ED Triage Notes (Signed)
 Pt reports nasal congestion, chills since last night. Denies any known exposures or fevers.

## 2023-04-03 NOTE — Patient Instructions (Signed)

## 2023-04-03 NOTE — Progress Notes (Unsigned)
   New Patient Office Visit   Subjective   Patient ID: Jacqueline Koch, female    DOB: 02-13-1995  Age: 28 y.o. MRN: [id number]  CC: No chief complaint on file.   HPI Jacqueline Koch 28 year old female, presents to establish care. She  has a past medical history of Kidney stone, Nexplanon removal (06/09/2014), and Syncope.  HPI    Outpatient Encounter Medications as of 04/04/2023  Medication Sig   benzonatate (TESSALON) 100 MG capsule Take 1 capsule (100 mg total) by mouth 3 (three) times daily as needed for cough. Do not take with alcohol or while operating or driving heavy machinery   etonogestrel (NEXPLANON) 68 MG IMPL implant 1 each by Subdermal route once.   fluticasone (FLONASE) 50 MCG/ACT nasal spray Place 1 spray into both nostrils daily.   No facility-administered encounter medications on file as of 04/04/2023.    No past surgical history on file.  ROS    Objective    There were no vitals taken for this visit.  Physical Exam    Assessment & Plan:  There are no diagnoses linked to this encounter.  No follow-ups on file.   Jacqueline Lederer Newman Nip, FNP

## 2023-04-04 ENCOUNTER — Encounter: Payer: Medicaid Other | Admitting: Family Medicine

## 2023-04-04 DIAGNOSIS — Z136 Encounter for screening for cardiovascular disorders: Secondary | ICD-10-CM

## 2023-04-04 DIAGNOSIS — Z1159 Encounter for screening for other viral diseases: Secondary | ICD-10-CM

## 2023-04-04 DIAGNOSIS — E559 Vitamin D deficiency, unspecified: Secondary | ICD-10-CM

## 2023-04-04 DIAGNOSIS — E038 Other specified hypothyroidism: Secondary | ICD-10-CM

## 2023-04-04 DIAGNOSIS — Z114 Encounter for screening for human immunodeficiency virus [HIV]: Secondary | ICD-10-CM

## 2023-04-04 DIAGNOSIS — Z113 Encounter for screening for infections with a predominantly sexual mode of transmission: Secondary | ICD-10-CM

## 2023-04-04 DIAGNOSIS — R7301 Impaired fasting glucose: Secondary | ICD-10-CM

## 2023-04-04 NOTE — Progress Notes (Signed)
 No show

## 2023-04-16 ENCOUNTER — Encounter: Payer: Self-pay | Admitting: Family Medicine

## 2023-06-13 ENCOUNTER — Ambulatory Visit
Admission: EM | Admit: 2023-06-13 | Discharge: 2023-06-13 | Disposition: A | Discharge status: Home / Self Care | Attending: Nurse Practitioner | Admitting: Nurse Practitioner

## 2023-06-13 DIAGNOSIS — R8281 Pyuria: Secondary | ICD-10-CM | POA: Insufficient documentation

## 2023-06-13 DIAGNOSIS — N898 Other specified noninflammatory disorders of vagina: Secondary | ICD-10-CM | POA: Diagnosis not present

## 2023-06-13 DIAGNOSIS — Z113 Encounter for screening for infections with a predominantly sexual mode of transmission: Secondary | ICD-10-CM | POA: Insufficient documentation

## 2023-06-13 LAB — POCT URINALYSIS DIP (MANUAL ENTRY)
Bilirubin, UA: NEGATIVE
Blood, UA: NEGATIVE
Glucose, UA: NEGATIVE mg/dL
Ketones, POC UA: NEGATIVE mg/dL
Nitrite, UA: NEGATIVE
Protein Ur, POC: 30 mg/dL — AB
Spec Grav, UA: 1.015 (ref 1.010–1.025)
Urobilinogen, UA: 1 U/dL
pH, UA: 8.5 — AB (ref 5.0–8.0)

## 2023-06-13 LAB — POCT URINE PREGNANCY: Preg Test, Ur: NEGATIVE

## 2023-06-13 MED ORDER — METRONIDAZOLE 500 MG PO TABS: 500.0000 mg | ORAL_TABLET | Freq: Two times a day (BID) | ORAL | 0 refills | Status: DC

## 2023-06-13 MED ORDER — FLUCONAZOLE 150 MG PO TABS: 150.0000 mg | ORAL_TABLET | ORAL | 0 refills | Status: AC

## 2023-06-13 NOTE — ED Provider Notes (Signed)
 [facility]    CSN: [id number] Arrival date & time: 06/13/23  1219      History   Chief Complaint No chief complaint on file.   HPI Jacqueline Koch is a 28 y.o. female.   Jacqueline Koch is a 28 year old female who presents with a complaint of vaginal discharge for the past week. She describes the discharge as white, thin, and having an odor that is not fishy. She denies any associated vaginal itching, irritation, dysuria, urinary frequency, genital sores, low back pain, nausea, vomiting, or fevers. The patient does not have menstrual periods due to a Nexplanon  implant. She is sexually active with one female partner and does not use condoms. She reports no other sexual partners within the past three months. The patient is requesting comprehensive STD testing today. She has a past history of gonorrhea and trichomonas infections.  The following portions of the patient's history were reviewed and updated as appropriate: allergies, current medications, past family history, past medical history, past social history, past surgical history, and problem list.      Past Medical History:  Diagnosis Date   Kidney stone    Nexplanon  removal 06/09/2014   Syncope     Patient Active Problem List   Diagnosis Date Noted   Closed nondisplaced fracture of shaft of left clavicle 03/30/2020   Nondisplaced fracture of body of left scapula with routine healing 03/30/2020   Nexplanon  insertion 08/22/2015   Susceptible to varicella (non-immune), currently pregnant 12/13/2014   Marijuana use 12/08/2014   Chlamydia infection 01/20/2013   Adolescent scoliosis 12/01/2012   Abnormal vision screen 12/01/2012   Hearing deficit 12/01/2012   Scoliosis 11/02/2010   Back pain 11/02/2010    History reviewed. No pertinent surgical history.  OB History     Gravida  1   Para  1   Term  1   Preterm  0   AB  0   Living  1      SAB  0   IAB  0   Ectopic  0   Multiple  0   Live  Births  1            Home Medications    Prior to Admission medications   Medication Sig Start Date End Date Taking? Authorizing Provider  fluconazole  (DIFLUCAN ) 150 MG tablet Take 1 tablet (150 mg total) by mouth every 3 (three) days for 2 doses. 06/13/23 06/17/23 Yes Maryruth Sol, FNP  metroNIDAZOLE  (FLAGYL ) 500 MG tablet Take 1 tablet (500 mg total) by mouth 2 (two) times daily. 06/13/23  Yes Shelby Anderle, FNP  benzonatate  (TESSALON ) 100 MG capsule Take 1 capsule (100 mg total) by mouth 3 (three) times daily as needed for cough. Do not take with alcohol or while operating or driving heavy machinery 0/86/57   Wilhemena Harbour, NP  etonogestrel  (NEXPLANON ) 68 MG IMPL implant 1 each by Subdermal route once.    [provider]  fluticasone  (FLONASE ) 50 MCG/ACT nasal spray Place 1 spray into both nostrils daily. 03/26/23   Wilhemena Harbour, NP    Family History Family History  Problem Relation Age of Onset   Asthma Brother    Arthritis Paternal Grandmother    Heart Problems Paternal Grandmother    Crohn's disease Maternal Grandmother     Social History Social History   Tobacco Use   Smoking status: Light Smoker    Types: Cigarettes, Cigars   Smokeless tobacco: Never   Tobacco comments:  1 cigarette daily   Vaping Use   Vaping status: Never Used  Substance Use Topics   Alcohol use: Yes    Alcohol/week: 0.0 standard drinks of alcohol    Comment: social   Drug use: No     Allergies   Penicillins   Review of Systems Review of Systems  Constitutional:  Negative for fever.  Gastrointestinal:  Negative for abdominal pain, nausea and vomiting.  Genitourinary:  Positive for vaginal discharge. Negative for dysuria, frequency, genital sores, hematuria, urgency and vaginal pain.  Musculoskeletal:  Negative for back pain.  All other systems reviewed and are negative.    Physical Exam Triage Vital Signs ED Triage Vitals  Encounter Vitals Group      BP 06/13/23 1229 106/66     Systolic BP Percentile --      Diastolic BP Percentile --      Pulse Rate 06/13/23 1229 97     Resp 06/13/23 1229 20     Temp 06/13/23 1229 98.6 F (37 C)     Temp Source 06/13/23 1229 Oral     SpO2 06/13/23 1229 97 %     Weight --      Height --      Head Circumference --      Peak Flow --      Pain Score 06/13/23 1230 0     Pain Loc --      Pain Education --      Exclude from Growth Chart --    No data found.  Updated Vital Signs BP 106/66 (BP Location: Right Arm)   Pulse 97   Temp 98.6 F (37 C) (Oral)   Resp 20   SpO2 97%   Visual Acuity Right Eye Distance:   Left Eye Distance:   Bilateral Distance:    Right Eye Near:   Left Eye Near:    Bilateral Near:     Physical Exam Vitals and nursing note reviewed.  Constitutional:      General: She is not in acute distress.    Appearance: Normal appearance. She is well-developed. She is not ill-appearing, toxic-appearing or diaphoretic.  HENT:     Head: Normocephalic and atraumatic.     Nose: Nose normal.     Mouth/Throat:     Mouth: Mucous membranes are moist.  Eyes:     Conjunctiva/sclera: Conjunctivae normal.  Cardiovascular:     Rate and Rhythm: Normal rate and regular rhythm.     Heart sounds: No murmur heard. Pulmonary:     Effort: Pulmonary effort is normal. No respiratory distress.     Breath sounds: Normal breath sounds.  Abdominal:     Palpations: Abdomen is soft.     Tenderness: There is no abdominal tenderness.  Genitourinary:    Comments: Deferred; patient performed self-swab for Aptima testing  Musculoskeletal:        General: No swelling. Normal range of motion.     Cervical back: Normal range of motion and neck supple.  Skin:    General: Skin is warm and dry.     Capillary Refill: Capillary refill takes less than 2 seconds.  Neurological:     General: No focal deficit present.     Mental Status: She is alert and oriented to person, place, and time.   Psychiatric:        Mood and Affect: Mood normal.        Behavior: Behavior normal.      UC Treatments / Results  Labs (all labs ordered are listed, but only abnormal results are displayed) Labs Reviewed  POCT URINALYSIS DIP (MANUAL ENTRY) - Abnormal; Notable for the following components:      Result Value   Clarity, UA cloudy (*)    pH, UA 8.5 (*)    Protein Ur, POC =30 (*)    Leukocytes, UA Small (1+) (*)    All other components within normal limits  URINE CULTURE  HIV ANTIBODY (ROUTINE TESTING W REFLEX)  RPR  HEPATITIS B SURFACE ANTIGEN  HCV AB W REFLEX TO QUANT PCR  POCT URINE PREGNANCY  CERVICOVAGINAL ANCILLARY ONLY    EKG   Radiology No results found.  Procedures Procedures (including critical care time)  Medications Ordered in UC Medications - No data to display  Initial Impression / Assessment and Plan / UC Course  I have reviewed the triage vital signs and the nursing notes.  Pertinent labs & imaging results that were available during my care of the patient were reviewed by me and considered in my medical decision making (see chart for details).    28 year old female presenting with vaginal discharge. She is afebrile and nontoxic. Physical exam is as documented above; GU exam was deferred. The patient completed a self-collected vaginal swab for Aptima testing. Urine pregnancy test is negative. Urinalysis shows cloudy urine with a small amount of leukocytes, but no blood or nitrites. Comprehensive testing for bacteria, yeast, gonorrhea, chlamydia, trichomonas, HIV, syphilis, and hepatitis has been sent and is currently pending. She was advised to abstain from sexual activity until all results are back, treatment has been completed, and her symptoms have fully resolved. A urine culture was also sent due to the cloudy appearance and presence of leukocytes in her urinalysis. Empiric antibiotics for UTI are being withheld at this time due to the absence of urinary  symptoms. However, empiric treatment with Flagyl  and Diflucan  was initiated for likely bacterial vaginosis and/or yeast infection. Further management will be guided by the results of pending labs and culture.  The following portions of the patient's history were reviewed and updated as appropriate: allergies, current medications, past family history, past medical history, past social history, past surgical history, and problem list. Final Clinical Impressions(s) / UC Diagnoses   Final diagnoses:  Vaginal discharge  Screen for STD (sexually transmitted disease)  Pyuria     Discharge Instructions      Testing for bacteria, yeast, gonorrhea, chlamydia, trichomonas, HIV, syphilis, and hepatitis is currently pending. It is very important that you avoid all sexual activity until your test results have returned, your treatment is complete, and your current symptoms have fully resolved to prevent further irritation or possible transmission.  Because you are experiencing symptoms today, you have been given medications that treat both bacterial and yeast infections. Please take all medications exactly as prescribed, even if you begin to feel better before finishing the full course. Stopping early can lead to incomplete treatment and possible return of symptoms.  Your urine was noted to be cloudy and showed a small amount of white blood cells, which could suggest a urinary tract infection. However, because you are not experiencing any UTI-related symptoms, we will not start antibiotics at this time. A urine culture has been sent to check for any bacteria in the urinary tract, and we will follow up if treatment becomes necessary.  You will only be contacted if any of your test results come back positive. Otherwise, you can view all of your results by logging into  your MyChart account.   ED Prescriptions     Medication Sig Dispense Auth. Provider   metroNIDAZOLE  (FLAGYL ) 500 MG tablet Take 1 tablet  (500 mg total) by mouth 2 (two) times daily. 14 tablet Maryruth Sol, FNP   fluconazole  (DIFLUCAN ) 150 MG tablet Take 1 tablet (150 mg total) by mouth every 3 (three) days for 2 doses. 2 tablet Maryruth Sol, FNP      PDMP not reviewed this encounter.   Maryruth Sol, [place] 06/13/23 1322

## 2023-06-13 NOTE — Discharge Instructions (Addendum)
 Testing for bacteria, yeast, gonorrhea, chlamydia, trichomonas, HIV, syphilis, and hepatitis is currently pending. It is very important that you avoid all sexual activity until your test results have returned, your treatment is complete, and your current symptoms have fully resolved to prevent further irritation or possible transmission.  Because you are experiencing symptoms today, you have been given medications that treat both bacterial and yeast infections. Please take all medications exactly as prescribed, even if you begin to feel better before finishing the full course. Stopping early can lead to incomplete treatment and possible return of symptoms.  Your urine was noted to be cloudy and showed a small amount of white blood cells, which could suggest a urinary tract infection. However, because you are not experiencing any UTI-related symptoms, we will not start antibiotics at this time. A urine culture has been sent to check for any bacteria in the urinary tract, and we will follow up if treatment becomes necessary.  You will only be contacted if any of your test results come back positive. Otherwise, you can view all of your results by logging into your MyChart account.

## 2023-06-13 NOTE — ED Triage Notes (Signed)
 Pt reports needing STD check, both swab and blood work,  and possible yeast infection, abnormal discharge, odor x 1 week

## 2023-06-14 LAB — CERVICOVAGINAL ANCILLARY ONLY
Bacterial Vaginitis (gardnerella): POSITIVE — AB
Candida Glabrata: NEGATIVE
Candida Vaginitis: NEGATIVE
Chlamydia: NEGATIVE
Comment: NEGATIVE
Comment: NEGATIVE
Comment: NEGATIVE
Comment: NEGATIVE
Comment: NEGATIVE
Comment: NORMAL
Neisseria Gonorrhea: NEGATIVE
Trichomonas: POSITIVE — AB

## 2023-06-14 LAB — URINE CULTURE: Culture: NO GROWTH

## 2023-06-14 LAB — HEPATITIS B SURFACE ANTIGEN: Hepatitis B Surface Ag: NEGATIVE

## 2023-06-14 LAB — HCV INTERPRETATION

## 2023-06-14 LAB — HIV ANTIBODY (ROUTINE TESTING W REFLEX): HIV Screen 4th Generation wRfx: NONREACTIVE

## 2023-06-14 LAB — RPR: RPR Ser Ql: NONREACTIVE

## 2023-06-14 LAB — HCV AB W REFLEX TO QUANT PCR: HCV Ab: NONREACTIVE

## 2023-06-24 ENCOUNTER — Other Ambulatory Visit: Payer: Self-pay

## 2023-06-24 ENCOUNTER — Emergency Department (HOSPITAL_COMMUNITY)
Admission: EM | Admit: 2023-06-24 | Discharge: 2023-06-24 | Disposition: A | Discharge status: Home / Self Care | Attending: Emergency Medicine | Admitting: Emergency Medicine

## 2023-06-24 ENCOUNTER — Encounter (HOSPITAL_COMMUNITY): Payer: Self-pay | Admitting: *Deleted

## 2023-06-24 DIAGNOSIS — X58XXXA Exposure to other specified factors, initial encounter: Secondary | ICD-10-CM | POA: Diagnosis not present

## 2023-06-24 DIAGNOSIS — S79911A Unspecified injury of right hip, initial encounter: Secondary | ICD-10-CM | POA: Diagnosis present

## 2023-06-24 DIAGNOSIS — S76011A Strain of muscle, fascia and tendon of right hip, initial encounter: Secondary | ICD-10-CM | POA: Insufficient documentation

## 2023-06-24 MED ORDER — METHOCARBAMOL 500 MG PO TABS: 500.0000 mg | ORAL_TABLET | Freq: Two times a day (BID) | ORAL | 0 refills | Status: DC

## 2023-06-24 MED ORDER — LIDOCAINE 5 % EX PTCH: 1.0000 | MEDICATED_PATCH | CUTANEOUS | 0 refills | Status: DC

## 2023-06-24 NOTE — ED Triage Notes (Signed)
 Pt with right hip pain x 1 week. Denies injury. Pain just starting to radiate down right leg.

## 2023-06-24 NOTE — Discharge Instructions (Signed)
Please use Tylenol or ibuprofen for pain.  You may use 600 mg ibuprofen every 6 hours or 1000 mg of Tylenol every 6 hours.  You may choose to alternate between the 2.  This would be most effective.  Not to exceed 4 g of Tylenol within 24 hours.  Not to exceed 3200 mg ibuprofen 24 hours.  You can use the muscle relaxant I am prescribing in addition to the above to help with any breakthrough pain.  You can take it up to twice daily.  It is safe to take at night, but I would be cautious taking it during the day as it can cause some drowsiness.  Make sure that you are feeling awake and alert before you get behind the wheel of a car or operate a motor vehicle.  It is not a narcotic pain medication so you are able to take it if it is not making you drowsy and still pilot a vehicle or machinery safely.  You can use the lidocaine patches directly where it hurts.

## 2023-06-24 NOTE — ED Provider Notes (Signed)
 [place] [facility] AT [facility] Provider Note   CSN: [id number] Arrival date & time: 06/24/23  1843     History  Chief Complaint  Patient presents with   Hip Pain    Jacqueline Koch is a 28 y.o. female with past medical history significant for scoliosis who presents with concern for right hip pain for one week without injury. Radiating down the right leg. No numbness, tingling. Has tried nothing for pain. No history of IVDU, chronic steroid use, cancer. Normal bladder, bowel function.    Hip Pain       Home Medications Prior to Admission medications   Medication Sig Start Date End Date Taking? Authorizing Provider  lidocaine  (LIDODERM ) 5 % Place 1 patch onto the skin daily. Remove & Discard patch within 12 hours or as directed by MD 06/24/23  Yes Illya Gienger H, PA-C  methocarbamol  (ROBAXIN ) 500 MG tablet Take 1 tablet (500 mg total) by mouth 2 (two) times daily. 06/24/23  Yes Mahkai Fangman H, PA-C  benzonatate  (TESSALON ) 100 MG capsule Take 1 capsule (100 mg total) by mouth 3 (three) times daily as needed for cough. Do not take with alcohol or while operating or driving heavy machinery 05/15/79   Wilhemena Harbour, NP  etonogestrel  (NEXPLANON ) 68 MG IMPL implant 1 each by Subdermal route once.    [provider]  fluticasone  (FLONASE ) 50 MCG/ACT nasal spray Place 1 spray into both nostrils daily. 03/26/23   Wilhemena Harbour, NP  metroNIDAZOLE  (FLAGYL ) 500 MG tablet Take 1 tablet (500 mg total) by mouth 2 (two) times daily. 06/13/23   Maryruth Sol, FNP      Allergies    Penicillins    Review of Systems   Review of Systems  All other systems reviewed and are negative.   Physical Exam Updated Vital Signs BP 110/68 (BP Location: Right Arm)   Pulse 89   Temp 98 F (36.7 C) (Temporal)   Resp 16   Ht 4\' 11"  (1.499 m)   Wt 66.7 kg   SpO2 99%   BMI 29.69 kg/m  Physical Exam Vitals and nursing note reviewed.   Constitutional:      General: She is not in acute distress.    Appearance: Normal appearance.  HENT:     Head: Normocephalic and atraumatic.  Eyes:     General:        Right eye: No discharge.        Left eye: No discharge.  Cardiovascular:     Rate and Rhythm: Normal rate and regular rhythm.     Pulses: Normal pulses.     Heart sounds: No murmur heard.    No friction rub. No gallop.  Pulmonary:     Effort: Pulmonary effort is normal.     Breath sounds: Normal breath sounds.  Abdominal:     General: Bowel sounds are normal.     Palpations: Abdomen is soft.  Musculoskeletal:     Comments: Intact strength of bilateral lower extremities, normal range of motion to flexion, extension. Mild tenderness to palpation of the right hip at trochanteric bursa.   Skin:    General: Skin is warm and dry.     Capillary Refill: Capillary refill takes less than 2 seconds.  Neurological:     Mental Status: She is alert and oriented to person, place, and time.  Psychiatric:        Mood and Affect: Mood normal.  Behavior: Behavior normal.     ED Results / Procedures / Treatments   Labs (all labs ordered are listed, but only abnormal results are displayed) Labs Reviewed - No data to display  EKG None  Radiology No results found.  Procedures Procedures    Medications Ordered in ED Medications - No data to display  ED Course/ Medical Decision Making/ A&P                                 Medical Decision Making  This patient is a 28 y.o. female who presents to the ED for concern of hip pain.   Differential diagnoses prior to evaluation: Fracture, dislocation versus sprain, strain, musculoskeletal injury, versus other.  Past Medical History / Social History / Additional history: Chart reviewed. Pertinent results include: scoliosis  Physical Exam: Physical exam performed. The pertinent findings include: Intact strength of bilateral lower extremities, normal range of  motion to flexion, extension. Mild tenderness to palpation of the right hip at trochanteric bursa.    Neurovascularly intact throughout bilateral lower extremities.  Medications / Treatment: Encouraged ibuprofen , Tylenol , muscle relaxant, lidocaine  patches as needed, orthopedic follow up as needed   Disposition: After consideration of the diagnostic results and the patients response to treatment, I feel that patient stable for discharge with plan as above .   emergency department workup does not suggest an emergent condition requiring admission or immediate intervention beyond what has been performed at this time. The plan is: as above. The patient is safe for discharge and has been instructed to return immediately for worsening symptoms, change in symptoms or any other concerns.  Final Clinical Impression(s) / ED Diagnoses Final diagnoses:  Hip strain, right, initial encounter    Rx / DC Orders ED Discharge Orders          Ordered    methocarbamol  (ROBAXIN ) 500 MG tablet  2 times daily        06/24/23 1913    lidocaine  (LIDODERM ) 5 %  Every 24 hours        06/24/23 1913              Stefan Edge 06/24/23 1919    Early Glisson, MD 06/25/23 [phone]

## 2023-07-05 ENCOUNTER — Ambulatory Visit: Admitting: Orthopedic Surgery

## 2023-07-05 ENCOUNTER — Other Ambulatory Visit (INDEPENDENT_AMBULATORY_CARE_PROVIDER_SITE_OTHER): Payer: Self-pay

## 2023-07-05 ENCOUNTER — Encounter: Payer: Self-pay | Admitting: Orthopedic Surgery

## 2023-07-05 VITALS — BP 100/66 | HR 98 | Ht 59.0 in | Wt 144.0 lb

## 2023-07-05 DIAGNOSIS — M25551 Pain in right hip: Secondary | ICD-10-CM

## 2023-07-05 MED ORDER — PREDNISONE 10 MG (21) PO TBPK: ORAL_TABLET | ORAL | 0 refills | Status: DC

## 2023-07-05 NOTE — Patient Instructions (Signed)

## 2023-07-07 NOTE — Progress Notes (Signed)
 New Patient Visit  Assessment: Jacqueline Koch is a 28 y.o. female with the following: 1. Pain in right hip; right buttock  Plan: IMOGEAN CIAMPA has diffuse and severe pain in the right hip, and buttock area.  Occasional pain into the groin.  Pain is primarily within the right buttock.  Presentation consistent with sciatica.  Muscle relaxers have not been helpful.  Radiographs are without acute findings, or concerning features.  She does not have any back pain.  She is moving very slowly, and clearly in discomfort.  I will provide her with a prednisone  Dosepak.  I would like to see her back in about 3 weeks.  Follow-up: Return in about 3 weeks (around 07/26/2023).  Subjective:  Chief Complaint  Patient presents with   Hip Pain    R side to groin area deep going down to the kneefor 2-3 wks getting worse. Was given muscle relaxers that don't help.     History of Present Illness: [name] is a 28 y.o. female who presents for evaluation of right hip pain.  She states that she has had pain in the right side of her hip for the past 2-3 weeks.  No specific injury.  Sudden onset.  She denies pain in her back.  She has some pain in her groin.  Most of your pain is in the right buttock.  This is causing her to walk very slowly.  She was sent home from work the other day.  She was evaluated the emergency department.  She was provided with muscle relaxers.  These have not been helpful.   Review of Systems: No fevers or chills No numbness or tingling No chest pain No shortness of breath No bowel or bladder dysfunction No GI distress No headaches   Medical History:  Past Medical History:  Diagnosis Date   Kidney stone    Nexplanon  removal 06/09/2014   Syncope     History reviewed. No pertinent surgical history.  Family History  Problem Relation Age of Onset   Asthma Brother    Arthritis Paternal Grandmother    Heart Problems Paternal Grandmother    Crohn's disease Maternal  Grandmother    Social History   Tobacco Use   Smoking status: Light Smoker    Types: Cigarettes, Cigars   Smokeless tobacco: Never   Tobacco comments:    1 cigarette daily   Vaping Use   Vaping status: Never Used  Substance Use Topics   Alcohol use: Yes    Alcohol/week: 0.0 standard drinks of alcohol    Comment: social   Drug use: No    Allergies  Allergen Reactions   Penicillins Other (See Comments)    Pt states she is not sure, she has been allergic since she was younger     Current Meds  Medication Sig   predniSONE  (STERAPRED UNI-PAK 21 TAB) 10 MG (21) TBPK tablet 10 mg DS 12 as directed    Objective: BP 100/66   Pulse 98   Ht 4\' 11"  (1.499 m)   Wt 144 lb (65.3 kg)   BMI 29.08 kg/m   Physical Exam:  General: Alert and oriented. and No acute distress. Gait: Slow, steady gait.  Right hip without deformity.  No redness.  No swelling.  Point tenderness within the right buttock.  No tenderness to palpation over the lateral hip.  She tolerates gentle range of motion of the right hip without discomfort.  Pain in the buttock with internal and external  rotation of the right hip.  Sensation is intact distally.  Negative straight leg raise.  IMAGING: I personally ordered and reviewed the following images   X-rays of the right hip were obtained in clinic today.  No acute injuries are noted.  No degenerative changes.  No evidence of AVN.  No bony lesions.  Impression: Negative right hip x-rays   New Medications:  Meds ordered this encounter  Medications   predniSONE  (STERAPRED UNI-PAK 21 TAB) 10 MG (21) TBPK tablet    Sig: 10 mg DS 12 as directed    Dispense:  48 tablet    Refill:  0      Tonita Frater, MD  07/07/2023 10:01 AM

## 2023-07-26 ENCOUNTER — Encounter: Payer: Self-pay | Admitting: Orthopedic Surgery

## 2023-07-26 ENCOUNTER — Ambulatory Visit: Admitting: Orthopedic Surgery

## 2023-07-26 DIAGNOSIS — M25551 Pain in right hip: Secondary | ICD-10-CM | POA: Diagnosis not present

## 2023-07-26 NOTE — Progress Notes (Signed)
 Return Patient Visit  Assessment: Jacqueline Koch is a 28 y.o. female with the following: 1. Pain in right hip; right buttock  Plan: NIMRAT WOOLWORTH continues to have pain in the right buttock, with some pains radiating distally.  She denies numbness and tingling.  She completed a course of steroids, which improved some of her symptoms.  However, when the prednisone  was completed, the pain has returned.  She still has difficulty at work.  We discussed taking a few days off of work.  I provided her with a work note.  I am concerned about irritation of the sciatic nerve, contributing to her symptoms.  As such, I would like her to be seen by Dr. Burnetta for full evaluation, consideration for an injection.  Follow-up: Return for Referral to Dr. Burnetta. .  Subjective:  Chief Complaint  Patient presents with   Back Pain    Pt states she was feeling/getting better while on the steroid. States she still had a limp but it wasn't as bad. After medications were completed pain has returned to previous severity.     History of Present Illness: Jacqueline Koch is a 28 y.o. female who returns for evaluation of right hip pain.  She continues to have pain in the right buttock.  She completed a short course of prednisone , and notes that she had improved symptoms while on the prednisone .  However, since stopping the Dosepak, her pain is returned.  Her pain gets worse at work.  She had a couple of days off work recently, and felt better.  She has been doing exercises at home.  She has been taking muscle relaxers.  No pain in lower back.  No numbness or tingling.  She notes that she has some swelling in her right knee.  No specific injury.  She states that she just woke up and started to have pain.  Review of Systems: No fevers or chills No numbness or tingling No chest pain No shortness of breath No bowel or bladder dysfunction No GI distress No headaches    Objective: There were no vitals taken for this  visit.  Physical Exam:  General: Alert and oriented. and No acute distress. Gait: Slow, steady gait.  Right hip no deformity.  No point tenderness over the greater trochanter laterally.  No point tenderness within the right buttock.  Negative straight leg raise.  No pain in the groin with internal and external rotation.  She has some radiating pains distally on both the front and the back of the leg.  Sensation intact to the right foot.  IMAGING: I personally ordered and reviewed the following images   No new imaging obtained today.  New Medications:  No orders of the defined types were placed in this encounter.     Jacqueline DELENA Horde, MD  07/26/2023 10:37 AM

## 2023-07-26 NOTE — Patient Instructions (Signed)
 Recommend some time off work  Note for work - out until 07/29/23  Referral to see Dr. Vaughn Georges

## 2023-08-06 ENCOUNTER — Ambulatory Visit: Admitting: Sports Medicine

## 2023-08-06 ENCOUNTER — Ambulatory Visit (INDEPENDENT_AMBULATORY_CARE_PROVIDER_SITE_OTHER): Payer: Self-pay

## 2023-08-06 ENCOUNTER — Encounter: Payer: Self-pay | Admitting: Sports Medicine

## 2023-08-06 DIAGNOSIS — M5441 Lumbago with sciatica, right side: Secondary | ICD-10-CM

## 2023-08-06 DIAGNOSIS — R29898 Other symptoms and signs involving the musculoskeletal system: Secondary | ICD-10-CM

## 2023-08-06 DIAGNOSIS — M25551 Pain in right hip: Secondary | ICD-10-CM

## 2023-08-06 DIAGNOSIS — G8929 Other chronic pain: Secondary | ICD-10-CM | POA: Diagnosis not present

## 2023-08-06 NOTE — Progress Notes (Unsigned)
 [name] - 28 y.o. female MRN [id number]  Date of birth: 10-08-1995  Office Visit Note: Visit Date: 08/06/2023 PCP: Pcp, No Referred by: Onesimo Oneil LABOR, MD  Subjective: Chief Complaint  Patient presents with   Right Hip - Pain    Complains of right hip pain with radiation leg pain and numbness. Pain and numbness stops at right knee. Pain for 2 months, continues to get worse. No injury. Mostly lateral hip, to buttocks, radiates down back of leg. No prior surgeries.    Lower Back - Pain   HPI: Jacqueline Koch is a pleasant 28 y.o. female who presents today for chronic and progressive right posterior hip/low back pain that radiates down the leg.  She had been seen previously by Dr. Onesimo, up in [place]. Did have hip x-rays which were largely unremarkable.  She did have an oral course of prednisone  which certainly improved her symptoms but did not resolve and after she was off of this her pain started to return.  Her pain is mostly over the lateral hip but she does have pain that radiates down to the level of the knee, in the buttocks and very occasionally within the groin.  She denies any numbness or tingling however.  She has been limping and altering her gait because of this.  She does work in [employer] as a Conservation officer, nature, transferring food to cars at times and has had to pull back from work because of her pain.  Prior to 2 months ago, she never had issues with the hip or back. Has used OTC anti-inflammatories. Used Robaxin  500mg  BID as needed. Her mother and daughter are present at the visit today.  Pertinent ROS were reviewed with the patient and found to be negative unless otherwise specified above in HPI.   Assessment & Plan: Visit Diagnoses:  1. Chronic right hip pain   2. Weakness of right hip   3. Chronic right-sided low back pain with right-sided sciatica    Plan: Impression is chronic right lateral hip pain and posterior hip/low back pain on the right side.  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  Through shared decision making, for both diagnostic and therapeutic purposes did proceed with greater trochanteric injection, patient tolerated well.  She does not have structural changes coming from the back.  I am more concerned about intra-articular pathology about the hip such as possibly labral abnormality or functional hip impingement.  Given this and her weakness about the hip joints, I would like to send her to formalized physical therapy, referral sent today.  For her discomfort, she may use Robaxin  500 mg twice daily as needed.  Okay for over-the-counter anti-inflammatories as needed.  I would like to see her back in about 2 weeks and reassess her response to the greater trochanteric injection.  If her lateral hip pain is improved but she is still having pain within the hip joint, next steps could be considering an MRI arthrogram of the right hip versus trialing a diagnostic/therapeutic intra-articular hip injection under ultrasound guidance.  We will write evaluate at her follow-up at that time.  Follow-up: Return in about 2 weeks (around 08/20/2023) for f/u hip/leg  Meds & Orders: No orders of the defined types were placed in this encounter.   Orders Placed This Encounter  Procedures   Large Joint Inj: R greater trochanter   XR Lumbar Spine Complete   Ambulatory referral to Physical Therapy  Procedures: Large Joint Inj: R greater trochanter on 08/06/2023 5:43 PM Indications: pain Details: 25 G 1.5 in needle, lateral approach Medications: 2 mL lidocaine  1 %; 2 mL bupivacaine  0.25 %; 6 mg betamethasone acetate-betamethasone sodium phosphate 6 (3-3) MG/ML Outcome: tolerated well, no immediate complications  Greater Trochanteric Bursa Injection, Right After discussion on risk/benefits/indications, an informed verbal consent was obtained and a timeout was performed. The patient was lying in lateral  recumbent position on exam table. The area overlying the trochanteric bursa was prepped with Betadine and alcohol swab then injected with 2:2:1 lidocaine :bupivicaine:celestone. Patient tolerated procedure well without immediate complications.  Procedure, treatment alternatives, risks and benefits explained, specific risks discussed. Consent was given by the patient. Immediately prior to procedure a time out was called to verify the correct patient, procedure, equipment, support staff and site/side marked as required. Patient was prepped and draped in the usual sterile fashion.          Clinical History: No specialty comments available.  She reports that she has been smoking cigarettes and cigars. She has never used smokeless tobacco. No results for input(s): HGBA1C, LABURIC in the last 8760 hours.  Objective:    Physical Exam  Gen: Well-appearing, in no acute distress; non-toxic CV: Well-perfused. Warm.  Resp: Breathing unlabored on room air; no wheezing. Psych: Fluid speech in conversation; appropriate affect; normal thought process  Ortho Exam - Hips: No specific TTP over the greater trochanteric region, although mild tenderness with deep palpation on the right.  There is pain within the hip joint anteriorly and posteriorly with FADIR testing.  Positive FABER testing as well.  There is weakness with resisted hip abduction on the right side compared to 5/5 strength on the contralateral left side.  No bony restriction with internal or external logroll.  There is guarding with hip strength testing on the right.  - Back: There is no midline spinous process TTP.  Full range of motion with flexion and extension.  There is no reproduction of symptoms with straight leg raise, although full range flexion about the leg does bother the right hip.  2+ Achilles and patellar DTRs bilaterally.  Imaging: XR Lumbar Spine Complete Complete lumbar film x-rays including AP, lateral, flexion/extension  views  were ordered and reviewed by myself today.  X-rays demonstrate a mild  thoracolumbar levoscoliosis.  There are 5 nonrib-bearing lumbar vertebra  with preserved intervertebral disc spacing.  No notable DDD or acute bony  abnormality noted.  *Independent review and interpretation of 3 view right hip x-ray from 07/05/2023 was performed by myself today.  AP, lateral and frog-leg lateral were reviewed.  Femoral head is well-seated within the acetabulum.  There is no joint space narrowing.  There is no cam or pincer deformities of the right or left hip.  No acute bony abnormality noted.  Narrative & Impression  X-rays of the right hip were obtained in clinic today.  No acute injuries are noted.  No degenerative changes.  No evidence of AVN.  No bony lesions.   Impression: Negative right hip x-rays      Past Medical/Family/Surgical/Social History: Medications & Allergies reviewed per EMR, new medications updated. Patient Active Problem List   Diagnosis Date Noted   Closed nondisplaced fracture of shaft of left clavicle 03/30/2020   Nondisplaced fracture of body of left scapula with routine healing 03/30/2020   Nexplanon  insertion 08/22/2015   Susceptible to varicella (non-immune), currently pregnant 12/13/2014   Marijuana use 12/08/2014   Chlamydia infection 01/20/2013  Adolescent scoliosis 12/01/2012   Abnormal vision screen 12/01/2012   Hearing deficit 12/01/2012   Scoliosis 11/02/2010   Back pain 11/02/2010   Past Medical History:  Diagnosis Date   Kidney stone    Nexplanon  removal 06/09/2014   Syncope    Family History  Problem Relation Age of Onset   Asthma Brother    Arthritis Paternal Grandmother    Heart Problems Paternal Grandmother    Crohn's disease Maternal Grandmother    History reviewed. No pertinent surgical history. Social History   Occupational History   Occupation: Consulting civil engineer  Tobacco Use   Smoking status: Light Smoker    Types: Cigarettes, Cigars    Smokeless tobacco: Never   Tobacco comments:    1 cigarette daily   Vaping Use   Vaping status: Never Used  Substance and Sexual Activity   Alcohol use: Yes    Alcohol/week: 0.0 standard drinks of alcohol    Comment: social   Drug use: No   Sexual activity: Not Currently    Birth control/protection: Implant

## 2023-08-07 ENCOUNTER — Encounter: Payer: Self-pay | Admitting: Sports Medicine

## 2023-08-07 MED ORDER — BETAMETHASONE SOD PHOS & ACET 6 (3-3) MG/ML IJ SUSP
6.0000 mg | INTRAMUSCULAR | Status: AC | PRN
  Administered 2023-08-06: 6 mg via INTRA_ARTICULAR

## 2023-08-07 MED ORDER — BUPIVACAINE HCL 0.25 % IJ SOLN
2.0000 mL | INTRAMUSCULAR | Status: AC | PRN
Start: 2023-08-06 — End: 2023-08-06
  Administered 2023-08-06: 2 mL via INTRA_ARTICULAR

## 2023-08-07 MED ORDER — LIDOCAINE HCL 1 % IJ SOLN
2.0000 mL | INTRAMUSCULAR | Status: AC | PRN
  Administered 2023-08-06: 2 mL

## 2023-08-20 ENCOUNTER — Encounter: Payer: Self-pay | Admitting: Sports Medicine

## 2023-08-20 ENCOUNTER — Other Ambulatory Visit: Payer: Self-pay

## 2023-08-20 ENCOUNTER — Ambulatory Visit: Admitting: Sports Medicine

## 2023-08-20 DIAGNOSIS — M25551 Pain in right hip: Secondary | ICD-10-CM | POA: Diagnosis not present

## 2023-08-20 DIAGNOSIS — R29898 Other symptoms and signs involving the musculoskeletal system: Secondary | ICD-10-CM

## 2023-08-20 DIAGNOSIS — G8929 Other chronic pain: Secondary | ICD-10-CM | POA: Diagnosis not present

## 2023-08-20 MED ORDER — METHYLPREDNISOLONE ACETATE 40 MG/ML IJ SUSP
40.0000 mg | INTRAMUSCULAR | Status: AC | PRN
  Administered 2023-08-20: 40 mg via INTRA_ARTICULAR

## 2023-08-20 MED ORDER — LIDOCAINE HCL 1 % IJ SOLN
4.0000 mL | INTRAMUSCULAR | Status: AC | PRN
  Administered 2023-08-20: 4 mL

## 2023-08-20 NOTE — Progress Notes (Signed)
 Patient says that she feels about 50% better since the injection. She has continued to do her exercises that were given to her initially - she says that she does all of the exercises, but has not been doing all of the sets and repetitions as they are listed. She says that when she gets about halfway through they will become more difficult.

## 2023-08-20 NOTE — Progress Notes (Signed)
 Jacqueline Koch - 28 y.o. female MRN [id number]  Date of birth: 12-09-1995  Office Visit Note: Visit Date: 08/20/2023 PCP: Pcp, No Referred by: No ref. provider found  Subjective: Chief Complaint  Patient presents with   Right Hip - Follow-up   HPI: Jacqueline Koch is a pleasant 28 y.o. female who presents today for chronic right hip pain.  We saw Jacqueline Koch just over 2 weeks ago and did provide a greater trochanteric injection.  This gave her about 50% relief of her hip and leg pain.  She is not having pain over the lateral side of the leg.  Feeling this more over the anterior hip/groin.  She is not having pain with rest as much anymore, but continues to have pain with walking.   Pertinent ROS were reviewed with the patient and found to be negative unless otherwise specified above in HPI.   Assessment & Plan: Visit Diagnoses:  1. Chronic right hip pain   2. Weakness of right hip    Plan: Impression is chronic right hip and leg pain which is improving.  She did receive about 50% relief as well as complete relief of her lateral hip symptoms with greater trochanteric injection.  She is still having difficulty with walking and pain over the anterior hip and in the groin.  She has been doing home physical therapy and exercises, but unfortunately cannot get started in formalized PT until early August.  We did give her a new sheet with additional hip stabilization and strengthening exercises that she may perform on her own in the interim.  We did discuss that I believe her pain is emanating from the intra-articular hip with labral abnormality suspected.  We discussed evaluating first with MRI arthrogram to visualize an exact diagnosis versus trialing an ultrasound-guided injection for both diagnostic and therapeutic purposes first.  Given her pain, she is looking for relief and would like to move forward with injection.  Ultrasound-guided right intra-articular hip injection performed today.  Did discuss  postinjection protocol.  After 48 to 72 hours of modified rest/activity, she may resume her HEP and transition through formalized PT.  I will see her back in about 6 weeks to reevaluate.  Okay for over-the-counter anti-inflammatories as needed.  Follow-up: Return in about 6 weeks (around 10/01/2023) for R-hip/leg .   Meds & Orders: No orders of the defined types were placed in this encounter.   Orders Placed This Encounter  Procedures   Large Joint Inj   US  Guided Needle Placement - No Linked Charges     Procedures: Large Joint Inj: R hip joint on 08/20/2023 4:17 PM Indications: pain Details: 22 G 3.5 in needle, ultrasound-guided anterior approach Medications: 4 mL lidocaine  1 %; 40 mg methylPREDNISolone  acetate 40 MG/ML Outcome: tolerated well, no immediate complications  Procedure: US -guided intra-articular hip injection, Right After discussion on risks/benefits/indications and informed verbal consent was obtained, a timeout was performed. Patient was lying supine on exam table. The hip was cleaned with betadine and alcohol swabs. Then utilizing ultrasound guidance, the patient's femoral head and neck junction was identified and subsequently injected with 4:1 lidocaine :depomedrol via an in-plane approach with ultrasound visualization of the injectate administered into the hip joint. Patient tolerated procedure well without immediate complications.  Procedure, treatment alternatives, risks and benefits explained, specific risks discussed. Consent was given by the patient. Immediately prior to procedure a time out was called to verify the correct patient, procedure, equipment, support staff and site/side marked as required. Patient  was prepped and draped in the usual sterile fashion.          Clinical History: No specialty comments available.  She reports that she has been smoking cigarettes and cigars. She has never used smokeless tobacco. No results for input(s): HGBA1C, LABURIC in  the last 8760 hours.  Objective:    Physical Exam  Gen: Well-appearing, in no acute distress; non-toxic CV: Well-perfused. Warm.  Resp: Breathing unlabored on room air; no wheezing. Psych: Fluid speech in conversation; appropriate affect; normal thought process  Ortho Exam - Right hip: No greater trochanteric TTP.  There is weakness and pain with hip abduction, positive Stinchfield, positive FADIR test, negative FABER test.  She does have delayed hip swing with guarding on hip flexion with ambulation on the right side, normal left hip swing.  Imaging: No results found.  Past Medical/Family/Surgical/Social History: Medications & Allergies reviewed per EMR, new medications updated. Patient Active Problem List   Diagnosis Date Noted   Closed nondisplaced fracture of shaft of left clavicle 03/30/2020   Nondisplaced fracture of body of left scapula with routine healing 03/30/2020   Nexplanon  insertion 08/22/2015   Susceptible to varicella (non-immune), currently pregnant 12/13/2014   Marijuana use 12/08/2014   Chlamydia infection 01/20/2013   Adolescent scoliosis 12/01/2012   Abnormal vision screen 12/01/2012   Hearing deficit 12/01/2012   Scoliosis 11/02/2010   Back pain 11/02/2010   Past Medical History:  Diagnosis Date   Kidney stone    Nexplanon  removal 06/09/2014   Syncope    Family History  Problem Relation Age of Onset   Asthma Brother    Arthritis Paternal Grandmother    Heart Problems Paternal Grandmother    Crohn's disease Maternal Grandmother    History reviewed. No pertinent surgical history. Social History   Occupational History   Occupation: Consulting civil engineer  Tobacco Use   Smoking status: Light Smoker    Types: Cigarettes, Cigars   Smokeless tobacco: Never   Tobacco comments:    1 cigarette daily   Vaping Use   Vaping status: Never Used  Substance and Sexual Activity   Alcohol use: Yes    Alcohol/week: 0.0 standard drinks of alcohol    Comment: social    Drug use: No   Sexual activity: Not Currently    Birth control/protection: Implant

## 2023-09-17 NOTE — Therapy (Signed)
 OUTPATIENT PHYSICAL THERAPY LOWER EXTREMITY EVALUATION   Patient Name: Jacqueline Koch MRN: [id number] DOB:1995/10/06, 28 y.o., female Today's Date: 09/18/2023  END OF SESSION:  PT End of Session - 09/18/23 0842     Visit Number 1    Number of Visits 8    Date for PT Re-Evaluation 10/18/23    Authorization Type [place] Medicaid Prepaid    Authorization Time Period please check auth    PT Start Time 0845    PT Stop Time 0930    PT Time Calculation (min) 45 min    Activity Tolerance Patient tolerated treatment well    Behavior During Therapy [facility] for tasks assessed/performed          Past Medical History:  Diagnosis Date   Kidney stone    Nexplanon  removal 06/09/2014   Syncope    No past surgical history on file. Patient Active Problem List   Diagnosis Date Noted   Closed nondisplaced fracture of shaft of left clavicle 03/30/2020   Nondisplaced fracture of body of left scapula with routine healing 03/30/2020   Nexplanon  insertion 08/22/2015   Susceptible to varicella (non-immune), currently pregnant 12/13/2014   Marijuana use 12/08/2014   Chlamydia infection 01/20/2013   Adolescent scoliosis 12/01/2012   Abnormal vision screen 12/01/2012   Hearing deficit 12/01/2012   Scoliosis 11/02/2010   Back pain 11/02/2010    PCP: none  REFERRING PROVIDER: Burnetta Brunet, DO REFERRING DIAG: [phone] (ICD-10-CM) - Chronic right hip pain  THERAPY DIAG:  Pain in right hip - Plan: PT plan of care cert/re-cert  Difficulty in walking, not elsewhere classified - Plan: PT plan of care cert/re-cert  Rationale for Evaluation and Treatment: Rehabilitation  ONSET DATE: 4 months  SUBJECTIVE:   SUBJECTIVE STATEMENT: Woke up one day and her right hip was hurting and has progressively got worse over time.  She is limping a significant amount on arrival; arrives with her daughter.  Went to ED first; referred to Dr. Onesimo.  He did x-rays and gave prednisone  and that helped a little but  then came right back; then referred to Dr. Burnetta.    PERTINENT HISTORY:  PAIN:  Are you having pain? Yes: NPRS scale: 9/10 with walking; 3-4 when sitting Pain location: right side groin and right leg down to knee Pain description: tight,  Aggravating factors: standing, walking Relieving factors: sitting down  PRECAUTIONS: None  RWEIGHT BEARING RESTRICTIONS: No  FALLS:  Has patient fallen in last 6 months? No  OCCUPATION: works at [employer]  PLOF: Independent  PATIENT GOALS: ready to walk normal  NEXT MD VISIT: next week sees Dr. Burnetta  OBJECTIVE:  Note: Objective measures were completed at Evaluation unless otherwise noted.  DIAGNOSTIC FINDINGS:  X-rays of the right hip were obtained in clinic today.  No acute injuries are noted.  No degenerative changes.  No evidence of AVN.  No bony lesions.   Impression: Negative right hip x-rays  Complete lumbar film x-rays including AP, lateral, flexion/extension views  were ordered and reviewed by myself today.  X-rays demonstrate a mild  thoracolumbar levoscoliosis.  There are 5 nonrib-bearing lumbar vertebra  with preserved intervertebral disc spacing.  No notable DDD or acute bony  abnormality noted.    PATIENT SURVEYS:  LEFS  Extreme difficulty/unable (0), Quite a bit of difficulty (1), Moderate difficulty (2), Little difficulty (3), No difficulty (4) Survey date:    Any of your usual work, housework or school activities   2. Usual hobbies, recreational or sporting  activities   3. Getting into/out of the bath   4. Walking between rooms   5. Putting on socks/shoes   6. Squatting    7. Lifting an object, like a bag of groceries from the floor   8. Performing light activities around your home   9. Performing heavy activities around your home   10. Getting into/out of a car   11. Walking 2 blocks   12. Walking 1 mile   13. Going up/down 10 stairs (1 flight)   14. Standing for 1 hour   15.  sitting for 1 hour   16. Running  on even ground   17. Running on uneven ground   18. Making sharp turns while running fast   19. Hopping    20. Rolling over in bed   Score total:  12/80; 15%     COGNITION: Overall cognitive status: Within functional limits for tasks assessed     SENSATION: WFL  EDEMA:  None noted   POSTURE: increased lumbar lordosis and weight shift left  PALPATION: Tender right greater troch; glute; piriformis  LOWER EXTREMITY ROM:  Active ROM Right eval Left eval  Hip flexion    Hip extension    Hip abduction    Hip adduction    Hip internal rotation    Hip external rotation    Knee flexion    Knee extension    Ankle dorsiflexion    Ankle plantarflexion    Ankle inversion    Ankle eversion     (Blank rows = not tested)  LOWER EXTREMITY MMT:  MMT Right eval Left eval  Hip flexion 2 4+  Hip extension    Hip abduction    Hip adduction    Hip internal rotation    Hip external rotation    Knee flexion    Knee extension 4+ 5  Ankle dorsiflexion 4+ 5  Ankle plantarflexion    Ankle inversion    Ankle eversion       (Blank rows = not tested) LUMBAR ROM: *pain  Active  A/PROM  eval  Flexion Fingertips* to mid shin painful right anterior hip  Extension 70% available no pain  Right lateral flexion Fingertips to knee joint line  Left lateral flexion Fingertips to knee joint line  Right rotation   Left rotation    (Blank rows = not tested)   FUNCTIONAL TESTS:  5 times sit to stand: 23.84 using hands to assist up; leaning to the left Timed up and go (TUG): 22.21 sec heavy limp  GAIT: Distance walked: 50 ft in clinic Assistive device utilized: None Level of assistance: SBA Comments: heavy limp; improved with trial of using SPC                                                                                                                                TREATMENT DATE: 09/18/23 physical therapy evaluation and HEP instructions  PATIENT EDUCATION:  Education  details: Patient educated on exam findings, POC, scope of PT, HEP, and what to expect next visit. Person educated: Patient Education method: Explanation, Demonstration, and Handouts Education comprehension: verbalized understanding, returned demonstration, verbal cues required, and tactile cues required HOME EXERCISE PROGRAM: Access Code: [facility] URL: [email or web address] Date: 22-Sep-2023 Prepared by: AP - Rehab  Exercises - Standing Lumbar Extension with Counter  - 8 x daily - 7 x weekly - 1 sets - 10 reps - Standing Lumbar Extension  - 8 x daily - 7 x weekly - 1 sets - 10 reps  ASSESSMENT:  CLINICAL IMPRESSION: Patient is a 28 y.o. female who was seen today for physical therapy evaluation and treatment for M25.551,G89.29 (ICD-10-CM) - Chronic right hip painM25.551,G89.29 (ICD-10-CM) - Chronic right hip pain. . Patient demonstrates muscle weakness, reduced ROM, and fascial restrictions which are likely contributing to symptoms of pain and are negatively impacting patient ability to perform ADLs and functional mobility tasks. Patient will benefit from skilled physical therapy services to address these deficits to reduce pain and improve level of function with ADLs and functional mobility tasks.   OBJECTIVE IMPAIRMENTS: Abnormal gait, decreased activity tolerance, decreased balance, decreased knowledge of condition, decreased knowledge of use of DME, decreased mobility, difficulty walking, decreased ROM, decreased strength, increased fascial restrictions, impaired perceived functional ability, postural dysfunction, and pain.   ACTIVITY LIMITATIONS: carrying, lifting, bending, standing, sleeping, stairs, transfers, bed mobility, and locomotion level  PARTICIPATION LIMITATIONS: meal prep, cleaning, laundry, shopping, community activity, and occupation  REHAB POTENTIAL: Good  CLINICAL DECISION MAKING: Evolving/moderate complexity  EVALUATION COMPLEXITY:  Moderate   GOALS: Goals reviewed with patient? No  SHORT TERM GOALS: Target date: 10/02/2023 patient will be independent with initial HEP  Baseline: Goal status: INITIAL  2.  Patient will report 50% improvement overall  Baseline:  Goal status: INITIAL LONG TERM GOALS: Target date: 10/18/2023   Patient will be independent in self management strategies to improve quality of life and functional outcomes.  Baseline:  Goal status: INITIAL  2.  Patient will report 80% improvement overall  Baseline:  Goal status: INITIAL  3.  Patient will improve LEFS score by 30 points to demonstrate improved perceived function  Baseline: 12/80 Goal status: INITIAL  4.  Patient will improve TUG score to 10 sec or less to demonstrate improved functional mobility and decreased fall risk  Baseline: 22.21 Goal status: INITIAL  5.  Patient will improve 5 times sit to stand score to 15 sec or less to demonstrate improved functional mobility and increased leg strength.    Baseline: 23.84 sec Goal status: INITIAL    PLAN:  PT FREQUENCY: 2x/week  PT DURATION: 4 weeks  PLANNED INTERVENTIONS: 97164- PT Re-evaluation, 97110-Therapeutic exercises, 97530- Therapeutic activity, 97112- Neuromuscular re-education, 97535- Self Care, 02859- Manual therapy, [id number]- Gait training, [phone]- Orthotic Fit/training, [phone]- Canalith repositioning, [id number]- Aquatic Therapy, [phone]- Splinting, [phone]- Wound care (first 20 sq cm), 97598- Wound care (each additional 20 sq cm)Patient/Family education, Balance training, Stair training, Taping, Dry Needling, Joint mobilization, Joint manipulation, Spinal manipulation, Spinal mobilization, Scar mobilization, and DME instructions.   PLAN FOR NEXT SESSION: Review HEP and goals; assess reaction to repeated lumbar extensions; check hamstring length; check hip height; likely lumbar involvement  11:10 AM, 09/22/2023 Lore Polka Small Sehaj Kolden MPT [place] [place]  [phone] Ph:[phone]   Managed Medicaid Authorization Request Treatment Start Date: 09-22-23  Visit Dx Codes: M25.551, R26.2  Functional Tool Score: LEFS 12/80 15%  For all possible CPT  codes, reference the Planned Interventions line above.     Check all conditions that are expected to impact treatment: {Conditions expected to impact treatment:None of these apply   If treatment provided at initial evaluation, no treatment charged due to lack of authorization.

## 2023-09-18 ENCOUNTER — Other Ambulatory Visit: Payer: Self-pay

## 2023-09-18 ENCOUNTER — Ambulatory Visit (HOSPITAL_COMMUNITY): Attending: Sports Medicine

## 2023-09-18 DIAGNOSIS — M25551 Pain in right hip: Secondary | ICD-10-CM | POA: Diagnosis not present

## 2023-09-18 DIAGNOSIS — R262 Difficulty in walking, not elsewhere classified: Secondary | ICD-10-CM | POA: Diagnosis not present

## 2023-09-18 DIAGNOSIS — G8929 Other chronic pain: Secondary | ICD-10-CM | POA: Diagnosis not present

## 2023-09-20 ENCOUNTER — Encounter (HOSPITAL_COMMUNITY): Payer: Self-pay

## 2023-09-20 ENCOUNTER — Ambulatory Visit (HOSPITAL_COMMUNITY)

## 2023-09-20 DIAGNOSIS — M25551 Pain in right hip: Secondary | ICD-10-CM

## 2023-09-20 DIAGNOSIS — G8929 Other chronic pain: Secondary | ICD-10-CM | POA: Diagnosis not present

## 2023-09-20 DIAGNOSIS — R262 Difficulty in walking, not elsewhere classified: Secondary | ICD-10-CM | POA: Diagnosis not present

## 2023-09-20 NOTE — Therapy (Signed)
 OUTPATIENT PHYSICAL THERAPY LOWER EXTREMITY EVALUATION   Patient Name: Jacqueline Koch MRN: [id number] DOB:24-Apr-1995, 28 y.o., female Today's Date: 09/20/2023  END OF SESSION:  PT End of Session - 09/20/23 0758     Visit Number 2    Number of Visits 8    Date for PT Re-Evaluation 10/18/23    Authorization Type [place] Medicaid Prepaid    Authorization Time Period 6 visits 8/15-10/13    Authorization - Visit Number 1    Progress Note Due on Visit 6    PT Start Time 0759    PT Stop Time 0840    PT Time Calculation (min) 41 min    Activity Tolerance Patient tolerated treatment well    Behavior During Therapy [facility] for tasks assessed/performed          Past Medical History:  Diagnosis Date   Kidney stone    Nexplanon  removal 06/09/2014   Syncope    History reviewed. No pertinent surgical history. Patient Active Problem List   Diagnosis Date Noted   Closed nondisplaced fracture of shaft of left clavicle 03/30/2020   Nondisplaced fracture of body of left scapula with routine healing 03/30/2020   Nexplanon  insertion 08/22/2015   Susceptible to varicella (non-immune), currently pregnant 12/13/2014   Marijuana use 12/08/2014   Chlamydia infection 01/20/2013   Adolescent scoliosis 12/01/2012   Abnormal vision screen 12/01/2012   Hearing deficit 12/01/2012   Scoliosis 11/02/2010   Back pain 11/02/2010    PCP: none  REFERRING PROVIDER: Burnetta Brunet, DO REFERRING DIAG: [phone] (ICD-10-CM) - Chronic right hip pain  THERAPY DIAG:  Pain in right hip  Difficulty in walking, not elsewhere classified  Rationale for Evaluation and Treatment: Rehabilitation  ONSET DATE: 4 months  SUBJECTIVE:   SUBJECTIVE STATEMENT: Pt states HEP is going good but pain is still about the same in the right hip, 7/10. Pt states HEP is going well, not bothering her.   Eval: Woke up one day and her right hip was hurting and has progressively got worse over time.  She is limping a significant  amount on arrival; arrives with her daughter.  Went to ED first; referred to Dr. Onesimo.  He did x-rays and gave prednisone  and that helped a little but then came right back; then referred to Dr. Burnetta.    PERTINENT HISTORY:  PAIN:  Are you having pain? Yes: NPRS scale: 9/10 with walking; 3-4 when sitting Pain location: right side groin and right leg down to knee Pain description: tight,  Aggravating factors: standing, walking Relieving factors: sitting down  PRECAUTIONS: None  RWEIGHT BEARING RESTRICTIONS: No  FALLS:  Has patient fallen in last 6 months? No  OCCUPATION: works at [employer]  PLOF: Independent  PATIENT GOALS: ready to walk normal  NEXT MD VISIT: next week sees Dr. Burnetta  OBJECTIVE:  Note: Objective measures were completed at Evaluation unless otherwise noted.  DIAGNOSTIC FINDINGS:  X-rays of the right hip were obtained in clinic today.  No acute injuries are noted.  No degenerative changes.  No evidence of AVN.  No bony lesions.   Impression: Negative right hip x-rays  Complete lumbar film x-rays including AP, lateral, flexion/extension views  were ordered and reviewed by myself today.  X-rays demonstrate a mild  thoracolumbar levoscoliosis.  There are 5 nonrib-bearing lumbar vertebra  with preserved intervertebral disc spacing.  No notable DDD or acute bony  abnormality noted.    PATIENT SURVEYS:  LEFS  Extreme difficulty/unable (0), Quite a bit of  difficulty (1), Moderate difficulty (2), Little difficulty (3), No difficulty (4) Survey date:    Any of your usual work, housework or school activities   2. Usual hobbies, recreational or sporting activities   3. Getting into/out of the bath   4. Walking between rooms   5. Putting on socks/shoes   6. Squatting    7. Lifting an object, like a bag of groceries from the floor   8. Performing light activities around your home   9. Performing heavy activities around your home   10. Getting into/out of a car    11. Walking 2 blocks   12. Walking 1 mile   13. Going up/down 10 stairs (1 flight)   14. Standing for 1 hour   15.  sitting for 1 hour   16. Running on even ground   17. Running on uneven ground   18. Making sharp turns while running fast   19. Hopping    20. Rolling over in bed   Score total:  12/80; 15%     COGNITION: Overall cognitive status: Within functional limits for tasks assessed     SENSATION: WFL  EDEMA:  None noted   POSTURE: increased lumbar lordosis and weight shift left  PALPATION: Tender right greater troch; glute; piriformis  LOWER EXTREMITY ROM:  Active ROM Right eval Left eval  Hip flexion    Hip extension    Hip abduction    Hip adduction    Hip internal rotation    Hip external rotation    Knee flexion    Knee extension    Ankle dorsiflexion    Ankle plantarflexion    Ankle inversion    Ankle eversion     (Blank rows = not tested)  LOWER EXTREMITY MMT:  MMT Right eval Left eval  Hip flexion 2 4+  Hip extension    Hip abduction    Hip adduction    Hip internal rotation    Hip external rotation    Knee flexion    Knee extension 4+ 5  Ankle dorsiflexion 4+ 5  Ankle plantarflexion    Ankle inversion    Ankle eversion       (Blank rows = not tested) LUMBAR ROM: *pain  Active  A/PROM  eval  Flexion Fingertips* to mid shin painful right anterior hip  Extension 70% available no pain  Right lateral flexion Fingertips to knee joint line  Left lateral flexion Fingertips to knee joint line  Right rotation   Left rotation    (Blank rows = not tested)   FUNCTIONAL TESTS:  5 times sit to stand: 23.84 using hands to assist up; leaning to the left Timed up and go (TUG): 22.21 sec heavy limp  GAIT: Distance walked: 50 ft in clinic Assistive device utilized: None Level of assistance: SBA Comments: heavy limp; improved with trial of using SPC  TREATMENT DATE:  09/20/2023  Manual Therapy: -Short axis distraction of right hip joint, 5 bouts with therapist force with gait belt around proximal femur, in supine,  -PROM of right hip in supine, flexion/extension, IR and ER 10 reps for synovial fluid movement in joint Therapeutic Exercise: -Supine bridges 2 sets of 10 reps, 3 second holds, symptomatic, pt cued for max hip extension -Side lying Clamshells, 1 set of 10 reps bilaterally Neuromuscular Re-education: -PPT 1 set of 10 reps, 3 second holds, pr cued to remain in pain free ROM -LTR 1 set of 10 reps bilaterally, pt cued to remain in pain free ROM -DKTC in supine on green theraball, 2 sets of 10 reps, pt reports decreased pain with hip flexion -Hooklying, hip abduction isometric, 1 set of 10 reps, 5 second hold  09/18/23 physical therapy evaluation and HEP instructions    PATIENT EDUCATION:  Education details: Patient educated on exam findings, POC, scope of PT, HEP, and what to expect next visit. Person educated: Patient Education method: Explanation, Demonstration, and Handouts Education comprehension: verbalized understanding, returned demonstration, verbal cues required, and tactile cues required HOME EXERCISE PROGRAM: Access Code: [facility] URL: [email or web address] Date: 09/18/2023 Prepared by: AP - Rehab  Exercises - Standing Lumbar Extension with Counter  - 8 x daily - 7 x weekly - 1 sets - 10 reps - Standing Lumbar Extension  - 8 x daily - 7 x weekly - 1 sets - 10 reps  Access Code: [email or web address] URL: [email or web address] Date: 09/20/2023 Prepared by: Lang Ada  Exercises - Supine Heel Slide with Strap  - 1 x daily - 7 x weekly - 3 sets - 10 reps - Supine Bridge  - 1 x daily - 7 x weekly - 3 sets - 10 reps - 3 hold - Supine Lower Trunk Rotation  - 1 x daily - 7 x weekly - 3 sets - 10 reps - Supine Posterior Pelvic Tilt  - 1 x daily - 7 x  weekly - 3 sets - 10 reps - Hooklying Isometric Hip Abduction with Belt  - 1 x daily - 7 x weekly - 3 sets - 10 reps - 5 hold ASSESSMENT:  CLINICAL IMPRESSION: Patient continues to demonstrate increased right hip pain, decreased RLE strength, decreased gait quality and balance. Patient also demonstrates decreased ability with SLR and clamshell on RLE during today's session. Patient able to progress LE strengthening and core activation exercises today with bridge and other supine activities, good performance with verbal cueing. Pt reports she feels a little bit looser after session and responded well to the manual therapy to R hip joint. Patient would continue to benefit from skilled physical therapy for decreased R hip pain, increased endurance with ambulation, increased LE strength, and improved balance for improved quality of life, improved independence with gait training and continued progress towards therapy goals.   Eval: Patient is a 28 y.o. female who was seen today for physical therapy evaluation and treatment for M25.551,G89.29 (ICD-10-CM) - Chronic right hip painM25.551,G89.29 (ICD-10-CM) - Chronic right hip pain. . Patient demonstrates muscle weakness, reduced ROM, and fascial restrictions which are likely contributing to symptoms of pain and are negatively impacting patient ability to perform ADLs and functional mobility tasks. Patient will benefit from skilled physical therapy services to address these deficits to reduce pain and improve level of function with ADLs and functional mobility tasks.   OBJECTIVE IMPAIRMENTS: Abnormal gait, decreased activity tolerance, decreased balance, decreased knowledge of condition, decreased knowledge of use of DME, decreased  mobility, difficulty walking, decreased ROM, decreased strength, increased fascial restrictions, impaired perceived functional ability, postural dysfunction, and pain.   ACTIVITY LIMITATIONS: carrying, lifting, bending, standing,  sleeping, stairs, transfers, bed mobility, and locomotion level  PARTICIPATION LIMITATIONS: meal prep, cleaning, laundry, shopping, community activity, and occupation  REHAB POTENTIAL: Good  CLINICAL DECISION MAKING: Evolving/moderate complexity  EVALUATION COMPLEXITY: Moderate   GOALS: Goals reviewed with patient? Yes  SHORT TERM GOALS: Target date: 10/02/2023 patient will be independent with initial HEP  Baseline: Goal status: INITIAL  2.  Patient will report 50% improvement overall  Baseline:  Goal status: INITIAL LONG TERM GOALS: Target date: 10/18/2023   Patient will be independent in self management strategies to improve quality of life and functional outcomes.  Baseline:  Goal status: INITIAL  2.  Patient will report 80% improvement overall  Baseline:  Goal status: INITIAL  3.  Patient will improve LEFS score by 30 points to demonstrate improved perceived function  Baseline: 12/80 Goal status: INITIAL  4.  Patient will improve TUG score to 10 sec or less to demonstrate improved functional mobility and decreased fall risk  Baseline: 22.21 Goal status: INITIAL  5.  Patient will improve 5 times sit to stand score to 15 sec or less to demonstrate improved functional mobility and increased leg strength.    Baseline: 23.84 sec Goal status: INITIAL    PLAN:  PT FREQUENCY: 2x/week  PT DURATION: 4 weeks  PLANNED INTERVENTIONS: 97164- PT Re-evaluation, 97110-Therapeutic exercises, 97530- Therapeutic activity, 97112- Neuromuscular re-education, 97535- Self Care, 02859- Manual therapy, [id number]- Gait training, [phone]- Orthotic Fit/training, [phone]- Canalith repositioning, [id number]- Aquatic Therapy, [phone]- Splinting, [phone]- Wound care (first 20 sq cm), 97598- Wound care (each additional 20 sq cm)Patient/Family education, Balance training, Stair training, Taping, Dry Needling, Joint mobilization, Joint manipulation, Spinal manipulation, Spinal mobilization, Scar  mobilization, and DME instructions.   PLAN FOR NEXT SESSION: assess reaction to repeated lumbar extensions; check hamstring length; check hip height; likely lumbar involvement, continue hip distraction techniques  Lang Ada, PT, DPT [facility] Office: [phone] 8:51 AM, 09/20/23

## 2023-09-24 ENCOUNTER — Ambulatory Visit (HOSPITAL_COMMUNITY): Admitting: Physical Therapy

## 2023-09-24 DIAGNOSIS — R262 Difficulty in walking, not elsewhere classified: Secondary | ICD-10-CM | POA: Diagnosis not present

## 2023-09-24 DIAGNOSIS — M25551 Pain in right hip: Secondary | ICD-10-CM | POA: Diagnosis not present

## 2023-09-24 DIAGNOSIS — G8929 Other chronic pain: Secondary | ICD-10-CM | POA: Diagnosis not present

## 2023-09-24 NOTE — Therapy (Signed)
 OUTPATIENT PHYSICAL THERAPY LOWER EXTREMITY TREATMENT   Patient Name: Jacqueline Koch MRN: [id number] DOB:11-27-1995, 28 y.o., female Today's Date: 09/24/2023  END OF SESSION:  PT End of Session - 09/24/23 1016     Visit Number 3    Number of Visits 8    Date for PT Re-Evaluation 10/18/23    Authorization Type [place] Medicaid Prepaid    Authorization Time Period 6 visits 8/15-10/13    Authorization - Visit Number 2    Authorization - Number of Visits 6    Progress Note Due on Visit 6    PT Start Time 0935    PT Stop Time 1017    PT Time Calculation (min) 42 min    Activity Tolerance Patient tolerated treatment well    Behavior During Therapy [facility] for tasks assessed/performed           Past Medical History:  Diagnosis Date   Kidney stone    Nexplanon  removal 06/09/2014   Syncope    No past surgical history on file. Patient Active Problem List   Diagnosis Date Noted   Closed nondisplaced fracture of shaft of left clavicle 03/30/2020   Nondisplaced fracture of body of left scapula with routine healing 03/30/2020   Nexplanon  insertion 08/22/2015   Susceptible to varicella (non-immune), currently pregnant 12/13/2014   Marijuana use 12/08/2014   Chlamydia infection 01/20/2013   Adolescent scoliosis 12/01/2012   Abnormal vision screen 12/01/2012   Hearing deficit 12/01/2012   Scoliosis 11/02/2010   Back pain 11/02/2010    PCP: none  REFERRING PROVIDER: Burnetta Brunet, DO REFERRING DIAG: [phone] (ICD-10-CM) - Chronic right hip pain  THERAPY DIAG:  Pain in right hip  Difficulty in walking, not elsewhere classified  Rationale for Evaluation and Treatment: Rehabilitation  ONSET DATE: 4 months  SUBJECTIVE:   SUBJECTIVE STATEMENT: Pt states pain at 4/10 currently in Rt hip. Returns to Dr Burnetta next week for MRI and hopes to find out what is going in in hip.    Eval: Woke up one day and her right hip was hurting and has progressively got worse over time.  She is  limping a significant amount on arrival; arrives with her daughter.  Went to ED first; referred to Dr. Onesimo.  He did x-rays and gave prednisone  and that helped a little but then came right back; then referred to Dr. Burnetta.    PERTINENT HISTORY:  PAIN:  Are you having pain? Yes: NPRS scale: 9/10 with walking; 3-4 when sitting Pain location: right side groin and right leg down to knee Pain description: tight,  Aggravating factors: standing, walking Relieving factors: sitting down  PRECAUTIONS: None  RWEIGHT BEARING RESTRICTIONS: No  FALLS:  Has patient fallen in last 6 months? No  OCCUPATION: works at [employer]  PLOF: Independent  PATIENT GOALS: ready to walk normal  NEXT MD VISIT: next week sees Dr. Burnetta  OBJECTIVE:  Note: Objective measures were completed at Evaluation unless otherwise noted.  DIAGNOSTIC FINDINGS:  X-rays of the right hip were obtained in clinic today.  No acute injuries are noted.  No degenerative changes.  No evidence of AVN.  No bony lesions.   Impression: Negative right hip x-rays  Complete lumbar film x-rays including AP, lateral, flexion/extension views  were ordered and reviewed by myself today.  X-rays demonstrate a mild  thoracolumbar levoscoliosis.  There are 5 nonrib-bearing lumbar vertebra  with preserved intervertebral disc spacing.  No notable DDD or acute bony  abnormality noted.  PATIENT SURVEYS:  LEFS  Extreme difficulty/unable (0), Quite a bit of difficulty (1), Moderate difficulty (2), Little difficulty (3), No difficulty (4) Survey date:    Any of your usual work, housework or school activities   2. Usual hobbies, recreational or sporting activities   3. Getting into/out of the bath   4. Walking between rooms   5. Putting on socks/shoes   6. Squatting    7. Lifting an object, like a bag of groceries from the floor   8. Performing light activities around your home   9. Performing heavy activities around your home   10. Getting  into/out of a car   11. Walking 2 blocks   12. Walking 1 mile   13. Going up/down 10 stairs (1 flight)   14. Standing for 1 hour   15.  sitting for 1 hour   16. Running on even ground   17. Running on uneven ground   18. Making sharp turns while running fast   19. Hopping    20. Rolling over in bed   Score total:  12/80; 15%     COGNITION: Overall cognitive status: Within functional limits for tasks assessed     SENSATION: WFL  EDEMA:  None noted   POSTURE: increased lumbar lordosis and weight shift left  PALPATION: Tender right greater troch; glute; piriformis  LOWER EXTREMITY ROM:  Active ROM Right 09/24/23 Left 09/24/23  Hip flexion 95 105  Hip extension    Hip abduction    Hip adduction    Hip internal rotation    Hip external rotation    Knee flexion    Knee extension    Ankle dorsiflexion    Ankle plantarflexion    Ankle inversion    Ankle eversion     (Blank rows = not tested)  LOWER EXTREMITY MMT:  MMT Right eval Left eval  Hip flexion 2 4+  Hip extension    Hip abduction    Hip adduction    Hip internal rotation    Hip external rotation    Knee flexion    Knee extension 4+ 5  Ankle dorsiflexion 4+ 5  Ankle plantarflexion    Ankle inversion    Ankle eversion       (Blank rows = not tested) LUMBAR ROM: *pain  Active  A/PROM  eval  Flexion Fingertips* to mid shin painful right anterior hip  Extension 70% available no pain  Right lateral flexion Fingertips to knee joint line  Left lateral flexion Fingertips to knee joint line  Right rotation   Left rotation    (Blank rows = not tested)   FUNCTIONAL TESTS:  5 times sit to stand: 23.84 using hands to assist up; leaning to the left Timed up and go (TUG): 22.21 sec heavy limp  GAIT: Distance walked: 50 ft in clinic Assistive device utilized: None Level of assistance: SBA Comments: heavy limp; improved with trial of using SPC  TREATMENT DATE:  09/24/23 Supine: hamstring measurement  bridge 2X10  SLR Lt 10X, Rt unable  Rt heelslides 10X  Rt SAQ 10X5  Pelvic tilts 20X5   Rt IR/ER 10X for ROM LTR rocking with physioball 10 reps bilaterally DKTC in supine on green theraball, 2 sets of 10 reps Sidelying clams 20X 3 each LE Standing lumbar extensions 10X   09/20/2023  Manual Therapy: -Short axis distraction of right hip joint, 5 bouts with therapist force with gait belt around proximal femur, in supine,  -PROM of right hip in supine, flexion/extension, IR and ER 10 reps for synovial fluid movement in joint Therapeutic Exercise: -Supine bridges 2 sets of 10 reps, 3 second holds, symptomatic, pt cued for max hip extension -Side lying Clamshells, 1 set of 10 reps bilaterally Neuromuscular Re-education: -PPT 1 set of 10 reps, 3 second holds, pr cued to remain in pain free ROM -LTR 1 set of 10 reps bilaterally, pt cued to remain in pain free ROM -DKTC in supine on green theraball, 2 sets of 10 reps, pt reports decreased pain with hip flexion -Hooklying, hip abduction isometric, 1 set of 10 reps, 5 second hold  09/18/23 physical therapy evaluation and HEP instructions    PATIENT EDUCATION:  Education details: Patient educated on exam findings, POC, scope of PT, HEP, and what to expect next visit. Person educated: Patient Education method: Explanation, Demonstration, and Handouts Education comprehension: verbalized understanding, returned demonstration, verbal cues required, and tactile cues required HOME EXERCISE PROGRAM: Access Code: [facility] URL: [email or web address] Date: 09/18/2023 Prepared by: AP - Rehab  Exercises - Standing Lumbar Extension with Counter  - 8 x daily - 7 x weekly - 1 sets - 10 reps - Standing Lumbar Extension  - 8 x daily - 7 x weekly - 1 sets - 10 reps  Access Code: [email or web address] URL:  [email or web address] Date: 09/20/2023 Prepared by: Lang Ada  Exercises - Supine Heel Slide with Strap  - 1 x daily - 7 x weekly - 3 sets - 10 reps - Supine Bridge  - 1 x daily - 7 x weekly - 3 sets - 10 reps - 3 hold - Supine Lower Trunk Rotation  - 1 x daily - 7 x weekly - 3 sets - 10 reps - Supine Posterior Pelvic Tilt  - 1 x daily - 7 x weekly - 3 sets - 10 reps - Hooklying Isometric Hip Abduction with Belt  - 1 x daily - 7 x weekly - 3 sets - 10 reps - 5 hold ASSESSMENT:  CLINICAL IMPRESSION: Measured hamstring length and recorded in chart above under hip flexion.  Pt reports no change with lumbar extensions.  Continued with focus on improving LE strength and stability. Patient unable to complete SLR with Rt LE so continued with heelslides.  Also added SAQ to engage quadriceps.  Pt able to complete all exercises today in painfree ROM without complaints or pain behaviors.    Continues to present with antalgic gait using QC.   Patient would continue to benefit from skilled physical therapy for decreased R hip pain, increased endurance with ambulation, increased LE strength, and improved balance for improved quality of life, improved independence with gait training and continued progress towards therapy goals.   Eval: Patient is a 28 y.o. female who was seen today for physical therapy evaluation and treatment for M25.551,G89.29 (ICD-10-CM) - Chronic right hip painM25.551,G89.29 (ICD-10-CM) - Chronic right hip pain. . Patient demonstrates muscle weakness, reduced ROM, and fascial restrictions which are likely  contributing to symptoms of pain and are negatively impacting patient ability to perform ADLs and functional mobility tasks. Patient will benefit from skilled physical therapy services to address these deficits to reduce pain and improve level of function with ADLs and functional mobility tasks.   OBJECTIVE IMPAIRMENTS: Abnormal gait, decreased activity tolerance, decreased  balance, decreased knowledge of condition, decreased knowledge of use of DME, decreased mobility, difficulty walking, decreased ROM, decreased strength, increased fascial restrictions, impaired perceived functional ability, postural dysfunction, and pain.   ACTIVITY LIMITATIONS: carrying, lifting, bending, standing, sleeping, stairs, transfers, bed mobility, and locomotion level  PARTICIPATION LIMITATIONS: meal prep, cleaning, laundry, shopping, community activity, and occupation  REHAB POTENTIAL: Good  CLINICAL DECISION MAKING: Evolving/moderate complexity  EVALUATION COMPLEXITY: Moderate   GOALS: Goals reviewed with patient? Yes  SHORT TERM GOALS: Target date: 10/02/2023 patient will be independent with initial HEP  Baseline: Goal status: INITIAL  2.  Patient will report 50% improvement overall  Baseline:  Goal status: INITIAL LONG TERM GOALS: Target date: 10/18/2023   Patient will be independent in self management strategies to improve quality of life and functional outcomes.  Baseline:  Goal status: INITIAL  2.  Patient will report 80% improvement overall  Baseline:  Goal status: INITIAL  3.  Patient will improve LEFS score by 30 points to demonstrate improved perceived function  Baseline: 12/80 Goal status: INITIAL  4.  Patient will improve TUG score to 10 sec or less to demonstrate improved functional mobility and decreased fall risk  Baseline: 22.21 Goal status: INITIAL  5.  Patient will improve 5 times sit to stand score to 15 sec or less to demonstrate improved functional mobility and increased leg strength.    Baseline: 23.84 sec Goal status: INITIAL    PLAN:  PT FREQUENCY: 2x/week  PT DURATION: 4 weeks  PLANNED INTERVENTIONS: 97164- PT Re-evaluation, 97110-Therapeutic exercises, 97530- Therapeutic activity, 97112- Neuromuscular re-education, 97535- Self Care, [id number]- Manual therapy, [id number]- Gait training, [phone]- Orthotic Fit/training, [phone]-  Canalith repositioning, [id number]- Aquatic Therapy, [phone]- Splinting, [phone]- Wound care (first 20 sq cm), 97598- Wound care (each additional 20 sq cm)Patient/Family education, Balance training, Stair training, Taping, Dry Needling, Joint mobilization, Joint manipulation, Spinal manipulation, Spinal mobilization, Scar mobilization, and DME instructions.   PLAN FOR NEXT SESSION: continue to progress LE strength and stability as well as core.  (per evaluation plan...check hip height; likely lumbar involvement, continue hip distraction techniques)  Greig KATHEE Fuse, PTA/CLT [facility] Ph: [phone] 10:17 AM, 09/24/23

## 2023-09-25 ENCOUNTER — Ambulatory Visit (HOSPITAL_COMMUNITY)

## 2023-09-25 DIAGNOSIS — G8929 Other chronic pain: Secondary | ICD-10-CM | POA: Diagnosis not present

## 2023-09-25 DIAGNOSIS — R262 Difficulty in walking, not elsewhere classified: Secondary | ICD-10-CM

## 2023-09-25 DIAGNOSIS — M25551 Pain in right hip: Secondary | ICD-10-CM | POA: Diagnosis not present

## 2023-09-25 NOTE — Therapy (Signed)
 OUTPATIENT PHYSICAL THERAPY LOWER EXTREMITY TREATMENT   Patient Name: Jacqueline Koch MRN: [id number] DOB:27-Aug-1995, 28 y.o., female Today's Date: 09/25/2023  END OF SESSION:  PT End of Session - 09/25/23 0717     Visit Number 4    Number of Visits 8    Date for PT Re-Evaluation 10/18/23    Authorization Type [place] Medicaid Prepaid    Authorization Time Period 6 visits 8/15-10/13    Authorization - Visit Number 3    Authorization - Number of Visits 6    Progress Note Due on Visit 6    PT Start Time 0718    PT Stop Time 0758    PT Time Calculation (min) 40 min    Activity Tolerance Patient tolerated treatment well    Behavior During Therapy [facility] for tasks assessed/performed           Past Medical History:  Diagnosis Date   Kidney stone    Nexplanon  removal 06/09/2014   Syncope    No past surgical history on file. Patient Active Problem List   Diagnosis Date Noted   Closed nondisplaced fracture of shaft of left clavicle 03/30/2020   Nondisplaced fracture of body of left scapula with routine healing 03/30/2020   Nexplanon  insertion 08/22/2015   Susceptible to varicella (non-immune), currently pregnant 12/13/2014   Marijuana use 12/08/2014   Chlamydia infection 01/20/2013   Adolescent scoliosis 12/01/2012   Abnormal vision screen 12/01/2012   Hearing deficit 12/01/2012   Scoliosis 11/02/2010   Back pain 11/02/2010    PCP: none  REFERRING PROVIDER: Burnetta Brunet, DO REFERRING DIAG: [phone] (ICD-10-CM) - Chronic right hip pain  THERAPY DIAG:  Pain in right hip  Difficulty in walking, not elsewhere classified  Rationale for Evaluation and Treatment: Rehabilitation  ONSET DATE: 4 months  SUBJECTIVE:   SUBJECTIVE STATEMENT: A little bit better 5/10 with walking; arrives with her daughter and with QC; sees Dr. Burnetta next Tues  Eval: Woke up one day and her right hip was hurting and has progressively got worse over time.  She is limping a significant amount  on arrival; arrives with her daughter.  Went to ED first; referred to Dr. Onesimo.  He did x-rays and gave prednisone  and that helped a little but then came right back; then referred to Dr. Burnetta.    PERTINENT HISTORY:  PAIN:  Are you having pain? Yes: NPRS scale: 9/10 with walking; 3-4 when sitting Pain location: right side groin and right leg down to knee Pain description: tight,  Aggravating factors: standing, walking Relieving factors: sitting down  PRECAUTIONS: None  RWEIGHT BEARING RESTRICTIONS: No  FALLS:  Has patient fallen in last 6 months? No  OCCUPATION: works at [employer]  PLOF: Independent  PATIENT GOALS: ready to walk normal  NEXT MD VISIT: next week sees Dr. Burnetta  OBJECTIVE:  Note: Objective measures were completed at Evaluation unless otherwise noted.  DIAGNOSTIC FINDINGS:  X-rays of the right hip were obtained in clinic today.  No acute injuries are noted.  No degenerative changes.  No evidence of AVN.  No bony lesions.   Impression: Negative right hip x-rays  Complete lumbar film x-rays including AP, lateral, flexion/extension views  were ordered and reviewed by myself today.  X-rays demonstrate a mild  thoracolumbar levoscoliosis.  There are 5 nonrib-bearing lumbar vertebra  with preserved intervertebral disc spacing.  No notable DDD or acute bony  abnormality noted.    PATIENT SURVEYS:  LEFS  Extreme difficulty/unable (0), Quite a bit  of difficulty (1), Moderate difficulty (2), Little difficulty (3), No difficulty (4) Survey date:    Any of your usual work, housework or school activities   2. Usual hobbies, recreational or sporting activities   3. Getting into/out of the bath   4. Walking between rooms   5. Putting on socks/shoes   6. Squatting    7. Lifting an object, like a bag of groceries from the floor   8. Performing light activities around your home   9. Performing heavy activities around your home   10. Getting into/out of a car   11.  Walking 2 blocks   12. Walking 1 mile   13. Going up/down 10 stairs (1 flight)   14. Standing for 1 hour   15.  sitting for 1 hour   16. Running on even ground   17. Running on uneven ground   18. Making sharp turns while running fast   19. Hopping    20. Rolling over in bed   Score total:  12/80; 15%     COGNITION: Overall cognitive status: Within functional limits for tasks assessed     SENSATION: WFL  EDEMA:  None noted   POSTURE: increased lumbar lordosis and weight shift left  PALPATION: Tender right greater troch; glute; piriformis  LOWER EXTREMITY ROM:  Active ROM Right 09/24/23 Left 09/24/23  Hip flexion 95 105  Hip extension    Hip abduction    Hip adduction    Hip internal rotation    Hip external rotation    Knee flexion    Knee extension    Ankle dorsiflexion    Ankle plantarflexion    Ankle inversion    Ankle eversion     (Blank rows = not tested)  LOWER EXTREMITY MMT:  MMT Right eval Left eval  Hip flexion 2 4+  Hip extension    Hip abduction    Hip adduction    Hip internal rotation    Hip external rotation    Knee flexion    Knee extension 4+ 5  Ankle dorsiflexion 4+ 5  Ankle plantarflexion    Ankle inversion    Ankle eversion       (Blank rows = not tested) LUMBAR ROM: *pain  Active  A/PROM  eval  Flexion Fingertips* to mid shin painful right anterior hip  Extension 70% available no pain  Right lateral flexion Fingertips to knee joint line  Left lateral flexion Fingertips to knee joint line  Right rotation   Left rotation    (Blank rows = not tested)   FUNCTIONAL TESTS:  5 times sit to stand: 23.84 using hands to assist up; leaning to the left Timed up and go (TUG): 22.21 sec heavy limp  GAIT: Distance walked: 50 ft in clinic Assistive device utilized: None Level of assistance: SBA Comments: heavy limp; improved with trial of using SPC  TREATMENT DATE:  09/25/23 Right Heel slide 2 x 10 DKTC with green physioball 2 x 10 Quadruped to child's pose with long axis distraction x 10 Quadruped right hip extension (unable to pick up foot off the mat) x10 Left Sidelying clam x 10 (limited range) Supine hip abduction x 10 Nustep seat 6 arms 8 x 5' to end treatment    09/24/23 Supine: hamstring measurement  bridge 2X10  SLR Lt 10X, Rt unable  Rt heelslides 10X  Rt SAQ 10X5  Pelvic tilts 20X5   Rt IR/ER 10X for ROM LTR rocking with physioball 10 reps bilaterally DKTC in supine on green theraball, 2 sets of 10 reps Sidelying clams 20X 3 each LE Standing lumbar extensions 10X   09/20/2023  Manual Therapy: -Short axis distraction of right hip joint, 5 bouts with therapist force with gait belt around proximal femur, in supine,  -PROM of right hip in supine, flexion/extension, IR and ER 10 reps for synovial fluid movement in joint Therapeutic Exercise: -Supine bridges 2 sets of 10 reps, 3 second holds, symptomatic, pt cued for max hip extension -Side lying Clamshells, 1 set of 10 reps bilaterally Neuromuscular Re-education: -PPT 1 set of 10 reps, 3 second holds, pr cued to remain in pain free ROM -LTR 1 set of 10 reps bilaterally, pt cued to remain in pain free ROM -DKTC in supine on green theraball, 2 sets of 10 reps, pt reports decreased pain with hip flexion -Hooklying, hip abduction isometric, 1 set of 10 reps, 5 second hold  09/18/23 physical therapy evaluation and HEP instructions    PATIENT EDUCATION:  Education details: Patient educated on exam findings, POC, scope of PT, HEP, and what to expect next visit. Person educated: Patient Education method: Explanation, Demonstration, and Handouts Education comprehension: verbalized understanding, returned demonstration, verbal cues required, and tactile cues required HOME EXERCISE PROGRAM: Access Code: [facility] URL:  [email or web address] Date: 09/18/2023 Prepared by: AP - Rehab  Exercises - Standing Lumbar Extension with Counter  - 8 x daily - 7 x weekly - 1 sets - 10 reps - Standing Lumbar Extension  - 8 x daily - 7 x weekly - 1 sets - 10 reps  Access Code: [email or web address] URL: [email or web address] Date: 09/20/2023 Prepared by: Lang Ada  Exercises - Supine Heel Slide with Strap  - 1 x daily - 7 x weekly - 3 sets - 10 reps - Supine Bridge  - 1 x daily - 7 x weekly - 3 sets - 10 reps - 3 hold - Supine Lower Trunk Rotation  - 1 x daily - 7 x weekly - 3 sets - 10 reps - Supine Posterior Pelvic Tilt  - 1 x daily - 7 x weekly - 3 sets - 10 reps - Hooklying Isometric Hip Abduction with Belt  - 1 x daily - 7 x weekly - 3 sets - 10 reps - 5 hold ASSESSMENT:  CLINICAL IMPRESSION: Today's session with focus on right hip mobility and strength.  She continues with a heavy limp and is now using a quad cane to assist however her limp is not quite as pronounced as at eval.  Patient would continue to benefit from skilled physical therapy for decreased R hip pain, increased endurance with ambulation, increased LE strength, and improved balance for improved quality of life, improved independence with gait training and continued progress towards therapy goals.   Eval: Patient is a 28 y.o. female who was seen today for physical therapy evaluation and treatment for M25.551,G89.29 (ICD-10-CM) -  Chronic right hip painM25.551,G89.29 (ICD-10-CM) - Chronic right hip pain. . Patient demonstrates muscle weakness, reduced ROM, and fascial restrictions which are likely contributing to symptoms of pain and are negatively impacting patient ability to perform ADLs and functional mobility tasks. Patient will benefit from skilled physical therapy services to address these deficits to reduce pain and improve level of function with ADLs and functional mobility tasks.   OBJECTIVE IMPAIRMENTS: Abnormal gait,  decreased activity tolerance, decreased balance, decreased knowledge of condition, decreased knowledge of use of DME, decreased mobility, difficulty walking, decreased ROM, decreased strength, increased fascial restrictions, impaired perceived functional ability, postural dysfunction, and pain.   ACTIVITY LIMITATIONS: carrying, lifting, bending, standing, sleeping, stairs, transfers, bed mobility, and locomotion level  PARTICIPATION LIMITATIONS: meal prep, cleaning, laundry, shopping, community activity, and occupation  REHAB POTENTIAL: Good  CLINICAL DECISION MAKING: Evolving/moderate complexity  EVALUATION COMPLEXITY: Moderate   GOALS: Goals reviewed with patient? Yes  SHORT TERM GOALS: Target date: 10/02/2023 patient will be independent with initial HEP  Baseline: Goal status: INITIAL  2.  Patient will report 50% improvement overall  Baseline:  Goal status: INITIAL LONG TERM GOALS: Target date: 10/18/2023   Patient will be independent in self management strategies to improve quality of life and functional outcomes.  Baseline:  Goal status: INITIAL  2.  Patient will report 80% improvement overall  Baseline:  Goal status: INITIAL  3.  Patient will improve LEFS score by 30 points to demonstrate improved perceived function  Baseline: 12/80 Goal status: INITIAL  4.  Patient will improve TUG score to 10 sec or less to demonstrate improved functional mobility and decreased fall risk  Baseline: 22.21 Goal status: INITIAL  5.  Patient will improve 5 times sit to stand score to 15 sec or less to demonstrate improved functional mobility and increased leg strength.    Baseline: 23.84 sec Goal status: INITIAL    PLAN:  PT FREQUENCY: 2x/week  PT DURATION: 4 weeks  PLANNED INTERVENTIONS: 97164- PT Re-evaluation, 97110-Therapeutic exercises, 97530- Therapeutic activity, 97112- Neuromuscular re-education, 97535- Self Care, 02859- Manual therapy, [id number]- Gait training,  [phone]- Orthotic Fit/training, [phone]- Canalith repositioning, [id number]- Aquatic Therapy, [phone]- Splinting, [phone]- Wound care (first 20 sq cm), 97598- Wound care (each additional 20 sq cm)Patient/Family education, Balance training, Stair training, Taping, Dry Needling, Joint mobilization, Joint manipulation, Spinal manipulation, Spinal mobilization, Scar mobilization, and DME instructions.   PLAN FOR NEXT SESSION: continue to progress LE strength and stability as well as core.  (per evaluation plan...check hip height; likely lumbar involvement, continue hip distraction techniques)  8:02 AM, 09/25/23 Sharina Petre Small Sharmila Wrobleski MPT [facility] [place] [phone] Ph:[phone]

## 2023-10-01 ENCOUNTER — Encounter: Payer: Self-pay | Admitting: Sports Medicine

## 2023-10-01 ENCOUNTER — Ambulatory Visit: Admitting: Sports Medicine

## 2023-10-01 DIAGNOSIS — M419 Scoliosis, unspecified: Secondary | ICD-10-CM | POA: Diagnosis not present

## 2023-10-01 DIAGNOSIS — M25551 Pain in right hip: Secondary | ICD-10-CM

## 2023-10-01 DIAGNOSIS — G8929 Other chronic pain: Secondary | ICD-10-CM | POA: Diagnosis not present

## 2023-10-01 DIAGNOSIS — R269 Unspecified abnormalities of gait and mobility: Secondary | ICD-10-CM

## 2023-10-01 DIAGNOSIS — R29898 Other symptoms and signs involving the musculoskeletal system: Secondary | ICD-10-CM | POA: Diagnosis not present

## 2023-10-01 NOTE — Progress Notes (Signed)
 [name] - 28 y.o. female MRN [id number]  Date of birth: 1995-05-02  Office Visit Note: Visit Date: 10/01/2023 PCP: Pcp, No Referred by: No ref. provider found  Subjective: Chief Complaint  Patient presents with   Right Hip - Follow-up   HPI: Jacqueline Koch is a pleasant 28 y.o. female who presents today for follow-up of chronic right hip pain as well as antalgic gait/limp.  Deshea states her pain is significantly improved, she only has very mild pain with walking and some stiffness when she first wakes in the morning, but overall she has a little pain over the hip/groin.  She still however is walking with a limp, this does not really cause her pain when she limps, but she feels like she still does not have the strength in the legs.  She has been doing formalized physical therapy as well as performing home exercises on a consistent basis at least once daily.  She does feel she is making progress with her strength and is limping less but still has a limp in her gait.  She has been using a cane at times which she finds helpful.   As a reminder she did have a greater trochanteric injection followed by a subsequent intra-articular hip injection on 7/1 and 7/15.  Pertinent ROS were reviewed with the patient and found to be negative unless otherwise specified above in HPI.   Assessment & Plan: Visit Diagnoses:  1. Chronic right hip pain   2. Weakness of right hip   3. Abnormal gait   4. Scoliosis of thoracolumbar spine, unspecified scoliosis type    Plan: Impression is chronic right hip pain with associated limp which has had significant improvement in pain but still having a limp/gait abnormality.  She did have intra-articular hip injection about 6 weeks ago and only has very limited pain about the hip with walking and mild pain/weakness within the hip/groin when first waking.  Her symptoms do suggest she may have a labral abnormality about the hip, but I am unsure why she still has a  functional gait abnormality.  She has no leg length discrepancy.  She does have a mild thoracolumbar scoliosis but this is not advanced enough to cause a significant gait abnormality and this has only been present since her hip pain began.  She otherwise neurologically is intact and has full muscle strength of her left leg and upper extremities. Given this, I would like to move forward with an MRI arthrogram of the right hip to evaluate the labrum and other intra-articular pathology.  She may use ibuprofen  as needed.  She will continue her PT and her home exercises.  If for some reason the MRI/MRI of the hip does not show significant pathology that would explain the above, we discussed other workup considerations including EMG/nerve conduction study of the lower extremities to rule out any muscle related abnormality causing her weakness/gait change.   Follow-up: Return for I will message her once MRI-A results to discuss next steps.   Meds & Orders: No orders of the defined types were placed in this encounter.   Orders Placed This Encounter  Procedures   MR Hip Right w/ contrast   Arthrogram     Procedures: No procedures performed      Clinical History: No specialty comments available.  She reports that she has been smoking cigarettes and cigars. She has never used smokeless tobacco. No results for input(s): HGBA1C, LABURIC in the last 8760 hours.  Objective:  Physical Exam  Gen: Well-appearing, in no acute distress; non-toxic CV: Well-perfused. Warm.  Resp: Breathing unlabored on room air; no wheezing. Psych: Fluid speech in conversation; appropriate affect; normal thought process  Ortho Exam - Right hip: Mild discomfort palpating deep within the anterior joint itself.  There is mild guarding with internal and external logroll although no bony restrictions.  Positive FADIR test, positive Stinchfield test, negative FABER test.  There is 3-4/5 weakness with resisted hip flexion as  well as hip abduction compared to full strength on the contralateral hip.  - Lumbar/Leg length: No tenderness of the lumbar spine.  There is a mild thoracolumbar scoliotic curve.  Negative straight leg raise bilaterally.  Leg lengths are symmetric when tested in the supine position.  - Gait: Patient walks with an obvious limp.  There is a degree of Trendelenburg to the right side with diminished right hip swing.  The left leg does swing outwardly to a mild to moderate degree.  Imaging:  XR Lumbar Spine Complete Complete lumbar film x-rays including AP, lateral, flexion/extension views  were ordered and reviewed by myself today.  X-rays demonstrate a mild  thoracolumbar levoscoliosis.  There are 5 nonrib-bearing lumbar vertebra  with preserved intervertebral disc spacing.  No notable DDD or acute bony  abnormality noted.  Narrative & Impression  X-rays of the right hip were obtained in clinic today.  No acute injuries are noted.  No degenerative changes.  No evidence of AVN.  No bony lesions.   Impression: Negative right hip x-rays    Past Medical/Family/Surgical/Social History: Medications & Allergies reviewed per EMR, new medications updated. Patient Active Problem List   Diagnosis Date Noted   Closed nondisplaced fracture of shaft of left clavicle 03/30/2020   Nondisplaced fracture of body of left scapula with routine healing 03/30/2020   Nexplanon  insertion 08/22/2015   Susceptible to varicella (non-immune), currently pregnant 12/13/2014   Marijuana use 12/08/2014   Chlamydia infection 01/20/2013   Adolescent scoliosis 12/01/2012   Abnormal vision screen 12/01/2012   Hearing deficit 12/01/2012   Scoliosis 11/02/2010   Back pain 11/02/2010   Past Medical History:  Diagnosis Date   Kidney stone    Nexplanon  removal 06/09/2014   Syncope    Family History  Problem Relation Age of Onset   Asthma Brother    Arthritis Paternal Grandmother    Heart Problems Paternal  Grandmother    Crohn's disease Maternal Grandmother    History reviewed. No pertinent surgical history. Social History   Occupational History   Occupation: Consulting civil engineer  Tobacco Use   Smoking status: Light Smoker    Types: Cigarettes, Cigars   Smokeless tobacco: Never   Tobacco comments:    1 cigarette daily   Vaping Use   Vaping status: Never Used  Substance and Sexual Activity   Alcohol use: Yes    Alcohol/week: 0.0 standard drinks of alcohol    Comment: social   Drug use: No   Sexual activity: Not Currently    Birth control/protection: Implant   I spent 33 minutes in the care of the patient today including face-to-face time, preparation to see the patient, as well as review of previous hip and lumbar spine x-ray imaging, time spent for gait analysis, review of previous physical therapy notes and discussion on need for MRI or other conservative treatments for the above diagnoses.   Lonell Sprang, DO Primary Care Sports Medicine Physician  [facility] - Orthopedics  This note was dictated using Dragon naturally speaking  software and may contain errors in syntax, spelling, or content which have not been identified prior to signing this note.

## 2023-10-01 NOTE — Progress Notes (Signed)
 Patient says that she is still limping, but she is overall doing better. She says that her pain is much improved, although she does still have some pain with walking and stiffness when she first wakes up in the morning. She says that she does feel weak, but has gained strength in physical therapy. She has been going to physical therapy twice a week, and doing her exercises daily on her own. She also uses a cane to walk, per recommendation from physical therapy, although she is not using that today.

## 2023-10-02 ENCOUNTER — Ambulatory Visit (HOSPITAL_COMMUNITY): Admitting: Physical Therapy

## 2023-10-02 DIAGNOSIS — R262 Difficulty in walking, not elsewhere classified: Secondary | ICD-10-CM

## 2023-10-02 DIAGNOSIS — G8929 Other chronic pain: Secondary | ICD-10-CM | POA: Diagnosis not present

## 2023-10-02 DIAGNOSIS — M25551 Pain in right hip: Secondary | ICD-10-CM | POA: Diagnosis not present

## 2023-10-02 NOTE — Therapy (Signed)
 OUTPATIENT PHYSICAL THERAPY LOWER EXTREMITY TREATMENT   Patient Name: Jacqueline Koch MRN: [id number] DOB:April 28, 1995, 28 y.o., female Today's Date: 10/02/2023  END OF SESSION:  PT End of Session - 10/02/23 0718     Visit Number 5    Number of Visits 8    Date for PT Re-Evaluation 10/18/23    Authorization Type [place] [employer] Prepaid    Authorization Time Period 6 visits 8/15-10/13    Authorization - Visit Number 4    Authorization - Number of Visits 6    Progress Note Due on Visit 6    PT Start Time 0718    PT Stop Time 0756    PT Time Calculation (min) 38 min    Activity Tolerance Patient tolerated treatment well    Behavior During Therapy [facility] for tasks assessed/performed           Past Medical History:  Diagnosis Date   Kidney stone    Nexplanon  removal 06/09/2014   Syncope    No past surgical history on file. Patient Active Problem List   Diagnosis Date Noted   Closed nondisplaced fracture of shaft of left clavicle 03/30/2020   Nondisplaced fracture of body of left scapula with routine healing 03/30/2020   Nexplanon  insertion 08/22/2015   Susceptible to varicella (non-immune), currently pregnant 12/13/2014   Marijuana use 12/08/2014   Chlamydia infection 01/20/2013   Adolescent scoliosis 12/01/2012   Abnormal vision screen 12/01/2012   Hearing deficit 12/01/2012   Scoliosis 11/02/2010   Back pain 11/02/2010    PCP: none  REFERRING PROVIDER: Burnetta Brunet, DO REFERRING DIAG: [phone] (ICD-10-CM) - Chronic right hip pain  THERAPY DIAG:  Pain in right hip  Difficulty in walking, not elsewhere classified  Rationale for Evaluation and Treatment: Rehabilitation  ONSET DATE: 4 months  SUBJECTIVE:   SUBJECTIVE STATEMENT: A reports a little bit better today at 3/10 with walking and arrives today without AD.  Went back to MD yesterday and he is ordering an MRI.  Pt states MD was surprised that she was still limping.  Eval: Woke up one day and her right  hip was hurting and has progressively got worse over time.  She is limping a significant amount on arrival; arrives with her daughter.  Went to ED first; referred to Dr. Onesimo.  He did x-rays and gave prednisone  and that helped a little but then came right back; then referred to Dr. Burnetta.    PERTINENT HISTORY:  PAIN:  Are you having pain? Yes: NPRS scale: 9/10 with walking; 3-4 when sitting Pain location: right side groin and right leg down to knee Pain description: tight,  Aggravating factors: standing, walking Relieving factors: sitting down  PRECAUTIONS: None  RWEIGHT BEARING RESTRICTIONS: No  FALLS:  Has patient fallen in last 6 months? No  OCCUPATION: works at [employer]  PLOF: Independent  PATIENT GOALS: ready to walk normal  NEXT MD VISIT: next week sees Dr. Burnetta  OBJECTIVE:  Note: Objective measures were completed at Evaluation unless otherwise noted.  DIAGNOSTIC FINDINGS:  X-rays of the right hip were obtained in clinic today.  No acute injuries are noted.  No degenerative changes.  No evidence of AVN.  No bony lesions.   Impression: Negative right hip x-rays  Complete lumbar film x-rays including AP, lateral, flexion/extension views  were ordered and reviewed by myself today.  X-rays demonstrate a mild  thoracolumbar levoscoliosis.  There are 5 nonrib-bearing lumbar vertebra  with preserved intervertebral disc spacing.  No notable DDD  or acute bony  abnormality noted.    PATIENT SURVEYS:  LEFS  Extreme difficulty/unable (0), Quite a bit of difficulty (1), Moderate difficulty (2), Little difficulty (3), No difficulty (4) Survey date:    Any of your usual work, housework or school activities   2. Usual hobbies, recreational or sporting activities   3. Getting into/out of the bath   4. Walking between rooms   5. Putting on socks/shoes   6. Squatting    7. Lifting an object, like a bag of groceries from the floor   8. Performing light activities around your  home   9. Performing heavy activities around your home   10. Getting into/out of a car   11. Walking 2 blocks   12. Walking 1 mile   13. Going up/down 10 stairs (1 flight)   14. Standing for 1 hour   15.  sitting for 1 hour   16. Running on even ground   17. Running on uneven ground   18. Making sharp turns while running fast   19. Hopping    20. Rolling over in bed   Score total:  12/80; 15%     COGNITION: Overall cognitive status: Within functional limits for tasks assessed     SENSATION: WFL  EDEMA:  None noted   POSTURE: increased lumbar lordosis and weight shift left  PALPATION: Tender right greater troch; glute; piriformis  LOWER EXTREMITY ROM:  Active ROM Right 09/24/23 Left 09/24/23  Hip flexion 95 105  Hip extension    Hip abduction    Hip adduction    Hip internal rotation    Hip external rotation    Knee flexion    Knee extension    Ankle dorsiflexion    Ankle plantarflexion    Ankle inversion    Ankle eversion     (Blank rows = not tested)  LOWER EXTREMITY MMT:  MMT Right eval Left eval  Hip flexion 2 4+  Hip extension    Hip abduction    Hip adduction    Hip internal rotation    Hip external rotation    Knee flexion    Knee extension 4+ 5  Ankle dorsiflexion 4+ 5  Ankle plantarflexion    Ankle inversion    Ankle eversion       (Blank rows = not tested) LUMBAR ROM: *pain  Active  A/PROM  eval  Flexion Fingertips* to mid shin painful right anterior hip  Extension 70% available no pain  Right lateral flexion Fingertips to knee joint line  Left lateral flexion Fingertips to knee joint line  Right rotation   Left rotation    (Blank rows = not tested)   FUNCTIONAL TESTS:  5 times sit to stand: 23.84 using hands to assist up; leaning to the left Timed up and go (TUG): 22.21 sec heavy limp  GAIT: Distance walked: 50 ft in clinic Assistive device utilized: None Level of assistance: SBA Comments: heavy limp; improved with  trial of using SPC  TREATMENT DATE:   10/02/23 Nustep seat 6 UE/LE 5 minutes level 3 Standing:  heelraises 10X, toeraises 10X  Hip abduction 10X  Hip extension 10X  Alternating march 10X  Lunge onto 4 step 10X each LE  Hamstring stretch bil 5X20 onto 12 step Seated LAQ Rt 10X5 holds Sit to stands 10X no UE Ambulation without AD and without limp 200 feet  09/25/23 Right Heel slide 2 x 10 DKTC with green physioball 2 x 10 Quadruped to child's pose with long axis distraction x 10 Quadruped right hip extension (unable to pick up foot off the mat) x10 Left Sidelying clam x 10 (limited range) Supine hip abduction x 10 Nustep seat 6 arms 8 x 5' to end treatment    09/24/23 Supine: hamstring measurement  bridge 2X10  SLR Lt 10X, Rt unable  Rt heelslides 10X  Rt SAQ 10X5  Pelvic tilts 20X5   Rt IR/ER 10X for ROM LTR rocking with physioball 10 reps bilaterally DKTC in supine on green theraball, 2 sets of 10 reps Sidelying clams 20X 3 each LE Standing lumbar extensions 10X   09/20/2023  Manual Therapy: -Short axis distraction of right hip joint, 5 bouts with therapist force with gait belt around proximal femur, in supine,  -PROM of right hip in supine, flexion/extension, IR and ER 10 reps for synovial fluid movement in joint Therapeutic Exercise: -Supine bridges 2 sets of 10 reps, 3 second holds, symptomatic, pt cued for max hip extension -Side lying Clamshells, 1 set of 10 reps bilaterally Neuromuscular Re-education: -PPT 1 set of 10 reps, 3 second holds, pr cued to remain in pain free ROM -LTR 1 set of 10 reps bilaterally, pt cued to remain in pain free ROM -DKTC in supine on green theraball, 2 sets of 10 reps, pt reports decreased pain with hip flexion -Hooklying, hip abduction isometric, 1 set of 10 reps, 5 second hold  09/18/23 physical  therapy evaluation and HEP instructions    PATIENT EDUCATION:  Education details: Patient educated on exam findings, POC, scope of PT, HEP, and what to expect next visit. Person educated: Patient Education method: Explanation, Demonstration, and Handouts Education comprehension: verbalized understanding, returned demonstration, verbal cues required, and tactile cues required HOME EXERCISE PROGRAM: Access Code: [facility] URL: [email or web address] Date: 09/18/2023 Prepared by: AP - Rehab  Exercises - Standing Lumbar Extension with Counter  - 8 x daily - 7 x weekly - 1 sets - 10 reps - Standing Lumbar Extension  - 8 x daily - 7 x weekly - 1 sets - 10 reps  Access Code: [email or web address] URL: [email or web address] Date: 09/20/2023 Prepared by: Lang Ada  Exercises - Supine Heel Slide with Strap  - 1 x daily - 7 x weekly - 3 sets - 10 reps - Supine Bridge  - 1 x daily - 7 x weekly - 3 sets - 10 reps - 3 hold - Supine Lower Trunk Rotation  - 1 x daily - 7 x weekly - 3 sets - 10 reps - Supine Posterior Pelvic Tilt  - 1 x daily - 7 x weekly - 3 sets - 10 reps - Hooklying Isometric Hip Abduction with Belt  - 1 x daily - 7 x weekly - 3 sets - 10 reps - 5 hold ASSESSMENT:  CLINICAL IMPRESSION: Pt comes today without AD however still presenting with antalgia. Began with warmup on nustep and began standing exercises today.  Pt able to complete all exercises bilaterally and without any c/o pain or pain behaviors.  Was reluctant to stand on Rt LE to complete Lt hamstring stretch, however able to do so and no issues.  Worked  on normalizing gait as more of a habit now than because of pain.  Pt with extreme difficulty increasing WB time on Rt and keeping body from shifting Rt when advancing Lt.  Pt instructed to take larger, heel to toe steps and is to work on this at home as well.  Patient will continue to benefit from skilled physical therapy for decreased R hip pain, increased  endurance with ambulation, increased LE strength, and improved balance for improved quality of life, improved independence with gait training and continued progress towards therapy goals.   Eval: Patient is a 28 y.o. female who was seen today for physical therapy evaluation and treatment for M25.551,G89.29 (ICD-10-CM) - Chronic right hip painM25.551,G89.29 (ICD-10-CM) - Chronic right hip pain. . Patient demonstrates muscle weakness, reduced ROM, and fascial restrictions which are likely contributing to symptoms of pain and are negatively impacting patient ability to perform ADLs and functional mobility tasks. Patient will benefit from skilled physical therapy services to address these deficits to reduce pain and improve level of function with ADLs and functional mobility tasks.   OBJECTIVE IMPAIRMENTS: Abnormal gait, decreased activity tolerance, decreased balance, decreased knowledge of condition, decreased knowledge of use of DME, decreased mobility, difficulty walking, decreased ROM, decreased strength, increased fascial restrictions, impaired perceived functional ability, postural dysfunction, and pain.   ACTIVITY LIMITATIONS: carrying, lifting, bending, standing, sleeping, stairs, transfers, bed mobility, and locomotion level  PARTICIPATION LIMITATIONS: meal prep, cleaning, laundry, shopping, community activity, and occupation  REHAB POTENTIAL: Good  CLINICAL DECISION MAKING: Evolving/moderate complexity  EVALUATION COMPLEXITY: Moderate   GOALS: Goals reviewed with patient? Yes  SHORT TERM GOALS: Target date: 10/02/2023 patient will be independent with initial HEP  Baseline: Goal status: INITIAL  2.  Patient will report 50% improvement overall  Baseline:  Goal status: INITIAL LONG TERM GOALS: Target date: 10/18/2023   Patient will be independent in self management strategies to improve quality of life and functional outcomes.  Baseline:  Goal status: INITIAL  2.  Patient  will report 80% improvement overall  Baseline:  Goal status: INITIAL  3.  Patient will improve LEFS score by 30 points to demonstrate improved perceived function  Baseline: 12/80 Goal status: INITIAL  4.  Patient will improve TUG score to 10 sec or less to demonstrate improved functional mobility and decreased fall risk  Baseline: 22.21 Goal status: INITIAL  5.  Patient will improve 5 times sit to stand score to 15 sec or less to demonstrate improved functional mobility and increased leg strength.    Baseline: 23.84 sec Goal status: INITIAL    PLAN:  PT FREQUENCY: 2x/week  PT DURATION: 4 weeks  PLANNED INTERVENTIONS: 97164- PT Re-evaluation, 97110-Therapeutic exercises, 97530- Therapeutic activity, 97112- Neuromuscular re-education, 97535- Self Care, [id number]- Manual therapy, [id number]- Gait training, [phone]- Orthotic Fit/training, [phone]- Canalith repositioning, [id number]- Aquatic Therapy, [phone]- Splinting, [phone]- Wound care (first 20 sq cm), 97598- Wound care (each additional 20 sq cm)Patient/Family education, Balance training, Stair training, Taping, Dry Needling, Joint mobilization, Joint manipulation, Spinal manipulation, Spinal mobilization, Scar mobilization, and DME instructions.   PLAN FOR NEXT SESSION: continue to progress LE strength and stability as well as core.  (per evaluation plan...check hip height; likely lumbar involvement, continue hip distraction techniques)  8:23 AM, 10/02/23 Vitor Overbaugh Small Lynch MPT [facility] [place] [phone] Ph:[phone]

## 2023-10-04 ENCOUNTER — Ambulatory Visit (HOSPITAL_COMMUNITY)

## 2023-10-04 DIAGNOSIS — R262 Difficulty in walking, not elsewhere classified: Secondary | ICD-10-CM | POA: Diagnosis not present

## 2023-10-04 DIAGNOSIS — M25551 Pain in right hip: Secondary | ICD-10-CM | POA: Diagnosis not present

## 2023-10-04 DIAGNOSIS — G8929 Other chronic pain: Secondary | ICD-10-CM | POA: Diagnosis not present

## 2023-10-04 NOTE — Therapy (Signed)
 OUTPATIENT PHYSICAL THERAPY LOWER EXTREMITY TREATMENT   Patient Name: Jacqueline Koch MRN: [id number] DOB:05-24-1995, 28 y.o., female Today's Date: 10/04/2023  END OF SESSION:  PT End of Session - 10/04/23 0849     Visit Number 6    Number of Visits 8    Date for PT Re-Evaluation 10/18/23    Authorization Type [place] Medicaid Prepaid    Authorization Time Period 6 visits 8/15-10/13    Authorization - Visit Number 5    Authorization - Number of Visits 6    Progress Note Due on Visit 6    PT Start Time 0842    PT Stop Time 0928    PT Time Calculation (min) 46 min    Activity Tolerance Patient tolerated treatment well    Behavior During Therapy [facility] for tasks assessed/performed            Past Medical History:  Diagnosis Date   Kidney stone    Nexplanon  removal 06/09/2014   Syncope    No past surgical history on file. Patient Active Problem List   Diagnosis Date Noted   Closed nondisplaced fracture of shaft of left clavicle 03/30/2020   Nondisplaced fracture of body of left scapula with routine healing 03/30/2020   Nexplanon  insertion 08/22/2015   Susceptible to varicella (non-immune), currently pregnant 12/13/2014   Marijuana use 12/08/2014   Chlamydia infection 01/20/2013   Adolescent scoliosis 12/01/2012   Abnormal vision screen 12/01/2012   Hearing deficit 12/01/2012   Scoliosis 11/02/2010   Back pain 11/02/2010    PCP: none  REFERRING PROVIDER: Burnetta Brunet, DO REFERRING DIAG: [phone] (ICD-10-CM) - Chronic right hip pain  THERAPY DIAG:  Pain in right hip  Difficulty in walking, not elsewhere classified  Rationale for Evaluation and Treatment: Rehabilitation  ONSET DATE: 4 months  SUBJECTIVE:   SUBJECTIVE STATEMENT: A reports had a little back pain this morning but overall feeling a little better in the hip. Left leg never hurts and right hip is mostly just hurting sometimes but overall it's getting better.   MD visit: 10/01/23 - Plan: Impression  is chronic right hip pain with associated limp which has had significant improvement in pain but still having a limp/gait abnormality.  She did have intra-articular hip injection about 6 weeks ago and only has very limited pain about the hip with walking and mild pain/weakness within the hip/groin when first waking.  Her symptoms do suggest she may have a labral abnormality about the hip, but I am unsure why she still has a functional gait abnormality.  She has no leg length discrepancy.  She does have a mild thoracolumbar scoliosis but this is not advanced enough to cause a significant gait abnormality and this has only been present since her hip pain began.  She otherwise neurologically is intact and has full muscle strength of her left leg and upper extremities. Given this, I would like to move forward with an MRI arthrogram of the right hip to evaluate the labrum and other intra-articular pathology.  She may use ibuprofen  as needed.  She will continue her PT and her home exercises.   If for some reason the MRI/MRI of the hip does not show significant pathology that would explain the above, we discussed other workup considerations including EMG/nerve conduction study of the lower extremities to rule out any muscle related abnormality causing her weakness/gait change.  Eval: Woke up one day and her right hip was hurting and has progressively got worse over time.  She  is limping a significant amount on arrival; arrives with her daughter.  Went to ED first; referred to Dr. Onesimo.  He did x-rays and gave prednisone  and that helped a little but then came right back; then referred to Dr. Burnetta.    PERTINENT HISTORY:  PAIN:  Are you having pain? Yes: NPRS scale: 9/10 with walking; 3-4 when sitting Pain location: right side groin and right leg down to knee Pain description: tight,  Aggravating factors: standing, walking Relieving factors: sitting down  PRECAUTIONS: None  RWEIGHT BEARING RESTRICTIONS:  No  FALLS:  Has patient fallen in last 6 months? No  OCCUPATION: works at [employer]  PLOF: Independent  PATIENT GOALS: ready to walk normal  NEXT MD VISIT: next week sees Dr. Burnetta  OBJECTIVE:  Note: Objective measures were completed at Evaluation unless otherwise noted.  DIAGNOSTIC FINDINGS:  X-rays of the right hip were obtained in clinic today.  No acute injuries are noted.  No degenerative changes.  No evidence of AVN.  No bony lesions.   Impression: Negative right hip x-rays  Complete lumbar film x-rays including AP, lateral, flexion/extension views  were ordered and reviewed by myself today.  X-rays demonstrate a mild  thoracolumbar levoscoliosis.  There are 5 nonrib-bearing lumbar vertebra  with preserved intervertebral disc spacing.  No notable DDD or acute bony  abnormality noted.    PATIENT SURVEYS:  LEFS  Extreme difficulty/unable (0), Quite a bit of difficulty (1), Moderate difficulty (2), Little difficulty (3), No difficulty (4) Survey date:    Score total:  12/80; 15%     COGNITION: Overall cognitive status: Within functional limits for tasks assessed     SENSATION: WFL  EDEMA:  None noted   POSTURE: increased lumbar lordosis and weight shift left  PALPATION: Tender right greater troch; glute; piriformis  LOWER EXTREMITY ROM:  Active ROM Right 09/24/23 Left 09/24/23  Hip flexion 95 105  Hip extension    Hip abduction    Hip adduction    Hip internal rotation 35 - end range painful  WFL  Hip external rotation *Restricted secondary to pain - 10 degrees WFL  Knee flexion    Knee extension    Ankle dorsiflexion    Ankle plantarflexion    Ankle inversion    Ankle eversion     (Blank rows = not tested)  LOWER EXTREMITY MMT:  MMT Right eval Left eval  Hip flexion 2 4+  Hip extension    Hip abduction    Hip adduction    Hip internal rotation    Hip external rotation    Knee flexion    Knee extension 4+ 5  Ankle dorsiflexion 4+ 5  Ankle  plantarflexion    Ankle inversion    Ankle eversion       (Blank rows = not tested) LUMBAR ROM: *pain  Active  A/PROM  eval  Flexion Fingertips* to mid shin painful right anterior hip  Extension 70% available no pain  Right lateral flexion Fingertips to knee joint line  Left lateral flexion Fingertips to knee joint line  Right rotation   Left rotation    (Blank rows = not tested)   FUNCTIONAL TESTS:  5 times sit to stand: 23.84 using hands to assist up; leaning to the left Timed up and go (TUG): 22.21 sec heavy limp  GAIT: Distance walked: 50 ft in clinic Assistive device utilized: None Level of assistance: SBA Comments: heavy limp; improved with trial of using SPC  TREATMENT DATE:  10/04/23 Manual Therapy: - Long axis distraction of right hip joint; bouts for relief prior to therapeutic ex - PROM R hip FL, IR/ER w/ cues for relaxation for improving mobility tolerance - Prone quad / hip flexor stretch w/ passive OP Therapeutic Exercise: - Nustep seat 6 UE/LE 5 minutes level 3 - Seated marching w/ cues for core engagement and upright posturing  Neuromuscular Re-education: - Supine IR/ER for mm retraining - Prone heel squeeze for  engagement of gluts in prone to improve mm activation  10/02/23 Nustep seat 6 UE/LE 5 minutes level 3 Standing:  heelraises 10X, toeraises 10X  Hip abduction 10X  Hip extension 10X  Alternating march 10X  Lunge onto 4 step 10X each LE  Hamstring stretch bil 5X20 onto 12 step Seated LAQ Rt 10X5 holds Sit to stands 10X no UE Ambulation without AD and without limp 200 feet  09/25/23 Right Heel slide 2 x 10 DKTC with green physioball 2 x 10 Quadruped to child's pose with long axis distraction x 10 Quadruped right hip extension (unable to pick up foot off the mat) x10 Left Sidelying clam x 10 (limited  range) Supine hip abduction x 10 Nustep seat 6 arms 8 x 5' to end treatment  09/24/23 Supine: hamstring measurement  bridge 2X10  SLR Lt 10X, Rt unable  Rt heelslides 10X  Rt SAQ 10X5  Pelvic tilts 20X5   Rt IR/ER 10X for ROM LTR rocking with physioball 10 reps bilaterally DKTC in supine on green theraball, 2 sets of 10 reps Sidelying clams 20X 3 each LE Standing lumbar extensions 10X  09/20/2023  Manual Therapy: -Short axis distraction of right hip joint, 5 bouts with therapist force with gait belt around proximal femur, in supine,  -PROM of right hip in supine, flexion/extension, IR and ER 10 reps for synovial fluid movement in joint Therapeutic Exercise: -Supine bridges 2 sets of 10 reps, 3 second holds, symptomatic, pt cued for max hip extension -Side lying Clamshells, 1 set of 10 reps bilaterally Neuromuscular Re-education: -PPT 1 set of 10 reps, 3 second holds, pr cued to remain in pain free ROM -LTR 1 set of 10 reps bilaterally, pt cued to remain in pain free ROM -DKTC in supine on green theraball, 2 sets of 10 reps, pt reports decreased pain with hip flexion -Hooklying, hip abduction isometric, 1 set of 10 reps, 5 second hold  09/18/23 physical therapy evaluation and HEP instructions    PATIENT EDUCATION:  Education details: Patient educated on exam findings, POC, scope of PT, HEP, and what to expect next visit. Person educated: Patient Education method: Explanation, Demonstration, and Handouts Education comprehension: verbalized understanding, returned demonstration, verbal cues required, and tactile cues required HOME EXERCISE PROGRAM: Access Code: [facility] URL: [email or web address] Date: 09/18/2023 Prepared by: [facility] - Rehab  Exercises - Standing Lumbar Extension with Counter  - 8 x daily - 7 x weekly - 1 sets - 10 reps - Standing Lumbar Extension  - 8 x daily - 7 x weekly - 1 sets - 10 reps  Access Code: [email or web address] URL:  [email or web address] Date: 09/20/2023 Prepared by: Lang Ada  Exercises - Supine Heel Slide with Strap  - 1 x daily - 7 x weekly - 3 sets - 10 reps - Supine Bridge  - 1 x daily - 7 x weekly - 3 sets - 10 reps - 3 hold - Supine Lower Trunk Rotation  - 1 x daily - 7 x weekly - 3 sets - 10 reps -  Supine Posterior Pelvic Tilt  - 1 x daily - 7 x weekly - 3 sets - 10 reps - Hooklying Isometric Hip Abduction with Belt  - 1 x daily - 7 x weekly - 3 sets - 10 reps - 5 hold ASSESSMENT:  CLINICAL IMPRESSION: Pt comes today without AD and continued antalgic gait noted. Warm up with nustep and addressed pain with manual techniques as well as strategies for progressing strength and NM re-training at hip mm activation. Continue to reinforce and educate on gait training with core for heel strike and ER stepping. Patient will continue to benefit from skilled physical therapy for decreased R hip pain, increased endurance with ambulation, increased LE strength, and improved balance for improved quality of life, improved independence with gait training and continued progress towards therapy goals.   Eval: Patient is a 28 y.o. female who was seen today for physical therapy evaluation and treatment for M25.551,G89.29 (ICD-10-CM) - Chronic right hip painM25.551,G89.29 (ICD-10-CM) - Chronic right hip pain. . Patient demonstrates muscle weakness, reduced ROM, and fascial restrictions which are likely contributing to symptoms of pain and are negatively impacting patient ability to perform ADLs and functional mobility tasks. Patient will benefit from skilled physical therapy services to address these deficits to reduce pain and improve level of function with ADLs and functional mobility tasks.   OBJECTIVE IMPAIRMENTS: Abnormal gait, decreased activity tolerance, decreased balance, decreased knowledge of condition, decreased knowledge of use of DME, decreased mobility, difficulty walking, decreased ROM,  decreased strength, increased fascial restrictions, impaired perceived functional ability, postural dysfunction, and pain.   ACTIVITY LIMITATIONS: carrying, lifting, bending, standing, sleeping, stairs, transfers, bed mobility, and locomotion level  PARTICIPATION LIMITATIONS: meal prep, cleaning, laundry, shopping, community activity, and occupation  REHAB POTENTIAL: Good  CLINICAL DECISION MAKING: Evolving/moderate complexity  EVALUATION COMPLEXITY: Moderate   GOALS: Goals reviewed with patient? Yes  SHORT TERM GOALS: Target date: 10/02/2023 patient will be independent with initial HEP  Baseline: Goal status: INITIAL  2.  Patient will report 50% improvement overall  Baseline:  Goal status: INITIAL LONG TERM GOALS: Target date: 10/18/2023   Patient will be independent in self management strategies to improve quality of life and functional outcomes.  Baseline:  Goal status: INITIAL  2.  Patient will report 80% improvement overall  Baseline:  Goal status: INITIAL  3.  Patient will improve LEFS score by 30 points to demonstrate improved perceived function  Baseline: 12/80 Goal status: INITIAL  4.  Patient will improve TUG score to 10 sec or less to demonstrate improved functional mobility and decreased fall risk  Baseline: 22.21 Goal status: INITIAL  5.  Patient will improve 5 times sit to stand score to 15 sec or less to demonstrate improved functional mobility and increased leg strength.    Baseline: 23.84 sec Goal status: INITIAL    PLAN:  PT FREQUENCY: 2x/week  PT DURATION: 4 weeks  PLANNED INTERVENTIONS: [id number]- PT Re-evaluation, 97110-Therapeutic exercises, 97530- Therapeutic activity, 97112- Neuromuscular re-education, 97535- Self Care, [id number]- Manual therapy, [id number]- Gait training, [phone]- Orthotic Fit/training, [phone]- Canalith repositioning, [id number]- Aquatic Therapy, [phone]- Splinting, [phone]- Wound care (first 20 sq cm), 97598- Wound care (each additional  20 sq cm)Patient/Family education, Balance training, Stair training, Taping, Dry Needling, Joint mobilization, Joint manipulation, Spinal manipulation, Spinal mobilization, Scar mobilization, and DME instructions.   PLAN FOR NEXT SESSION: posterior hip stretching/manual techniques for improving IR/ER in pain free range; manual distraction; continue to progress LE strength and stability as well as core  10:31 AM,  10/04/23 Lamarr LITTIE Citrin PT, DPT [facility] [phone] office

## 2023-10-09 ENCOUNTER — Ambulatory Visit (HOSPITAL_COMMUNITY): Attending: Sports Medicine

## 2023-10-09 DIAGNOSIS — M25551 Pain in right hip: Secondary | ICD-10-CM | POA: Insufficient documentation

## 2023-10-09 DIAGNOSIS — R262 Difficulty in walking, not elsewhere classified: Secondary | ICD-10-CM | POA: Insufficient documentation

## 2023-10-09 NOTE — Therapy (Signed)
 OUTPATIENT PHYSICAL THERAPY LOWER EXTREMITY TREATMENT/PROGRESS NOTE Progress Note Reporting Period 09/18/2023 to 10/09/23  See note below for Objective Data and Assessment of Progress/Goals.       Patient Name: Jacqueline Koch MRN: [id number] DOB:1995/06/17, 28 y.o., female Today's Date: 10/09/2023  END OF SESSION:  PT End of Session - 10/09/23 0838     Visit Number 7    Number of Visits 16    Date for PT Re-Evaluation 11/08/23    Authorization Type [place] Medicaid Prepaid    Authorization Time Period 6 visits 8/15-10/13; seeking new authorization on 10/09/23    Authorization - Visit Number 6    Authorization - Number of Visits 6    Progress Note Due on Visit 6    PT Start Time 0839    PT Stop Time 0920    PT Time Calculation (min) 41 min    Activity Tolerance Patient tolerated treatment well    Behavior During Therapy [facility] for tasks assessed/performed            Past Medical History:  Diagnosis Date   Kidney stone    Nexplanon  removal 06/09/2014   Syncope    No past surgical history on file. Patient Active Problem List   Diagnosis Date Noted   Closed nondisplaced fracture of shaft of left clavicle 03/30/2020   Nondisplaced fracture of body of left scapula with routine healing 03/30/2020   Nexplanon  insertion 08/22/2015   Susceptible to varicella (non-immune), currently pregnant 12/13/2014   Marijuana use 12/08/2014   Chlamydia infection 01/20/2013   Adolescent scoliosis 12/01/2012   Abnormal vision screen 12/01/2012   Hearing deficit 12/01/2012   Scoliosis 11/02/2010   Back pain 11/02/2010    PCP: none  REFERRING PROVIDER: Burnetta Brunet, DO REFERRING DIAG: [id number] (ICD-10-CM) - Chronic right hip pain  THERAPY DIAG:  Pain in right hip - Plan: PT plan of care cert/re-cert  Difficulty in walking, not elsewhere classified - Plan: PT plan of care cert/re-cert  Rationale for Evaluation and Treatment: Rehabilitation  ONSET DATE: 4 months  SUBJECTIVE:    SUBJECTIVE STATEMENT: Had to take some ibuprophen this morning; think likely due to cool weather.  Now is waiting on MRI to be scheduled.  She reports overall she is maybe 60% better overall.    MD visit: 10/01/23 - Plan: Impression is chronic right hip pain with associated limp which has had significant improvement in pain but still having a limp/gait abnormality.  She did have intra-articular hip injection about 6 weeks ago and only has very limited pain about the hip with walking and mild pain/weakness within the hip/groin when first waking.  Her symptoms do suggest she may have a labral abnormality about the hip, but I am unsure why she still has a functional gait abnormality.  She has no leg length discrepancy.  She does have a mild thoracolumbar scoliosis but this is not advanced enough to cause a significant gait abnormality and this has only been present since her hip pain began.  She otherwise neurologically is intact and has full muscle strength of her left leg and upper extremities. Given this, I would like to move forward with an MRI arthrogram of the right hip to evaluate the labrum and other intra-articular pathology.  She may use ibuprofen  as needed.  She will continue her PT and her home exercises.   If for some reason the MRI/MRI of the hip does not show significant pathology that would explain the above, we discussed other workup considerations  including EMG/nerve conduction study of the lower extremities to rule out any muscle related abnormality causing her weakness/gait change.  Eval: Woke up one day and her right hip was hurting and has progressively got worse over time.  She is limping a significant amount on arrival; arrives with her daughter.  Went to ED first; referred to Dr. Onesimo.  He did x-rays and gave prednisone  and that helped a little but then came right back; then referred to Dr. Burnetta.    PERTINENT HISTORY:  PAIN:  Are you having pain? Yes: NPRS scale: 9/10 with  walking; 3-4 when sitting Pain location: right side groin and right leg down to knee Pain description: tight,  Aggravating factors: standing, walking Relieving factors: sitting down  PRECAUTIONS: None  RWEIGHT BEARING RESTRICTIONS: No  FALLS:  Has patient fallen in last 6 months? No  OCCUPATION: works at [employer]  PLOF: Independent  PATIENT GOALS: ready to walk normal  NEXT MD VISIT: next week sees Dr. Burnetta  OBJECTIVE:  Note: Objective measures were completed at Evaluation unless otherwise noted.  DIAGNOSTIC FINDINGS:  X-rays of the right hip were obtained in clinic today.  No acute injuries are noted.  No degenerative changes.  No evidence of AVN.  No bony lesions.   Impression: Negative right hip x-rays  Complete lumbar film x-rays including AP, lateral, flexion/extension views  were ordered and reviewed by myself today.  X-rays demonstrate a mild  thoracolumbar levoscoliosis.  There are 5 nonrib-bearing lumbar vertebra  with preserved intervertebral disc spacing.  No notable DDD or acute bony  abnormality noted.    PATIENT SURVEYS:  LEFS  Extreme difficulty/unable (0), Quite a bit of difficulty (1), Moderate difficulty (2), Little difficulty (3), No difficulty (4) Survey date:    Score total:  12/80; 15%     COGNITION: Overall cognitive status: Within functional limits for tasks assessed     SENSATION: WFL  EDEMA:  None noted   POSTURE: increased lumbar lordosis and weight shift left  PALPATION: Tender right greater troch; glute; piriformis  LOWER EXTREMITY ROM:  Active ROM Right 09/24/23 Left 09/24/23 Right 10/09/23  Hip flexion 95 105   Hip extension     Hip abduction     Hip adduction     Hip internal rotation 35 - end range painful  WFL Pull front of thigh   Hip external rotation *Restricted secondary to pain - 10 degrees WFL 33  Knee flexion   138  Knee extension     Ankle dorsiflexion     Ankle plantarflexion     Ankle inversion     Ankle  eversion      (Blank rows = not tested)  LOWER EXTREMITY MMT:* mild pain  MMT Right eval Left eval Right 10/09/23  Hip flexion 2 4+ 4+*  Hip extension     Hip abduction     Hip adduction     Hip internal rotation     Hip external rotation     Knee flexion     Knee extension 4+ 5 5  Ankle dorsiflexion 4+ 5 5  Ankle plantarflexion     Ankle inversion     Ankle eversion        (Blank rows = not tested) LUMBAR ROM: *pain  Active  AROM  eval AROM  10/09/23  Flexion Fingertips* to mid shin painful right anterior hip Fingertips to anterior ankle No pain  Extension 70% available no pain Wfl no pain  Right lateral flexion Fingertips  to knee joint line   Left lateral flexion Fingertips to knee joint line   Right rotation    Left rotation     (Blank rows = not tested)   FUNCTIONAL TESTS:  5 times sit to stand: 23.84 using hands to assist up; leaning to the left Timed up and go (TUG): 22.21 sec heavy limp  GAIT: Distance walked: 50 ft in clinic Assistive device utilized: None Level of assistance: SBA Comments: heavy limp; improved with trial of using SPC                                                                                                                               TREATMENT DATE:  10/09/23 Progress note LEFS 32/80 40% (improved from 12/80 at evaluation) 5 times sit to stand no UE assist 13.99 sec TUG 11.68 sec no AD MMT's and AROM see above Leg length measurement right leg 76 cm, and left leg 75 cm Discussed heel cup or heel lift for left shoe  10/04/23 Manual Therapy: - Long axis distraction of right hip joint; bouts for relief prior to therapeutic ex - PROM R hip FL, IR/ER w/ cues for relaxation for improving mobility tolerance - Prone quad / hip flexor stretch w/ passive OP Therapeutic Exercise: - Nustep seat 6 UE/LE 5 minutes level 3 - Seated marching w/ cues for core engagement and upright posturing  Neuromuscular Re-education: - Supine IR/ER for  mm retraining - Prone heel squeeze for  engagement of gluts in prone to improve mm activation  10/02/23 Nustep seat 6 UE/LE 5 minutes level 3 Standing:  heelraises 10X, toeraises 10X  Hip abduction 10X  Hip extension 10X  Alternating march 10X  Lunge onto 4 step 10X each LE  Hamstring stretch bil 5X20 onto 12 step Seated LAQ Rt 10X5 holds Sit to stands 10X no UE Ambulation without AD and without limp 200 feet  09/25/23 Right Heel slide 2 x 10 DKTC with green physioball 2 x 10 Quadruped to child's pose with long axis distraction x 10 Quadruped right hip extension (unable to pick up foot off the mat) x10 Left Sidelying clam x 10 (limited range) Supine hip abduction x 10 Nustep seat 6 arms 8 x 5' to end treatment  09/24/23 Supine: hamstring measurement  bridge 2X10  SLR Lt 10X, Rt unable  Rt heelslides 10X  Rt SAQ 10X5  Pelvic tilts 20X5   Rt IR/ER 10X for ROM LTR rocking with physioball 10 reps bilaterally DKTC in supine on green theraball, 2 sets of 10 reps Sidelying clams 20X 3 each LE Standing lumbar extensions 10X  09/20/2023  Manual Therapy: -Short axis distraction of right hip joint, 5 bouts with therapist force with gait belt around proximal femur, in supine,  -PROM of right hip in supine, flexion/extension, IR and ER 10 reps for synovial fluid movement in joint Therapeutic Exercise: -Supine bridges 2 sets of 10 reps, 3 second holds, symptomatic, pt cued  for max hip extension -Side lying Clamshells, 1 set of 10 reps bilaterally Neuromuscular Re-education: -PPT 1 set of 10 reps, 3 second holds, pr cued to remain in pain free ROM -LTR 1 set of 10 reps bilaterally, pt cued to remain in pain free ROM -DKTC in supine on green theraball, 2 sets of 10 reps, pt reports decreased pain with hip flexion -Hooklying, hip abduction isometric, 1 set of 10 reps, 5 second hold  09/18/23 physical therapy evaluation and HEP instructions    PATIENT EDUCATION:  Education  details: Patient educated on exam findings, POC, scope of PT, HEP, and what to expect next visit. Person educated: Patient Education method: Explanation, Demonstration, and Handouts Education comprehension: verbalized understanding, returned demonstration, verbal cues required, and tactile cues required HOME EXERCISE PROGRAM: Access Code: [facility] URL: [email or web address] Date: 09/18/2023 Prepared by: [facility] - Rehab  Exercises - Standing Lumbar Extension with Counter  - 8 x daily - 7 x weekly - 1 sets - 10 reps - Standing Lumbar Extension  - 8 x daily - 7 x weekly - 1 sets - 10 reps  Access Code: [email or web address] URL: [email or web address] Date: 09/20/2023 Prepared by: Lang Ada  Exercises - Supine Heel Slide with Strap  - 1 x daily - 7 x weekly - 3 sets - 10 reps - Supine Bridge  - 1 x daily - 7 x weekly - 3 sets - 10 reps - 3 hold - Supine Lower Trunk Rotation  - 1 x daily - 7 x weekly - 3 sets - 10 reps - Supine Posterior Pelvic Tilt  - 1 x daily - 7 x weekly - 3 sets - 10 reps - Hooklying Isometric Hip Abduction with Belt  - 1 x daily - 7 x weekly - 3 sets - 10 reps - 5 hold ASSESSMENT:  CLINICAL IMPRESSION: Progress note today.  Patient with good improvement in all objective measures; right hip iliac crest slightly elevated versus left. Right leg slightly longer that the left so discussed heel cup for left shoe with patient. She continues with limp; increased trunk sway with stance phase right lower extremity although much improved from eval.  Patient met 2/2 short term goals and 1/5 long term goals today.  Patient will benefit from continued stilled therapy services to address remaining unmet and partially met goals.       Eval: Patient is a 28 y.o. female who was seen today for physical therapy evaluation and treatment for M25.551,G89.29 (ICD-10-CM) - Chronic right hip painM25.551,G89.29 (ICD-10-CM) - Chronic right hip pain. . Patient demonstrates muscle  weakness, reduced ROM, and fascial restrictions which are likely contributing to symptoms of pain and are negatively impacting patient ability to perform ADLs and functional mobility tasks. Patient will benefit from skilled physical therapy services to address these deficits to reduce pain and improve level of function with ADLs and functional mobility tasks.   OBJECTIVE IMPAIRMENTS: Abnormal gait, decreased activity tolerance, decreased balance, decreased knowledge of condition, decreased knowledge of use of DME, decreased mobility, difficulty walking, decreased ROM, decreased strength, increased fascial restrictions, impaired perceived functional ability, postural dysfunction, and pain.   ACTIVITY LIMITATIONS: carrying, lifting, bending, standing, sleeping, stairs, transfers, bed mobility, and locomotion level  PARTICIPATION LIMITATIONS: meal prep, cleaning, laundry, shopping, community activity, and occupation  REHAB POTENTIAL: Good  CLINICAL DECISION MAKING: Evolving/moderate complexity  EVALUATION COMPLEXITY: Moderate   GOALS: Goals reviewed with patient? Yes  SHORT TERM GOALS: Target date: 10/02/2023 patient will be independent with initial HEP  Baseline: Goal  status: met  2.  Patient will report 50% improvement overall  Baseline:  Goal status: met LONG TERM GOALS: Target date: 10/18/2023   Patient will be independent in self management strategies to improve quality of life and functional outcomes.  Baseline:  Goal status: INITIAL  2.  Patient will report 80% improvement overall  Baseline:  Goal status: INITIAL  3.  Patient will improve LEFS score by 30 points to demonstrate improved perceived function  Baseline: 12/80; 32/80 10/09/23 Goal status: INITIAL  4.  Patient will improve TUG score to 10 sec or less to demonstrate improved functional mobility and decreased fall risk  Baseline: 22.21; 11.68 sec Goal status: INITIAL  5.  Patient will improve 5 times sit  to stand score to 15 sec or less to demonstrate improved functional mobility and increased leg strength.    Baseline: 23.84 sec; 13.99 sec Goal status:  met    PLAN:  PT FREQUENCY: 2x/week  PT DURATION: 4 weeks  PLANNED INTERVENTIONS: 97164- PT Re-evaluation, 97110-Therapeutic exercises, 97530- Therapeutic activity, 97112- Neuromuscular re-education, 97535- Self Care, 02859- Manual therapy, [id number]- Gait training, [phone]- Orthotic Fit/training, [phone]- Canalith repositioning, [id number]- Aquatic Therapy, [phone]- Splinting, [phone]- Wound care (first 20 sq cm), 97598- Wound care (each additional 20 sq cm)Patient/Family education, Balance training, Stair training, Taping, Dry Needling, Joint mobilization, Joint manipulation, Spinal manipulation, Spinal mobilization, Scar mobilization, and DME instructions.   PLAN FOR NEXT SESSION: posterior hip stretching/manual techniques for improving IR/ER in pain free range; manual distraction; continue to progress LE strength and stability as well as core; Patient will benefit from continued stilled therapy services to address remaining unmet and partially met goals.     Managed Medicaid Authorization Request Treatment Start Date: 10/01/23   Visit Dx Codes: M25.551, R26.2   Functional Tool Score: LEFS 32/80 40%   For all possible CPT codes, reference the Planned Interventions line above.                       Check all conditions that are expected to impact treatment: {Conditions expected to impact treatment:None of these apply        If treatment provided at initial evaluation, no treatment charged due to lack of authorization.                         9:24 AM, 10/09/23 Philipe Laswell Small Willson Lipa MPT  physical therapy [place] [phone]

## 2023-10-11 ENCOUNTER — Ambulatory Visit (HOSPITAL_COMMUNITY)

## 2023-10-11 DIAGNOSIS — R262 Difficulty in walking, not elsewhere classified: Secondary | ICD-10-CM

## 2023-10-11 DIAGNOSIS — M25551 Pain in right hip: Secondary | ICD-10-CM | POA: Diagnosis not present

## 2023-10-11 NOTE — Therapy (Signed)
 OUTPATIENT PHYSICAL THERAPY LOWER EXTREMITY TREATMENT/PROGRESS NOTE Progress Note Reporting Period 09/18/2023 to 10/09/23  See note below for Objective Data and Assessment of Progress/Goals.       Patient Name: Jacqueline Koch MRN: [id number] DOB:[birth year], 28 y.o., female Today's Date: 10/11/2023  END OF SESSION:  PT End of Session - 10/11/23 0759     Visit Number 8    Number of Visits 16    Date for PT Re-Evaluation 11/08/23    Authorization Type [place] Medicaid Prepaid    Authorization Time Period healthy blue approved 6 visits from 10/11/2023-12/09/2023 [facility]    Authorization - Visit Number 1    Authorization - Number of Visits 6    Progress Note Due on Visit 6    PT Start Time 0756    PT Stop Time 0836    PT Time Calculation (min) 40 min    Activity Tolerance Patient tolerated treatment well    Behavior During Therapy [facility] for tasks assessed/performed            Past Medical History:  Diagnosis Date   Kidney stone    Nexplanon  removal 06/09/2014   Syncope    No past surgical history on file. Patient Active Problem List   Diagnosis Date Noted   Closed nondisplaced fracture of shaft of left clavicle 03/30/2020   Nondisplaced fracture of body of left scapula with routine healing 03/30/2020   Nexplanon  insertion 08/22/2015   Susceptible to varicella (non-immune), currently pregnant 12/13/2014   Marijuana use 12/08/2014   Chlamydia infection 01/20/2013   Adolescent scoliosis 12/01/2012   Abnormal vision screen 12/01/2012   Hearing deficit 12/01/2012   Scoliosis 11/02/2010   Back pain 11/02/2010    PCP: none  REFERRING PROVIDER: Burnetta Brunet, DO REFERRING DIAG: [phone] (ICD-10-CM) - Chronic right hip pain  THERAPY DIAG:  Pain in right hip  Difficulty in walking, not elsewhere classified  Rationale for Evaluation and Treatment: Rehabilitation  ONSET DATE: 4 months  SUBJECTIVE:   SUBJECTIVE STATEMENT: Had to take some ibuprophen this  morning; think likely due to cool weather.  Now is waiting on MRI to be scheduled.  She reports overall she is maybe 60% better overall.    MD visit: 10/01/23 - Plan: Impression is chronic right hip pain with associated limp which has had significant improvement in pain but still having a limp/gait abnormality.  She did have intra-articular hip injection about 6 weeks ago and only has very limited pain about the hip with walking and mild pain/weakness within the hip/groin when first waking.  Her symptoms do suggest she may have a labral abnormality about the hip, but I am unsure why she still has a functional gait abnormality.  She has no leg length discrepancy.  She does have a mild thoracolumbar scoliosis but this is not advanced enough to cause a significant gait abnormality and this has only been present since her hip pain began.  She otherwise neurologically is intact and has full muscle strength of her left leg and upper extremities. Given this, I would like to move forward with an MRI arthrogram of the right hip to evaluate the labrum and other intra-articular pathology.  She may use ibuprofen  as needed.  She will continue her PT and her home exercises.   If for some reason the MRI/MRI of the hip does not show significant pathology that would explain the above, we discussed other workup considerations including EMG/nerve conduction study of the lower extremities to rule out any muscle related  abnormality causing her weakness/gait change.  Eval: Woke up one day and her right hip was hurting and has progressively got worse over time.  She is limping a significant amount on arrival; arrives with her daughter.  Went to ED first; referred to Dr. Onesimo.  He did x-rays and gave prednisone  and that helped a little but then came right back; then referred to Dr. Burnetta.    PERTINENT HISTORY:  PAIN:  Are you having pain? Yes: NPRS scale: 9/10 with walking; 3-4 when sitting Pain location: right side groin  and right leg down to knee Pain description: tight,  Aggravating factors: standing, walking Relieving factors: sitting down  PRECAUTIONS: None  RWEIGHT BEARING RESTRICTIONS: No  FALLS:  Has patient fallen in last 6 months? No  OCCUPATION: works at [employer]  PLOF: Independent  PATIENT GOALS: ready to walk normal  NEXT MD VISIT: next week sees Dr. Burnetta  OBJECTIVE:  Note: Objective measures were completed at Evaluation unless otherwise noted.  DIAGNOSTIC FINDINGS:  X-rays of the right hip were obtained in clinic today.  No acute injuries are noted.  No degenerative changes.  No evidence of AVN.  No bony lesions.   Impression: Negative right hip x-rays  Complete lumbar film x-rays including AP, lateral, flexion/extension views  were ordered and reviewed by myself today.  X-rays demonstrate a mild  thoracolumbar levoscoliosis.  There are 5 nonrib-bearing lumbar vertebra  with preserved intervertebral disc spacing.  No notable DDD or acute bony  abnormality noted.    PATIENT SURVEYS:  LEFS  Extreme difficulty/unable (0), Quite a bit of difficulty (1), Moderate difficulty (2), Little difficulty (3), No difficulty (4) Survey date:    Score total:  12/80; 15%     COGNITION: Overall cognitive status: Within functional limits for tasks assessed     SENSATION: WFL  EDEMA:  None noted   POSTURE: increased lumbar lordosis and weight shift left  PALPATION: Tender right greater troch; glute; piriformis  LOWER EXTREMITY ROM:  Active ROM Right 09/24/23 Left 09/24/23 Right 10/09/23  Hip flexion 95 105   Hip extension     Hip abduction     Hip adduction     Hip internal rotation 35 - end range painful  WFL Pull front of thigh   Hip external rotation *Restricted secondary to pain - 10 degrees WFL 33  Knee flexion   138  Knee extension     Ankle dorsiflexion     Ankle plantarflexion     Ankle inversion     Ankle eversion      (Blank rows = not tested)  LOWER EXTREMITY  MMT:* mild pain  MMT Right eval Left eval Right 10/09/23  Hip flexion 2 4+ 4+*  Hip extension     Hip abduction     Hip adduction     Hip internal rotation     Hip external rotation     Knee flexion     Knee extension 4+ 5 5  Ankle dorsiflexion 4+ 5 5  Ankle plantarflexion     Ankle inversion     Ankle eversion        (Blank rows = not tested) LUMBAR ROM: *pain  Active  AROM  eval AROM  10/09/23  Flexion Fingertips* to mid shin painful right anterior hip Fingertips to anterior ankle No pain  Extension 70% available no pain Wfl no pain  Right lateral flexion Fingertips to knee joint line   Left lateral flexion Fingertips to knee joint line  Right rotation    Left rotation     (Blank rows = not tested)   FUNCTIONAL TESTS:  5 times sit to stand: 23.84 using hands to assist up; leaning to the left Timed up and go (TUG): 22.21 sec heavy limp  GAIT: Distance walked: 50 ft in clinic Assistive device utilized: None Level of assistance: SBA Comments: heavy limp; improved with trial of using SPC                                                                                                                               TREATMENT DATE:  10/11/23 Nustep seat 6 x 5' level 3 dynamic warm up Supine manual long axis distraction 10 hold x 10 LTR x 10 Abdominal bracing with Bent knee fall outs 2 x 10 Sidelying clam for right hip 2 x 10 Prone heel squeezes 5 hold x 10 Standing: Heel raises 2 x 10 Toe raises 2 x 10 Goblet squat with 5# kettlebell painful so d/c  Squats to chair 2 x 10 Balance board F/B and S/S x 1' each minimal working to no UE assist [employer] 5 x 20    10/09/23 Progress note LEFS 32/80 40% (improved from 12/80 at evaluation) 5 times sit to stand no UE assist 13.99 sec TUG 11.68 sec no AD MMT's and AROM see above Leg length measurement right leg 76 cm, and left leg 75 cm Discussed heel cup or heel lift for left shoe  10/04/23 Manual Therapy: -  Long axis distraction of right hip joint; bouts for relief prior to therapeutic ex - PROM R hip FL, IR/ER w/ cues for relaxation for improving mobility tolerance - Prone quad / hip flexor stretch w/ passive OP Therapeutic Exercise: - Nustep seat 6 UE/LE 5 minutes level 3 - Seated marching w/ cues for core engagement and upright posturing  Neuromuscular Re-education: - Supine IR/ER for mm retraining - Prone heel squeeze for  engagement of gluts in prone to improve mm activation  10/02/23 Nustep seat 6 UE/LE 5 minutes level 3 Standing:  heelraises 10X, toeraises 10X  Hip abduction 10X  Hip extension 10X  Alternating march 10X  Lunge onto 4 step 10X each LE  Hamstring stretch bil 5X20 onto 12 step Seated LAQ Rt 10X5 holds Sit to stands 10X no UE Ambulation without AD and without limp 200 feet  09/25/23 Right Heel slide 2 x 10 DKTC with green physioball 2 x 10 Quadruped to child's pose with long axis distraction x 10 Quadruped right hip extension (unable to pick up foot off the mat) x10 Left Sidelying clam x 10 (limited range) Supine hip abduction x 10 Nustep seat 6 arms 8 x 5' to end treatment  09/24/23 Supine: hamstring measurement  bridge 2X10  SLR Lt 10X, Rt unable  Rt heelslides 10X  Rt SAQ 10X5  Pelvic tilts 20X5   Rt IR/ER 10X for ROM LTR rocking with physioball 10 reps bilaterally  DKTC in supine on green theraball, 2 sets of 10 reps Sidelying clams 20X 3 each LE Standing lumbar extensions 10X  09/20/2023  Manual Therapy: -Short axis distraction of right hip joint, 5 bouts with therapist force with gait belt around proximal femur, in supine,  -PROM of right hip in supine, flexion/extension, IR and ER 10 reps for synovial fluid movement in joint Therapeutic Exercise: -Supine bridges 2 sets of 10 reps, 3 second holds, symptomatic, pt cued for max hip extension -Side lying Clamshells, 1 set of 10 reps bilaterally Neuromuscular Re-education: -PPT 1 set of 10  reps, 3 second holds, pr cued to remain in pain free ROM -LTR 1 set of 10 reps bilaterally, pt cued to remain in pain free ROM -DKTC in supine on green theraball, 2 sets of 10 reps, pt reports decreased pain with hip flexion -Hooklying, hip abduction isometric, 1 set of 10 reps, 5 second hold  09/18/23 physical therapy evaluation and HEP instructions    PATIENT EDUCATION:  Education details: Patient educated on exam findings, POC, scope of PT, HEP, and what to expect next visit. Person educated: Patient Education method: Explanation, Demonstration, and Handouts Education comprehension: verbalized understanding, returned demonstration, verbal cues required, and tactile cues required HOME EXERCISE PROGRAM: Access Code: [facility] URL: [email or web address] Date: 09/18/2023 Prepared by: [facility] - Rehab  Exercises - Standing Lumbar Extension with Counter  - 8 x daily - 7 x weekly - 1 sets - 10 reps - Standing Lumbar Extension  - 8 x daily - 7 x weekly - 1 sets - 10 reps  Access Code: [email or web address] URL: [email or web address] Date: 09/20/2023 Prepared by: Lang Ada  Exercises - Supine Heel Slide with Strap  - 1 x daily - 7 x weekly - 3 sets - 10 reps - Supine Bridge  - 1 x daily - 7 x weekly - 3 sets - 10 reps - 3 hold - Supine Lower Trunk Rotation  - 1 x daily - 7 x weekly - 3 sets - 10 reps - Supine Posterior Pelvic Tilt  - 1 x daily - 7 x weekly - 3 sets - 10 reps - Hooklying Isometric Hip Abduction with Belt  - 1 x daily - 7 x weekly - 3 sets - 10 reps - 5 hold ASSESSMENT:  CLINICAL IMPRESSION: Today's session with continued focus on right hip mobility and strength.  Continues with limp although overall improving.  Trial of goblet squat with kettlebell is painful so d/c; squat to chair is ok. Added balance board today to work on distributing her weight appropriately with good challenge.  Added knee fall outs to HEP.     Patient will benefit from continued stilled  therapy services to address remaining unmet and partially met goals.       Eval: Patient is a 28 y.o. female who was seen today for physical therapy evaluation and treatment for M25.551,G89.29 (ICD-10-CM) - Chronic right hip painM25.551,G89.29 (ICD-10-CM) - Chronic right hip pain. . Patient demonstrates muscle weakness, reduced ROM, and fascial restrictions which are likely contributing to symptoms of pain and are negatively impacting patient ability to perform ADLs and functional mobility tasks. Patient will benefit from skilled physical therapy services to address these deficits to reduce pain and improve level of function with ADLs and functional mobility tasks.   OBJECTIVE IMPAIRMENTS: Abnormal gait, decreased activity tolerance, decreased balance, decreased knowledge of condition, decreased knowledge of use of DME, decreased mobility, difficulty walking, decreased ROM, decreased strength, increased fascial restrictions, impaired perceived functional ability,  postural dysfunction, and pain.   ACTIVITY LIMITATIONS: carrying, lifting, bending, standing, sleeping, stairs, transfers, bed mobility, and locomotion level  PARTICIPATION LIMITATIONS: meal prep, cleaning, laundry, shopping, community activity, and occupation  REHAB POTENTIAL: Good  CLINICAL DECISION MAKING: Evolving/moderate complexity  EVALUATION COMPLEXITY: Moderate   GOALS: Goals reviewed with patient? Yes  SHORT TERM GOALS: Target date: 10/02/2023 patient will be independent with initial HEP  Baseline: Goal status: met  2.  Patient will report 50% improvement overall  Baseline:  Goal status: met LONG TERM GOALS: Target date: 10/18/2023   Patient will be independent in self management strategies to improve quality of life and functional outcomes.  Baseline:  Goal status: INITIAL  2.  Patient will report 80% improvement overall  Baseline:  Goal status: INITIAL  3.  Patient will improve LEFS score by 30 points  to demonstrate improved perceived function  Baseline: 12/80; 32/80 10/09/23 Goal status: INITIAL  4.  Patient will improve TUG score to 10 sec or less to demonstrate improved functional mobility and decreased fall risk  Baseline: 22.21; 11.68 sec Goal status: INITIAL  5.  Patient will improve 5 times sit to stand score to 15 sec or less to demonstrate improved functional mobility and increased leg strength.    Baseline: 23.84 sec; 13.99 sec Goal status:  met    PLAN:  PT FREQUENCY: 2x/week  PT DURATION: 4 weeks  PLANNED INTERVENTIONS: 97164- PT Re-evaluation, 97110-Therapeutic exercises, 97530- Therapeutic activity, 97112- Neuromuscular re-education, 97535- Self Care, [id number]- Manual therapy, [id number]- Gait training, [phone]- Orthotic Fit/training, [phone]- Canalith repositioning, [id number]- Aquatic Therapy, [phone]- Splinting, [phone]- Wound care (first 20 sq cm), 97598- Wound care (each additional 20 sq cm)Patient/Family education, Balance training, Stair training, Taping, Dry Needling, Joint mobilization, Joint manipulation, Spinal manipulation, Spinal mobilization, Scar mobilization, and DME instructions.   PLAN FOR NEXT SESSION: posterior hip stretching/manual techniques for improving IR/ER in pain free range; manual distraction; continue to progress LE strength and stability as well as core; Patient will benefit from continued stilled therapy services to address remaining unmet and partially met goals.       8:21 AM, 10/11/23 Donato Studley Small [place] Belson MPT [facility] [place] [phone]

## 2023-10-15 ENCOUNTER — Ambulatory Visit (HOSPITAL_COMMUNITY): Admitting: Physical Therapy

## 2023-10-15 ENCOUNTER — Encounter: Payer: Self-pay | Admitting: Sports Medicine

## 2023-10-15 DIAGNOSIS — R262 Difficulty in walking, not elsewhere classified: Secondary | ICD-10-CM

## 2023-10-15 DIAGNOSIS — M25551 Pain in right hip: Secondary | ICD-10-CM

## 2023-10-15 NOTE — Therapy (Signed)
 OUTPATIENT PHYSICAL THERAPY LOWER EXTREMITY TREATMENT  Patient Name: Jacqueline Koch MRN: [id number] DOB:02/03/1996, 28 y.o., female Today's Date: 10/15/2023  END OF SESSION:  PT End of Session - 10/15/23 1349     Visit Number 9    Number of Visits 13    Date for PT Re-Evaluation 11/08/23    Authorization Type [place] [employer] Prepaid    Authorization Time Period healthy blue approved 6 visits from 10/11/2023-12/09/2023 [facility]    Authorization - Visit Number 2    Authorization - Number of Visits 6    Progress Note Due on Visit 13    PT Start Time 1448    PT Stop Time 1528    PT Time Calculation (min) 40 min    Activity Tolerance Patient tolerated treatment well    Behavior During Therapy [facility] for tasks assessed/performed            Past Medical History:  Diagnosis Date   Kidney stone    Nexplanon  removal 06/09/2014   Syncope    No past surgical history on file. Patient Active Problem List   Diagnosis Date Noted   Closed nondisplaced fracture of shaft of left clavicle 03/30/2020   Nondisplaced fracture of body of left scapula with routine healing 03/30/2020   Nexplanon  insertion 08/22/2015   Susceptible to varicella (non-immune), currently pregnant 12/13/2014   Marijuana use 12/08/2014   Chlamydia infection 01/20/2013   Adolescent scoliosis 12/01/2012   Abnormal vision screen 12/01/2012   Hearing deficit 12/01/2012   Scoliosis 11/02/2010   Back pain 11/02/2010    PCP: none  REFERRING PROVIDER: Burnetta Brunet, DO REFERRING DIAG: [phone] (ICD-10-CM) - Chronic right hip pain  THERAPY DIAG:  Pain in right hip  Difficulty in walking, not elsewhere classified  Rationale for Evaluation and Treatment: Rehabilitation  ONSET DATE: 4 months  SUBJECTIVE:   SUBJECTIVE STATEMENT: Pt states medicaid has approved the MRI so just waiting on that.  Currently 1/10 pain in hip, however continues with severe antalgic gait.     MD visit: 10/01/23 - Plan: Impression is  chronic right hip pain with associated limp which has had significant improvement in pain but still having a limp/gait abnormality.  She did have intra-articular hip injection about 6 weeks ago and only has very limited pain about the hip with walking and mild pain/weakness within the hip/groin when first waking.  Her symptoms do suggest she may have a labral abnormality about the hip, but I am unsure why she still has a functional gait abnormality.  She has no leg length discrepancy.  She does have a mild thoracolumbar scoliosis but this is not advanced enough to cause a significant gait abnormality and this has only been present since her hip pain began.  She otherwise neurologically is intact and has full muscle strength of her left leg and upper extremities. Given this, I would like to move forward with an MRI arthrogram of the right hip to evaluate the labrum and other intra-articular pathology.  She may use ibuprofen  as needed.  She will continue her PT and her home exercises.   If for some reason the MRI/MRI of the hip does not show significant pathology that would explain the above, we discussed other workup considerations including EMG/nerve conduction study of the lower extremities to rule out any muscle related abnormality causing her weakness/gait change.  Eval: Woke up one day and her right hip was hurting and has progressively got worse over time.  She is limping a significant amount  on arrival; arrives with her daughter.  Went to ED first; referred to Dr. Onesimo.  He did x-rays and gave prednisone  and that helped a little but then came right back; then referred to Dr. Burnetta.    PERTINENT HISTORY:  PAIN:  Are you having pain? Yes: NPRS scale: 9/10 with walking; 3-4 when sitting Pain location: right side groin and right leg down to knee Pain description: tight,  Aggravating factors: standing, walking Relieving factors: sitting down  PRECAUTIONS: None  RWEIGHT BEARING RESTRICTIONS:  No  FALLS:  Has patient fallen in last 6 months? No  OCCUPATION: works at [employer]  PLOF: Independent  PATIENT GOALS: ready to walk normal  NEXT MD VISIT: next week sees Dr. Burnetta  OBJECTIVE:  Note: Objective measures were completed at Evaluation unless otherwise noted.  DIAGNOSTIC FINDINGS:  X-rays of the right hip were obtained in clinic today.  No acute injuries are noted.  No degenerative changes.  No evidence of AVN.  No bony lesions.   Impression: Negative right hip x-rays  Complete lumbar film x-rays including AP, lateral, flexion/extension views  were ordered and reviewed by myself today.  X-rays demonstrate a mild  thoracolumbar levoscoliosis.  There are 5 nonrib-bearing lumbar vertebra  with preserved intervertebral disc spacing.  No notable DDD or acute bony  abnormality noted.    PATIENT SURVEYS:  LEFS  Extreme difficulty/unable (0), Quite a bit of difficulty (1), Moderate difficulty (2), Little difficulty (3), No difficulty (4) Survey date:    Score total:  12/80; 15%     COGNITION: Overall cognitive status: Within functional limits for tasks assessed     SENSATION: WFL  EDEMA:  None noted   POSTURE: increased lumbar lordosis and weight shift left  PALPATION: Tender right greater troch; glute; piriformis  LOWER EXTREMITY ROM:  Active ROM Right 09/24/23 Left 09/24/23 Right 10/09/23  Hip flexion 95 105   Hip extension     Hip abduction     Hip adduction     Hip internal rotation 35 - end range painful  WFL Pull front of thigh   Hip external rotation *Restricted secondary to pain - 10 degrees WFL 33  Knee flexion   138  Knee extension     Ankle dorsiflexion     Ankle plantarflexion     Ankle inversion     Ankle eversion      (Blank rows = not tested)  LOWER EXTREMITY MMT:* mild pain  MMT Right eval Left eval Right 10/09/23  Hip flexion 2 4+ 4+*  Hip extension     Hip abduction     Hip adduction     Hip internal rotation     Hip  external rotation     Knee flexion     Knee extension 4+ 5 5  Ankle dorsiflexion 4+ 5 5  Ankle plantarflexion     Ankle inversion     Ankle eversion        (Blank rows = not tested) LUMBAR ROM: *pain  Active  AROM  eval AROM  10/09/23  Flexion Fingertips* to mid shin painful right anterior hip Fingertips to anterior ankle No pain  Extension 70% available no pain Wfl no pain  Right lateral flexion Fingertips to knee joint line   Left lateral flexion Fingertips to knee joint line   Right rotation    Left rotation     (Blank rows = not tested)   FUNCTIONAL TESTS:  5 times sit to stand: 23.84 using hands  to assist up; leaning to the left Timed up and go (TUG): 22.21 sec heavy limp  GAIT: Distance walked: 50 ft in clinic Assistive device utilized: None Level of assistance: SBA Comments: heavy limp; improved with trial of using SPC                                                                                                                               TREATMENT DATE:  10/15/23 Nustep seat 6 x 5' level 3 dynamic warm up Standing: Heel raises 2 x 10 on incline Toe raises 2 x 10 on decline Hip abduction 2X10 each Hip extension 2X10 each High march hold 2X10 with 3 holds 3 way leg kicks 3 holds bil UE assist 5X3 Squats to chair 2 x 10 Balance board F/B and S/S x 2' stable as long as possible in middle intermittent UE assist Slant board 5 x 30   10/11/23 Nustep seat 6 x 5' level 3 dynamic warm up Supine manual long axis distraction 10 hold x 10 LTR x 10 Abdominal bracing with Bent knee fall outs 2 x 10 Sidelying clam for right hip 2 x 10 Prone heel squeezes 5 hold x 10 Standing: Heel raises 2 x 10 Toe raises 2 x 10 Goblet squat with 5# kettlebell painful so d/c  Squats to chair 2 x 10 Balance board F/B and S/S x 1' each minimal working to no UE assist [employer] 5 x 20    10/09/23 Progress note LEFS 32/80 40% (improved from 12/80 at evaluation) 5 times  sit to stand no UE assist 13.99 sec TUG 11.68 sec no AD MMT's and AROM see above Leg length measurement right leg 76 cm, and left leg 75 cm Discussed heel cup or heel lift for left shoe     PATIENT EDUCATION:  Education details: Patient educated on exam findings, POC, scope of PT, HEP, and what to expect next visit. Person educated: Patient Education method: Explanation, Demonstration, and Handouts Education comprehension: verbalized understanding, returned demonstration, verbal cues required, and tactile cues required HOME EXERCISE PROGRAM: Access Code: [facility] URL: [email or web address] Date: 09/18/2023 Prepared by: AP - Rehab  Exercises - Standing Lumbar Extension with Counter  - 8 x daily - 7 x weekly - 1 sets - 10 reps - Standing Lumbar Extension  - 8 x daily - 7 x weekly - 1 sets - 10 reps  Access Code: [email or web address] URL: [email or web address] Date: 09/20/2023 Prepared by: Lang Ada  Exercises - Supine Heel Slide with Strap  - 1 x daily - 7 x weekly - 3 sets - 10 reps - Supine Bridge  - 1 x daily - 7 x weekly - 3 sets - 10 reps - 3 hold - Supine Lower Trunk Rotation  - 1 x daily - 7 x weekly - 3 sets - 10 reps - Supine Posterior Pelvic Tilt  - 1 x daily - 7 x weekly - 3 sets - 10 reps -  Hooklying Isometric Hip Abduction with Belt  - 1 x daily - 7 x weekly - 3 sets - 10 reps - 5 hold  ASSESSMENT:  CLINICAL IMPRESSION: Focus remains on improving right hip mobility and strength.  Added hip strengthening exercises in standing today with 1-2 UE assist.  Began 3 way leg kicks/holds, however unable to do with just 1 UE assist  with Rt due to weakness. Was able to complete 1 UE with Lt LE stance, however challenging and required cues for maintaining upright posturing.   Pt able to complete all other exercises in good form overall with minimal cues.  No pain voiced with therex today.  Continues to ambulate with significant limp although overall improving.    Patient will benefit from continued stilled therapy services to address remaining unmet and partially met goals.       Eval: Patient is a 27 y.o. female who was seen today for physical therapy evaluation and treatment for M25.551,G89.29 (ICD-10-CM) - Chronic right hip painM25.551,G89.29 (ICD-10-CM) - Chronic right hip pain. . Patient demonstrates muscle weakness, reduced ROM, and fascial restrictions which are likely contributing to symptoms of pain and are negatively impacting patient ability to perform ADLs and functional mobility tasks. Patient will benefit from skilled physical therapy services to address these deficits to reduce pain and improve level of function with ADLs and functional mobility tasks.   OBJECTIVE IMPAIRMENTS: Abnormal gait, decreased activity tolerance, decreased balance, decreased knowledge of condition, decreased knowledge of use of DME, decreased mobility, difficulty walking, decreased ROM, decreased strength, increased fascial restrictions, impaired perceived functional ability, postural dysfunction, and pain.   ACTIVITY LIMITATIONS: carrying, lifting, bending, standing, sleeping, stairs, transfers, bed mobility, and locomotion level  PARTICIPATION LIMITATIONS: meal prep, cleaning, laundry, shopping, community activity, and occupation  REHAB POTENTIAL: Good  CLINICAL DECISION MAKING: Evolving/moderate complexity  EVALUATION COMPLEXITY: Moderate   GOALS: Goals reviewed with patient? Yes  SHORT TERM GOALS: Target date: 10/02/2023 patient will be independent with initial HEP  Baseline: Goal status: met  2.  Patient will report 50% improvement overall  Baseline:  Goal status: met LONG TERM GOALS: Target date: 10/18/2023   Patient will be independent in self management strategies to improve quality of life and functional outcomes.  Baseline:  Goal status: INITIAL  2.  Patient will report 80% improvement overall  Baseline:  Goal status: INITIAL  3.   Patient will improve LEFS score by 30 points to demonstrate improved perceived function  Baseline: 12/80; 32/80 10/09/23 Goal status: INITIAL  4.  Patient will improve TUG score to 10 sec or less to demonstrate improved functional mobility and decreased fall risk  Baseline: 22.21; 11.68 sec Goal status: INITIAL  5.  Patient will improve 5 times sit to stand score to 15 sec or less to demonstrate improved functional mobility and increased leg strength.    Baseline: 23.84 sec; 13.99 sec Goal status:  met    PLAN:  PT FREQUENCY: 2x/week  PT DURATION: 4 weeks  PLANNED INTERVENTIONS: 97164- PT Re-evaluation, 97110-Therapeutic exercises, 97530- Therapeutic activity, 97112- Neuromuscular re-education, 97535- Self Care, [id number]- Manual therapy, [id number]- Gait training, [phone]- Orthotic Fit/training, [phone]- Canalith repositioning, [id number]- Aquatic Therapy, [phone]- Splinting, [phone]- Wound care (first 20 sq cm), 97598- Wound care (each additional 20 sq cm)Patient/Family education, Balance training, Stair training, Taping, Dry Needling, Joint mobilization, Joint manipulation, Spinal manipulation, Spinal mobilization, Scar mobilization, and DME instructions.   PLAN FOR NEXT SESSION: continue to progress LE strength and stability as well as core.  Update  HEP as needed.        2:34 PM, 10/15/23 Greig KATHEE Fuse, PTA/CLT [facility] Ph: [phone]

## 2023-10-17 ENCOUNTER — Ambulatory Visit (HOSPITAL_COMMUNITY)

## 2023-10-17 ENCOUNTER — Encounter (HOSPITAL_COMMUNITY): Payer: Self-pay

## 2023-10-17 DIAGNOSIS — R262 Difficulty in walking, not elsewhere classified: Secondary | ICD-10-CM

## 2023-10-17 DIAGNOSIS — M25551 Pain in right hip: Secondary | ICD-10-CM

## 2023-10-17 NOTE — Therapy (Signed)
 OUTPATIENT PHYSICAL THERAPY LOWER EXTREMITY TREATMENT  Patient Name: Jacqueline Koch MRN: [id number] DOB:May 08, 1995, 28 y.o., female Today's Date: 10/17/2023  END OF SESSION:  PT End of Session - 10/17/23 0932     Visit Number 10    Number of Visits 13    Date for PT Re-Evaluation 11/08/23    Authorization Type [place] [employer] Prepaid    Authorization Time Period healthy blue approved 6 visits from 10/11/2023-12/09/2023 [facility]    Authorization - Visit Number 3    Authorization - Number of Visits 6    Progress Note Due on Visit 13    PT Start Time 0934    PT Stop Time 1012    PT Time Calculation (min) 38 min    Activity Tolerance Patient tolerated treatment well    Behavior During Therapy [facility] for tasks assessed/performed            Past Medical History:  Diagnosis Date   Kidney stone    Nexplanon  removal 06/09/2014   Syncope    History reviewed. No pertinent surgical history. Patient Active Problem List   Diagnosis Date Noted   Closed nondisplaced fracture of shaft of left clavicle 03/30/2020   Nondisplaced fracture of body of left scapula with routine healing 03/30/2020   Nexplanon  insertion 08/22/2015   Susceptible to varicella (non-immune), currently pregnant 12/13/2014   Marijuana use 12/08/2014   Chlamydia infection 01/20/2013   Adolescent scoliosis 12/01/2012   Abnormal vision screen 12/01/2012   Hearing deficit 12/01/2012   Scoliosis 11/02/2010   Back pain 11/02/2010    PCP: none  REFERRING PROVIDER: Burnetta Brunet, DO REFERRING DIAG: [phone] (ICD-10-CM) - Chronic right hip pain  THERAPY DIAG:  Pain in right hip  Difficulty in walking, not elsewhere classified  Rationale for Evaluation and Treatment: Rehabilitation  ONSET DATE: 4 months  SUBJECTIVE:   SUBJECTIVE STATEMENT: Pt reports no pain in hip this date. Reports MRI is scheduled for next Wednesday, 10/23/23. Pt reports still having difficulty with Clamshells in HEP and stairs at home.  Reports it hard to put full weight on RLE.    MD visit: 10/01/23 - Plan: Impression is chronic right hip pain with associated limp which has had significant improvement in pain but still having a limp/gait abnormality.  She did have intra-articular hip injection about 6 weeks ago and only has very limited pain about the hip with walking and mild pain/weakness within the hip/groin when first waking.  Her symptoms do suggest she may have a labral abnormality about the hip, but I am unsure why she still has a functional gait abnormality.  She has no leg length discrepancy.  She does have a mild thoracolumbar scoliosis but this is not advanced enough to cause a significant gait abnormality and this has only been present since her hip pain began.  She otherwise neurologically is intact and has full muscle strength of her left leg and upper extremities. Given this, I would like to move forward with an MRI arthrogram of the right hip to evaluate the labrum and other intra-articular pathology.  She may use ibuprofen  as needed.  She will continue her PT and her home exercises.   If for some reason the MRI/MRI of the hip does not show significant pathology that would explain the above, we discussed other workup considerations including EMG/nerve conduction study of the lower extremities to rule out any muscle related abnormality causing her weakness/gait change.  Eval: Woke up one day and her right hip was hurting and  has progressively got worse over time.  She is limping a significant amount on arrival; arrives with her daughter.  Went to ED first; referred to Dr. Onesimo.  He did x-rays and gave prednisone  and that helped a little but then came right back; then referred to Dr. Burnetta.    PERTINENT HISTORY:  PAIN:  Are you having pain? Yes: NPRS scale: 9/10 with walking; 3-4 when sitting Pain location: right side groin and right leg down to knee Pain description: tight,  Aggravating factors: standing,  walking Relieving factors: sitting down  PRECAUTIONS: None  RWEIGHT BEARING RESTRICTIONS: No  FALLS:  Has patient fallen in last 6 months? No  OCCUPATION: works at [employer]  PLOF: Independent  PATIENT GOALS: ready to walk normal  NEXT MD VISIT: next week sees Dr. Burnetta  OBJECTIVE:  Note: Objective measures were completed at Evaluation unless otherwise noted.  DIAGNOSTIC FINDINGS:  X-rays of the right hip were obtained in clinic today.  No acute injuries are noted.  No degenerative changes.  No evidence of AVN.  No bony lesions.   Impression: Negative right hip x-rays  Complete lumbar film x-rays including AP, lateral, flexion/extension views  were ordered and reviewed by myself today.  X-rays demonstrate a mild  thoracolumbar levoscoliosis.  There are 5 nonrib-bearing lumbar vertebra  with preserved intervertebral disc spacing.  No notable DDD or acute bony  abnormality noted.    PATIENT SURVEYS:  LEFS  Extreme difficulty/unable (0), Quite a bit of difficulty (1), Moderate difficulty (2), Little difficulty (3), No difficulty (4) Survey date:    Score total:  12/80; 15%     COGNITION: Overall cognitive status: Within functional limits for tasks assessed     SENSATION: WFL  EDEMA:  None noted   POSTURE: increased lumbar lordosis and weight shift left  PALPATION: Tender right greater troch; glute; piriformis  LOWER EXTREMITY ROM:  Active ROM Right 09/24/23 Left 09/24/23 Right 10/09/23  Hip flexion 95 105   Hip extension     Hip abduction     Hip adduction     Hip internal rotation 35 - end range painful  WFL Pull front of thigh   Hip external rotation *Restricted secondary to pain - 10 degrees WFL 33  Knee flexion   138  Knee extension     Ankle dorsiflexion     Ankle plantarflexion     Ankle inversion     Ankle eversion      (Blank rows = not tested)  LOWER EXTREMITY MMT:* mild pain  MMT Right eval Left eval Right 10/09/23  Hip flexion 2 4+ 4+*   Hip extension     Hip abduction     Hip adduction     Hip internal rotation     Hip external rotation     Knee flexion     Knee extension 4+ 5 5  Ankle dorsiflexion 4+ 5 5  Ankle plantarflexion     Ankle inversion     Ankle eversion        (Blank rows = not tested) LUMBAR ROM: *pain  Active  AROM  eval AROM  10/09/23  Flexion Fingertips* to mid shin painful right anterior hip Fingertips to anterior ankle No pain  Extension 70% available no pain Wfl no pain  Right lateral flexion Fingertips to knee joint line   Left lateral flexion Fingertips to knee joint line   Right rotation    Left rotation     (Blank rows = not tested)  FUNCTIONAL TESTS:  5 times sit to stand: 23.84 using hands to assist up; leaning to the left Timed up and go (TUG): 22.21 sec heavy limp  GAIT: Distance walked: 50 ft in clinic Assistive device utilized: None Level of assistance: SBA Comments: heavy limp; improved with trial of using SPC                                                                                                                               TREATMENT DATE:  10/17/23: Squats from chair height, 2x10 Forward steps, 4 inch step, 10x with UE support  10x without UE support Step overs, 4 inch step, 10x ascending with LLE and descending with RLE  2 inch step, 10x with RLE ascending and LLE descending Side stepping, 20 ftx2, with red TB  + mini squat, 20 ftx2 Obstacle navigation, three 6 inch obstacles in // bars: Forward steps 2x down and back  Side steps, 2x down and back  Nustep seat 6 x 5' level 4 dynamic warm up  10/15/23 Nustep seat 6 x 5' level 3 dynamic warm up Standing: Heel raises 2 x 10 on incline Toe raises 2 x 10 on decline Hip abduction 2X10 each Hip extension 2X10 each High march hold 2X10 with 3 holds 3 way leg kicks 3 holds bil UE assist 5X3 Squats to chair 2 x 10 Balance board F/B and S/S x 2' stable as long as possible in middle intermittent UE  assist Slant board 5 x 30   10/11/23 Nustep seat 6 x 5' level 3 dynamic warm up Supine manual long axis distraction 10 hold x 10 LTR x 10 Abdominal bracing with Bent knee fall outs 2 x 10 Sidelying clam for right hip 2 x 10 Prone heel squeezes 5 hold x 10 Standing: Heel raises 2 x 10 Toe raises 2 x 10 Goblet squat with 5# kettlebell painful so d/c  Squats to chair 2 x 10 Balance board F/B and S/S x 1' each minimal working to no UE assist [employer] 5 x 20   PATIENT EDUCATION:  Education details: Patient educated on exam findings, POC, scope of PT, HEP, and what to expect next visit. Person educated: Patient Education method: Explanation, Demonstration, and Handouts Education comprehension: verbalized understanding, returned demonstration, verbal cues required, and tactile cues required HOME EXERCISE PROGRAM: Access Code: [facility] URL: [email or web address] Date: 09/18/2023 Prepared by: [facility] - Rehab  Exercises - Standing Lumbar Extension with Counter  - 8 x daily - 7 x weekly - 1 sets - 10 reps - Standing Lumbar Extension  - 8 x daily - 7 x weekly - 1 sets - 10 reps  Access Code: [email or web address] URL: [email or web address] Date: 09/20/2023 Prepared by: Lang Ada  Exercises - Supine Heel Slide with Strap  - 1 x daily - 7 x weekly - 3 sets - 10 reps - Supine Bridge  - 1 x daily - 7 x weekly - 3 sets -  10 reps - 3 hold - Supine Lower Trunk Rotation  - 1 x daily - 7 x weekly - 3 sets - 10 reps - Supine Posterior Pelvic Tilt  - 1 x daily - 7 x weekly - 3 sets - 10 reps - Hooklying Isometric Hip Abduction with Belt  - 1 x daily - 7 x weekly - 3 sets - 10 reps - 5 hold  Exercises - Side Stepping with Resistance at Thighs  - 2 x daily - 7 x weekly - 3 sets - 10 reps   ASSESSMENT:  CLINICAL IMPRESSION: Pt arrives to session with reports of little pain in R hip this date. Patient reports she still has a little difficulty with stair navigation and  clamshells from her HEP. During stair activities, patient reports very little pain in R hip joint. Patient also fearful of putting full weight. Encouraged to decrease UE support with each set. Pt demo inc confidence eventually able to navigate without UE support. . Followed with strengthening of glute medius with side stepping with resistance and progressed with mini squats. Pt tolerated well. Pt later reports she had some issues with SLR but was able to complete it last night. Followed with obstacle navigation focused on hip flexion and abduction. Pt cont to demo some hesitation and very mild pain but improves with duration of activity. Ended with dynamic cool down on Nustep. Patient will benefit from continued stilled therapy services to address remaining unmet and partially met goals.      Eval: Patient is a 28 y.o. female who was seen today for physical therapy evaluation and treatment for M25.551,G89.29 (ICD-10-CM) - Chronic right hip painM25.551,G89.29 (ICD-10-CM) - Chronic right hip pain. . Patient demonstrates muscle weakness, reduced ROM, and fascial restrictions which are likely contributing to symptoms of pain and are negatively impacting patient ability to perform ADLs and functional mobility tasks. Patient will benefit from skilled physical therapy services to address these deficits to reduce pain and improve level of function with ADLs and functional mobility tasks.   OBJECTIVE IMPAIRMENTS: Abnormal gait, decreased activity tolerance, decreased balance, decreased knowledge of condition, decreased knowledge of use of DME, decreased mobility, difficulty walking, decreased ROM, decreased strength, increased fascial restrictions, impaired perceived functional ability, postural dysfunction, and pain.   ACTIVITY LIMITATIONS: carrying, lifting, bending, standing, sleeping, stairs, transfers, bed mobility, and locomotion level  PARTICIPATION LIMITATIONS: meal prep, cleaning, laundry, shopping,  community activity, and occupation  REHAB POTENTIAL: Good  CLINICAL DECISION MAKING: Evolving/moderate complexity  EVALUATION COMPLEXITY: Moderate   GOALS: Goals reviewed with patient? Yes  SHORT TERM GOALS: Target date: 10/02/2023 patient will be independent with initial HEP  Baseline: Goal status: met  2.  Patient will report 50% improvement overall  Baseline:  Goal status: met LONG TERM GOALS: Target date: 10/18/2023   Patient will be independent in self management strategies to improve quality of life and functional outcomes.  Baseline:  Goal status: INITIAL  2.  Patient will report 80% improvement overall  Baseline:  Goal status: INITIAL  3.  Patient will improve LEFS score by 30 points to demonstrate improved perceived function  Baseline: 12/80; 32/80 10/09/23 Goal status: INITIAL  4.  Patient will improve TUG score to 10 sec or less to demonstrate improved functional mobility and decreased fall risk  Baseline: 22.21; 11.68 sec Goal status: INITIAL  5.  Patient will improve 5 times sit to stand score to 15 sec or less to demonstrate improved functional mobility and increased leg strength.  Baseline: 23.84 sec; 13.99 sec Goal status:  met    PLAN:  PT FREQUENCY: 2x/week  PT DURATION: 4 weeks  PLANNED INTERVENTIONS: 97164- PT Re-evaluation, 97110-Therapeutic exercises, 97530- Therapeutic activity, 97112- Neuromuscular re-education, 97535- Self Care, [id number]- Manual therapy, [id number]- Gait training, [phone]- Orthotic Fit/training, [phone]- Canalith repositioning, [id number]- Aquatic Therapy, [phone]- Splinting, [phone]- Wound care (first 20 sq cm), 97598- Wound care (each additional 20 sq cm)Patient/Family education, Balance training, Stair training, Taping, Dry Needling, Joint mobilization, Joint manipulation, Spinal manipulation, Spinal mobilization, Scar mobilization, and DME instructions.   PLAN FOR NEXT SESSION: continue to progress LE strength and stability as well  as core.  Update HEP as needed.        10:16 AM, 10/17/23 Rosaria Settler, PT, DPT [facility] [place]

## 2023-10-22 ENCOUNTER — Ambulatory Visit (HOSPITAL_COMMUNITY)

## 2023-10-22 ENCOUNTER — Encounter (HOSPITAL_COMMUNITY): Payer: Self-pay

## 2023-10-22 DIAGNOSIS — M25551 Pain in right hip: Secondary | ICD-10-CM | POA: Diagnosis not present

## 2023-10-22 DIAGNOSIS — R262 Difficulty in walking, not elsewhere classified: Secondary | ICD-10-CM

## 2023-10-22 NOTE — Therapy (Signed)
 OUTPATIENT PHYSICAL THERAPY LOWER EXTREMITY TREATMENT  Patient Name: Jacqueline Koch MRN: [id number] DOB:November 26, 1995, 28 y.o., female Today's Date: 10/22/2023  END OF SESSION:  PT End of Session - 10/22/23 1021     Visit Number 11    Number of Visits 13    Date for PT Re-Evaluation 11/08/23    Authorization Type [place] Medicaid Prepaid    Authorization Time Period healthy blue approved 6 visits from 10/11/2023-12/09/2023 [facility]    Authorization - Visit Number 4    Authorization - Number of Visits 6    Progress Note Due on Visit 13    PT Start Time 1021    PT Stop Time 1059    PT Time Calculation (min) 38 min    Activity Tolerance Patient tolerated treatment well    Behavior During Therapy [facility] for tasks assessed/performed            Past Medical History:  Diagnosis Date   Kidney stone    Nexplanon  removal 06/09/2014   Syncope    History reviewed. No pertinent surgical history. Patient Active Problem List   Diagnosis Date Noted   Closed nondisplaced fracture of shaft of left clavicle 03/30/2020   Nondisplaced fracture of body of left scapula with routine healing 03/30/2020   Nexplanon  insertion 08/22/2015   Susceptible to varicella (non-immune), currently pregnant 12/13/2014   Marijuana use 12/08/2014   Chlamydia infection 01/20/2013   Adolescent scoliosis 12/01/2012   Abnormal vision screen 12/01/2012   Hearing deficit 12/01/2012   Scoliosis 11/02/2010   Back pain 11/02/2010    PCP: none  REFERRING PROVIDER: Burnetta Brunet, DO REFERRING DIAG: [phone] (ICD-10-CM) - Chronic right hip pain  THERAPY DIAG:  Pain in right hip  Difficulty in walking, not elsewhere classified  Rationale for Evaluation and Treatment: Rehabilitation  ONSET DATE: 4 months  SUBJECTIVE:   SUBJECTIVE STATEMENT: Pt reports no pain today. Reports she hasn't tried clamshells since last visit, only side stepping and that went fine. Reports she felt fine after last session. Has MRI  tomorrow at 215 pm.    MD visit: 10/01/23 - Plan: Impression is chronic right hip pain with associated limp which has had significant improvement in pain but still having a limp/gait abnormality.  She did have intra-articular hip injection about 6 weeks ago and only has very limited pain about the hip with walking and mild pain/weakness within the hip/groin when first waking.  Her symptoms do suggest she may have a labral abnormality about the hip, but I am unsure why she still has a functional gait abnormality.  She has no leg length discrepancy.  She does have a mild thoracolumbar scoliosis but this is not advanced enough to cause a significant gait abnormality and this has only been present since her hip pain began.  She otherwise neurologically is intact and has full muscle strength of her left leg and upper extremities. Given this, I would like to move forward with an MRI arthrogram of the right hip to evaluate the labrum and other intra-articular pathology.  She may use ibuprofen  as needed.  She will continue her PT and her home exercises.   If for some reason the MRI/MRI of the hip does not show significant pathology that would explain the above, we discussed other workup considerations including EMG/nerve conduction study of the lower extremities to rule out any muscle related abnormality causing her weakness/gait change.  Eval: Woke up one day and her right hip was hurting and has progressively got worse over  time.  She is limping a significant amount on arrival; arrives with her daughter.  Went to ED first; referred to Dr. Onesimo.  He did x-rays and gave prednisone  and that helped a little but then came right back; then referred to Dr. Burnetta.    PERTINENT HISTORY:  PAIN:  Are you having pain? Yes: NPRS scale: 9/10 with walking; 3-4 when sitting Pain location: right side groin and right leg down to knee Pain description: tight,  Aggravating factors: standing, walking Relieving factors:  sitting down  PRECAUTIONS: None  RWEIGHT BEARING RESTRICTIONS: No  FALLS:  Has patient fallen in last 6 months? No  OCCUPATION: works at [employer]  PLOF: Independent  PATIENT GOALS: ready to walk normal  NEXT MD VISIT: next week sees Dr. Burnetta  OBJECTIVE:  Note: Objective measures were completed at Evaluation unless otherwise noted.  DIAGNOSTIC FINDINGS:  X-rays of the right hip were obtained in clinic today.  No acute injuries are noted.  No degenerative changes.  No evidence of AVN.  No bony lesions.   Impression: Negative right hip x-rays  Complete lumbar film x-rays including AP, lateral, flexion/extension views  were ordered and reviewed by myself today.  X-rays demonstrate a mild  thoracolumbar levoscoliosis.  There are 5 nonrib-bearing lumbar vertebra  with preserved intervertebral disc spacing.  No notable DDD or acute bony  abnormality noted.    PATIENT SURVEYS:  LEFS  Extreme difficulty/unable (0), Quite a bit of difficulty (1), Moderate difficulty (2), Little difficulty (3), No difficulty (4) Survey date:    Score total:  12/80; 15%     COGNITION: Overall cognitive status: Within functional limits for tasks assessed     SENSATION: WFL  EDEMA:  None noted   POSTURE: increased lumbar lordosis and weight shift left  PALPATION: Tender right greater troch; glute; piriformis  LOWER EXTREMITY ROM:  Active ROM Right 09/24/23 Left 09/24/23 Right 10/09/23  Hip flexion 95 105   Hip extension     Hip abduction     Hip adduction     Hip internal rotation 35 - end range painful  WFL Pull front of thigh   Hip external rotation *Restricted secondary to pain - 10 degrees WFL 33  Knee flexion   138  Knee extension     Ankle dorsiflexion     Ankle plantarflexion     Ankle inversion     Ankle eversion      (Blank rows = not tested)  LOWER EXTREMITY MMT:* mild pain  MMT Right eval Left eval Right 10/09/23  Hip flexion 2 4+ 4+*  Hip extension     Hip  abduction     Hip adduction     Hip internal rotation     Hip external rotation     Knee flexion     Knee extension 4+ 5 5  Ankle dorsiflexion 4+ 5 5  Ankle plantarflexion     Ankle inversion     Ankle eversion        (Blank rows = not tested) LUMBAR ROM: *pain  Active  AROM  eval AROM  10/09/23  Flexion Fingertips* to mid shin painful right anterior hip Fingertips to anterior ankle No pain  Extension 70% available no pain Wfl no pain  Right lateral flexion Fingertips to knee joint line   Left lateral flexion Fingertips to knee joint line   Right rotation    Left rotation     (Blank rows = not tested)   FUNCTIONAL TESTS:  5 times sit to stand: 23.84 using hands to assist up; leaning to the left Timed up and go (TUG): 22.21 sec heavy limp  GAIT: Distance walked: 50 ft in clinic Assistive device utilized: None Level of assistance: SBA Comments: heavy limp; improved with trial of using SPC                                                                                                                               TREATMENT DATE:  10/22/23:  Lateral step downs, 6 inch step, 2x10  Lateral toes taps, 2x10, 4 inch step Dead lift, 15 lb, verbal cues for form, 2x10  Staggered stance, RLE posterior, 15x  Walking lunges, 10 ftx6 Tandem balance, 30 each LE leading SLS: LLE: 30, RLE 30 Monster Walks, GTB at knees, 20 ftx2 Forward marching, GTB at knees, 20 ftx2   10/17/23: Squats from chair height, 2x10 Forward steps, 4 inch step, 10x with UE support  10x without UE support Step overs, 4 inch step, 10x ascending with LLE and descending with RLE  2 inch step, 10x with RLE ascending and LLE descending Side stepping, 20 ftx2, with red TB  + mini squat, 20 ftx2 Obstacle navigation, three 6 inch obstacles in // bars: Forward steps 2x down and back  Side steps, 2x down and back  Nustep seat 6 x 5' level 4 dynamic warm up  10/15/23 Nustep seat 6 x 5' level 3 dynamic warm  up Standing: Heel raises 2 x 10 on incline Toe raises 2 x 10 on decline Hip abduction 2X10 each Hip extension 2X10 each High march hold 2X10 with 3 holds 3 way leg kicks 3 holds bil UE assist 5X3 Squats to chair 2 x 10 Balance board F/B and S/S x 2' stable as long as possible in middle intermittent UE assist Slant board 5 x 30    PATIENT EDUCATION:  Education details: Patient educated on exam findings, POC, scope of PT, HEP, and what to expect next visit. Person educated: Patient Education method: Explanation, Demonstration, and Handouts Education comprehension: verbalized understanding, returned demonstration, verbal cues required, and tactile cues required HOME EXERCISE PROGRAM: Access Code: [facility] URL: [email or web address] Date: 09/18/2023 Prepared by: [facility] - Rehab  Exercises - Standing Lumbar Extension with Counter  - 8 x daily - 7 x weekly - 1 sets - 10 reps - Standing Lumbar Extension  - 8 x daily - 7 x weekly - 1 sets - 10 reps  Access Code: [email or web address] URL: [email or web address] Date: 09/20/2023 Prepared by: Lang Ada  Exercises - Supine Heel Slide with Strap  - 1 x daily - 7 x weekly - 3 sets - 10 reps - Supine Bridge  - 1 x daily - 7 x weekly - 3 sets - 10 reps - 3 hold - Supine Lower Trunk Rotation  - 1 x daily - 7 x weekly - 3 sets - 10 reps - Supine Posterior Pelvic Tilt  - 1  x daily - 7 x weekly - 3 sets - 10 reps - Hooklying Isometric Hip Abduction with Belt  - 1 x daily - 7 x weekly - 3 sets - 10 reps - 5 hold  Exercises - Side Stepping with Resistance at Thighs  - 2 x daily - 7 x weekly - 3 sets - 10 reps   ASSESSMENT:  CLINICAL IMPRESSION: Pt continued to demonstrate little to no pain at arrival to PT session. Began with general strengthening with focus on full weight bearing in RLE. Verbal cues are given throughout for form with pt demonstrating good carryover. Patient reports fatigue induced shakiness in R hip with  progressed activity but tolerates well with no increased pain. During walking lunges, patient reports very mild inc in R hip discomfort at 1/10. Verbal and visual cues needed to limit forward tibial translation of forward LE. Patient demonstrates good balance with full tandem and SL. Notable increased UE shoulder abduction for stability with SLS on RLE. Patient reports annoying pain in anterior aspect of R hip during hip flexion section of gait cycle. Ended with strengthening hip flexors to address. Ended session with 1/10. Patient will benefit from continued stilled therapy services to address remaining unmet and partially met goals.       Eval: Patient is a 28 y.o. female who was seen today for physical therapy evaluation and treatment for M25.551,G89.29 (ICD-10-CM) - Chronic right hip painM25.551,G89.29 (ICD-10-CM) - Chronic right hip pain. . Patient demonstrates muscle weakness, reduced ROM, and fascial restrictions which are likely contributing to symptoms of pain and are negatively impacting patient ability to perform ADLs and functional mobility tasks. Patient will benefit from skilled physical therapy services to address these deficits to reduce pain and improve level of function with ADLs and functional mobility tasks.   OBJECTIVE IMPAIRMENTS: Abnormal gait, decreased activity tolerance, decreased balance, decreased knowledge of condition, decreased knowledge of use of DME, decreased mobility, difficulty walking, decreased ROM, decreased strength, increased fascial restrictions, impaired perceived functional ability, postural dysfunction, and pain.   ACTIVITY LIMITATIONS: carrying, lifting, bending, standing, sleeping, stairs, transfers, bed mobility, and locomotion level  PARTICIPATION LIMITATIONS: meal prep, cleaning, laundry, shopping, community activity, and occupation  REHAB POTENTIAL: Good  CLINICAL DECISION MAKING: Evolving/moderate complexity  EVALUATION COMPLEXITY:  Moderate   GOALS: Goals reviewed with patient? Yes  SHORT TERM GOALS: Target date: 10/02/2023 patient will be independent with initial HEP  Baseline: Goal status: met  2.  Patient will report 50% improvement overall  Baseline:  Goal status: met LONG TERM GOALS: Target date: 11/06/2023   Patient will be independent in self management strategies to improve quality of life and functional outcomes.  Baseline:  Goal status: INITIAL  2.  Patient will report 80% improvement overall  Baseline:  Goal status: INITIAL  3.  Patient will improve LEFS score by 30 points to demonstrate improved perceived function  Baseline: 12/80; 32/80 10/09/23 Goal status: INITIAL  4.  Patient will improve TUG score to 10 sec or less to demonstrate improved functional mobility and decreased fall risk  Baseline: 22.21; 11.68 sec Goal status: INITIAL  5.  Patient will improve 5 times sit to stand score to 15 sec or less to demonstrate improved functional mobility and increased leg strength.    Baseline: 23.84 sec; 13.99 sec Goal status:  met    PLAN:  PT FREQUENCY: 2x/week  PT DURATION: 4 weeks  PLANNED INTERVENTIONS: 97164- PT Re-evaluation, 97110-Therapeutic exercises, 97530- Therapeutic activity, [id number]- Neuromuscular re-education, 97535- Self Care,  02859- Manual therapy, [id number]- Gait training, 02239- Orthotic Fit/training, 04007- Canalith repositioning, 02886- Aquatic Therapy, [id number]- Splinting, 02402- Wound care (first 20 sq cm), 97598- Wound care (each additional 20 sq cm)Patient/Family education, Balance training, Stair training, Taping, Dry Needling, Joint mobilization, Joint manipulation, Spinal manipulation, Spinal mobilization, Scar mobilization, and DME instructions.   PLAN FOR NEXT SESSION: continue to progress LE strength and stability as well as core.  Update HEP as needed.        11:00 AM, 10/22/23 Rosaria Settler, PT, DPT [facility] [place]

## 2023-10-23 ENCOUNTER — Ambulatory Visit (HOSPITAL_COMMUNITY)
Admission: RE | Admit: 2023-10-23 | Discharge: 2023-10-23 | Disposition: A | Discharge status: Home / Self Care | Source: Ambulatory Visit | Attending: Sports Medicine | Admitting: Sports Medicine

## 2023-10-23 ENCOUNTER — Encounter (HOSPITAL_COMMUNITY): Payer: Self-pay

## 2023-10-23 DIAGNOSIS — G8929 Other chronic pain: Secondary | ICD-10-CM

## 2023-10-23 DIAGNOSIS — R29898 Other symptoms and signs involving the musculoskeletal system: Secondary | ICD-10-CM

## 2023-10-23 DIAGNOSIS — M25551 Pain in right hip: Secondary | ICD-10-CM | POA: Insufficient documentation

## 2023-10-23 MED ORDER — LIDOCAINE HCL (PF) 1 % IJ SOLN
INTRAMUSCULAR | Status: AC
  Filled 2023-10-23: qty 5

## 2023-10-23 MED ORDER — IOHEXOL 180 MG/ML  SOLN
15.0000 mL | Freq: Once | INTRAMUSCULAR | Status: AC | PRN
  Administered 2023-10-23: 15 mL

## 2023-10-23 MED ORDER — GADOBUTROL 1 MMOL/ML IV SOLN
0.0500 mL | Freq: Once | INTRAVENOUS | Status: AC | PRN
  Administered 2023-10-23: 0.05 mL

## 2023-10-23 MED ORDER — LIDOCAINE HCL (PF) 1 % IJ SOLN
10.0000 mL | Freq: Once | INTRAMUSCULAR | Status: AC
  Administered 2023-10-23: 10 mL via INTRADERMAL

## 2023-10-24 ENCOUNTER — Ambulatory Visit: Payer: Self-pay | Admitting: Sports Medicine

## 2023-10-24 ENCOUNTER — Ambulatory Visit (INDEPENDENT_AMBULATORY_CARE_PROVIDER_SITE_OTHER): Admitting: Physical Therapy

## 2023-10-24 DIAGNOSIS — M25551 Pain in right hip: Secondary | ICD-10-CM | POA: Diagnosis not present

## 2023-10-24 DIAGNOSIS — R262 Difficulty in walking, not elsewhere classified: Secondary | ICD-10-CM

## 2023-10-24 NOTE — Therapy (Signed)
 OUTPATIENT PHYSICAL THERAPY LOWER EXTREMITY TREATMENT  Patient Name: Jacqueline Koch MRN: [id number] DOB:[birth year], 28 y.o., female Today's Date: 10/24/2023  END OF SESSION:  PT End of Session - 10/24/23 0928     Visit Number 12    Number of Visits 13    Date for PT Re-Evaluation 11/08/23    Authorization Type [place] Medicaid Prepaid    Authorization Time Period healthy blue approved 6 visits from 10/11/2023-12/09/2023 [facility]    Authorization - Visit Number 5    Authorization - Number of Visits 6    Progress Note Due on Visit 13    PT Start Time 0848    PT Stop Time 0930    PT Time Calculation (min) 42 min    Activity Tolerance Patient tolerated treatment well    Behavior During Therapy [facility] for tasks assessed/performed             Past Medical History:  Diagnosis Date   Kidney stone    Nexplanon  removal 06/09/2014   Syncope    No past surgical history on file. Patient Active Problem List   Diagnosis Date Noted   Closed nondisplaced fracture of shaft of left clavicle 03/30/2020   Nondisplaced fracture of body of left scapula with routine healing 03/30/2020   Nexplanon  insertion 08/22/2015   Susceptible to varicella (non-immune), currently pregnant 12/13/2014   Marijuana use 12/08/2014   Chlamydia infection 01/20/2013   Adolescent scoliosis 12/01/2012   Abnormal vision screen 12/01/2012   Hearing deficit 12/01/2012   Scoliosis 11/02/2010   Back pain 11/02/2010    PCP: none  REFERRING PROVIDER: Burnetta Brunet, DO REFERRING DIAG: [phone] (ICD-10-CM) - Chronic right hip pain  THERAPY DIAG:  Pain in right hip  Difficulty in walking, not elsewhere classified  Rationale for Evaluation and Treatment: Rehabilitation  ONSET DATE: 4 months  SUBJECTIVE:   SUBJECTIVE STATEMENT: Pt reports MRI completed yesterday (see impression below); states the injection of the dye was painful.  Currently having 2/10 pain in Rt hip.  Continues to present with antalgic  gait but no AD.   MRI completed 10/23/23 IMPRESSION: Significant edema in the right femoral head with suggestion of a possible subchondral microfracture in the superior femoral head. Correlation with CT may be beneficial. There is no evidence of an acetabular abnormality. No labral tear as questioned.   MD visit: 10/01/23 - Plan: Impression is chronic right hip pain with associated limp which has had significant improvement in pain but still having a limp/gait abnormality.  She did have intra-articular hip injection about 6 weeks ago and only has very limited pain about the hip with walking and mild pain/weakness within the hip/groin when first waking.  Her symptoms do suggest she may have a labral abnormality about the hip, but I am unsure why she still has a functional gait abnormality.  She has no leg length discrepancy.  She does have a mild thoracolumbar scoliosis but this is not advanced enough to cause a significant gait abnormality and this has only been present since her hip pain began.  She otherwise neurologically is intact and has full muscle strength of her left leg and upper extremities. Given this, I would like to move forward with an MRI arthrogram of the right hip to evaluate the labrum and other intra-articular pathology.  She may use ibuprofen  as needed.  She will continue her PT and her home exercises.   If for some reason the MRI/MRI of the hip does not show significant pathology that would  explain the above, we discussed other workup considerations including EMG/nerve conduction study of the lower extremities to rule out any muscle related abnormality causing her weakness/gait change.  Eval: Woke up one day and her right hip was hurting and has progressively got worse over time.  She is limping a significant amount on arrival; arrives with her daughter.  Went to ED first; referred to Dr. Onesimo.  He did x-rays and gave prednisone  and that helped a little but then came right back;  then referred to Dr. Burnetta.    PERTINENT HISTORY:  PAIN:  Are you having pain? Yes: NPRS scale: 9/10 with walking; 3-4 when sitting Pain location: right side groin and right leg down to knee Pain description: tight,  Aggravating factors: standing, walking Relieving factors: sitting down  PRECAUTIONS: None  RWEIGHT BEARING RESTRICTIONS: No  FALLS:  Has patient fallen in last 6 months? No  OCCUPATION: works at [employer]  PLOF: Independent  PATIENT GOALS: ready to walk normal  NEXT MD VISIT: next week sees Dr. Burnetta  OBJECTIVE:  Note: Objective measures were completed at Evaluation unless otherwise noted.  DIAGNOSTIC FINDINGS:  X-rays of the right hip were obtained in clinic today.  No acute injuries are noted.  No degenerative changes.  No evidence of AVN.  No bony lesions.   Impression: Negative right hip x-rays  Complete lumbar film x-rays including AP, lateral, flexion/extension views  were ordered and reviewed by myself today.  X-rays demonstrate a mild  thoracolumbar levoscoliosis.  There are 5 nonrib-bearing lumbar vertebra  with preserved intervertebral disc spacing.  No notable DDD or acute bony  abnormality noted.    PATIENT SURVEYS:  LEFS  Extreme difficulty/unable (0), Quite a bit of difficulty (1), Moderate difficulty (2), Little difficulty (3), No difficulty (4) Survey date:    Score total:  12/80; 15%     COGNITION: Overall cognitive status: Within functional limits for tasks assessed     SENSATION: WFL  EDEMA:  None noted   POSTURE: increased lumbar lordosis and weight shift left  PALPATION: Tender right greater troch; glute; piriformis  LOWER EXTREMITY ROM:  Active ROM Right 09/24/23 Left 09/24/23 Right 10/09/23  Hip flexion 95 105   Hip extension     Hip abduction     Hip adduction     Hip internal rotation 35 - end range painful  WFL Pull front of thigh   Hip external rotation *Restricted secondary to pain - 10 degrees WFL 33  Knee  flexion   138  Knee extension     Ankle dorsiflexion     Ankle plantarflexion     Ankle inversion     Ankle eversion      (Blank rows = not tested)  LOWER EXTREMITY MMT:* mild pain  MMT Right eval Left eval Right 10/09/23  Hip flexion 2 4+ 4+*  Hip extension     Hip abduction     Hip adduction     Hip internal rotation     Hip external rotation     Knee flexion     Knee extension 4+ 5 5  Ankle dorsiflexion 4+ 5 5  Ankle plantarflexion     Ankle inversion     Ankle eversion        (Blank rows = not tested) LUMBAR ROM: *pain  Active  AROM  eval AROM  10/09/23  Flexion Fingertips* to mid shin painful right anterior hip Fingertips to anterior ankle No pain  Extension 70% available no pain  Wfl no pain  Right lateral flexion Fingertips to knee joint line   Left lateral flexion Fingertips to knee joint line   Right rotation    Left rotation     (Blank rows = not tested)   FUNCTIONAL TESTS:  5 times sit to stand: 23.84 using hands to assist up; leaning to the left Timed up and go (TUG): 22.21 sec heavy limp  GAIT: Distance walked: 50 ft in clinic Assistive device utilized: None Level of assistance: SBA Comments: heavy limp; improved with trial of using SPC                                                                                                                               TREATMENT DATE:  10/22/23:  Standing in // bars, bil UE assist Lateral step downs, 6 inch step, 2x10 bil  Lateral toes taps, 2x10 bil, 4 inch step  Forward lunges onto 6 step, no UE,  2X10 each  3-way Towel slides 10X each LE with 1 UE assist Walking lunges, 45ft line, 2RT Monster walks, GTB at knees 51ft line, 2RT Lateral stepping, GTB at knees, 35ft line 2RT  10/17/23: Squats from chair height, 2x10 Forward steps, 4 inch step, 10x with UE support  10x without UE support Step overs, 4 inch step, 10x ascending with LLE and descending with RLE  2 inch step, 10x with RLE ascending and  LLE descending Side stepping, 20 ftx2, with red TB  + mini squat, 20 ftx2 Obstacle navigation, three 6 inch obstacles in // bars: Forward steps 2x down and back  Side steps, 2x down and back  Nustep seat 6 x 5' level 4 dynamic warm up  10/15/23 Nustep seat 6 x 5' level 3 dynamic warm up Standing: Heel raises 2 x 10 on incline Toe raises 2 x 10 on decline Hip abduction 2X10 each Hip extension 2X10 each High march hold 2X10 with 3 holds 3 way leg kicks 3 holds bil UE assist 5X3 Squats to chair 2 x 10 Balance board F/B and S/S x 2' stable as long as possible in middle intermittent UE assist Slant board 5 x 30    PATIENT EDUCATION:  Education details: Patient educated on exam findings, POC, scope of PT, HEP, and what to expect next visit. Person educated: Patient Education method: Explanation, Demonstration, and Handouts Education comprehension: verbalized understanding, returned demonstration, verbal cues required, and tactile cues required HOME EXERCISE PROGRAM: Access Code: [facility] URL: [email or web address] Date: 09/18/2023 Prepared by: AP - Rehab  Exercises - Standing Lumbar Extension with Counter  - 8 x daily - 7 x weekly - 1 sets - 10 reps - Standing Lumbar Extension  - 8 x daily - 7 x weekly - 1 sets - 10 reps  Access Code: [email or web address] URL: [email or web address] Date: 09/20/2023 Prepared by: Lang Ada  Exercises - Supine Heel Slide with Strap  - 1 x daily - 7 x weekly - 3 sets -  10 reps - Supine Bridge  - 1 x daily - 7 x weekly - 3 sets - 10 reps - 3 hold - Supine Lower Trunk Rotation  - 1 x daily - 7 x weekly - 3 sets - 10 reps - Supine Posterior Pelvic Tilt  - 1 x daily - 7 x weekly - 3 sets - 10 reps - Hooklying Isometric Hip Abduction with Belt  - 1 x daily - 7 x weekly - 3 sets - 10 reps - 5 hold  Exercises - Side Stepping with Resistance at Thighs  - 2 x daily - 7 x weekly - 3 sets - 10 reps   ASSESSMENT:  CLINICAL  IMPRESSION: MRI reveals possible microfracture but no labral tear or any other issues.  Continued with Focus on Rt LE stabilization and strengthening.  Began towel slide with overall good form, however very challenging with Rt LE stance, reduced ROM sliding in each direction due to weakness.  Pt was able to complete these with 1 UE only.  Several short rest breaks due to discomfort, especially with lunge and towel slide activity.  Pt required cues to keep Rt foot neutral as tends to internally rotate.  Pt did require one seated rest break which she initially sat with weight shifted to Lt initially.   No other issues or complaints during session today. Verbal cues given throughout for form with pt demonstrating good carryover. Patient will benefit from continued stilled therapy services to address remaining unmet and partially met goals.     Eval: Patient is a 28 y.o. female who was seen today for physical therapy evaluation and treatment for M25.551,G89.29 (ICD-10-CM) - Chronic right hip painM25.551,G89.29 (ICD-10-CM) - Chronic right hip pain. . Patient demonstrates muscle weakness, reduced ROM, and fascial restrictions which are likely contributing to symptoms of pain and are negatively impacting patient ability to perform ADLs and functional mobility tasks. Patient will benefit from skilled physical therapy services to address these deficits to reduce pain and improve level of function with ADLs and functional mobility tasks.   OBJECTIVE IMPAIRMENTS: Abnormal gait, decreased activity tolerance, decreased balance, decreased knowledge of condition, decreased knowledge of use of DME, decreased mobility, difficulty walking, decreased ROM, decreased strength, increased fascial restrictions, impaired perceived functional ability, postural dysfunction, and pain.   ACTIVITY LIMITATIONS: carrying, lifting, bending, standing, sleeping, stairs, transfers, bed mobility, and locomotion level  PARTICIPATION  LIMITATIONS: meal prep, cleaning, laundry, shopping, community activity, and occupation  REHAB POTENTIAL: Good  CLINICAL DECISION MAKING: Evolving/moderate complexity  EVALUATION COMPLEXITY: Moderate   GOALS: Goals reviewed with patient? Yes  SHORT TERM GOALS: Target date: 10/02/2023 patient will be independent with initial HEP  Baseline: Goal status: met  2.  Patient will report 50% improvement overall  Baseline:  Goal status: met LONG TERM GOALS: Target date: 11/06/2023   Patient will be independent in self management strategies to improve quality of life and functional outcomes.  Baseline:  Goal status: INITIAL  2.  Patient will report 80% improvement overall  Baseline:  Goal status: INITIAL  3.  Patient will improve LEFS score by 30 points to demonstrate improved perceived function  Baseline: 12/80; 32/80 10/09/23 Goal status: INITIAL  4.  Patient will improve TUG score to 10 sec or less to demonstrate improved functional mobility and decreased fall risk  Baseline: 22.21; 11.68 sec Goal status: INITIAL  5.  Patient will improve 5 times sit to stand score to 15 sec or less to demonstrate improved functional mobility and increased  leg strength.    Baseline: 23.84 sec; 13.99 sec Goal status:  met    PLAN:  PT FREQUENCY: 2x/week  PT DURATION: 4 weeks  PLANNED INTERVENTIONS: 97164- PT Re-evaluation, 97110-Therapeutic exercises, 97530- Therapeutic activity, 97112- Neuromuscular re-education, 97535- Self Care, [id number]- Manual therapy, [id number]- Gait training, [phone]- Orthotic Fit/training, [phone]- Canalith repositioning, [id number]- Aquatic Therapy, [phone]- Splinting, [phone]- Wound care (first 20 sq cm), 97598- Wound care (each additional 20 sq cm)Patient/Family education, Balance training, Stair training, Taping, Dry Needling, Joint mobilization, Joint manipulation, Spinal manipulation, Spinal mobilization, Scar mobilization, and DME instructions.   PLAN FOR NEXT SESSION:  continue to progress LE strength and stability as well as core.  Update HEP as needed. Reassess next session.       9:28 AM, 10/24/23 Greig KATHEE Fuse, PTA/CLT [facility] Ph: [phone]

## 2023-10-25 ENCOUNTER — Other Ambulatory Visit: Payer: Self-pay | Admitting: Sports Medicine

## 2023-10-25 DIAGNOSIS — G8929 Other chronic pain: Secondary | ICD-10-CM

## 2023-10-28 ENCOUNTER — Encounter: Payer: Self-pay | Admitting: Sports Medicine

## 2023-10-29 ENCOUNTER — Encounter (HOSPITAL_COMMUNITY): Payer: Self-pay

## 2023-10-29 ENCOUNTER — Ambulatory Visit
Admission: RE | Admit: 2023-10-29 | Discharge: 2023-10-29 | Disposition: A | Discharge status: Home / Self Care | Source: Ambulatory Visit | Attending: Sports Medicine | Admitting: Sports Medicine

## 2023-10-29 ENCOUNTER — Ambulatory Visit (HOSPITAL_COMMUNITY)

## 2023-10-29 DIAGNOSIS — G8929 Other chronic pain: Secondary | ICD-10-CM

## 2023-10-29 DIAGNOSIS — M25551 Pain in right hip: Secondary | ICD-10-CM

## 2023-10-29 DIAGNOSIS — R262 Difficulty in walking, not elsewhere classified: Secondary | ICD-10-CM

## 2023-10-29 NOTE — Therapy (Signed)
 OUTPATIENT PHYSICAL THERAPY LOWER EXTREMITY TREATMENT/PN/RE-EVAL  Patient Name: Jacqueline Koch MRN: [id number] DOB:Jun 10, 1995, 28 y.o., female Today's Date: 10/29/2023  Progress Note Reporting Period 10/11/23 to 10/29/23  See note below for Objective Data and Assessment of Progress/Goals.      END OF SESSION:  PT End of Session - 10/29/23 1031     Visit Number 13    Number of Visits 20    Date for Recertification  11/21/23    Authorization Type [place] Medicaid Prepaid    Authorization Time Period healthy blue approved 6 visits from 10/11/2023-12/09/2023, new auth requested 9/23    Authorization - Visit Number 6    Authorization - Number of Visits 6    Progress Note Due on Visit 20    PT Start Time 1032    PT Stop Time 1110    PT Time Calculation (min) 38 min    Activity Tolerance Patient tolerated treatment well    Behavior During Therapy [facility] for tasks assessed/performed            Past Medical History:  Diagnosis Date   Kidney stone    Nexplanon  removal 06/09/2014   Syncope    History reviewed. No pertinent surgical history. Patient Active Problem List   Diagnosis Date Noted   Closed nondisplaced fracture of shaft of left clavicle 03/30/2020   Nondisplaced fracture of body of left scapula with routine healing 03/30/2020   Nexplanon  insertion 08/22/2015   Susceptible to varicella (non-immune), currently pregnant 12/13/2014   Marijuana use 12/08/2014   Chlamydia infection 01/20/2013   Adolescent scoliosis 12/01/2012   Abnormal vision screen 12/01/2012   Hearing deficit 12/01/2012   Scoliosis 11/02/2010   Back pain 11/02/2010    PCP: none  REFERRING PROVIDER: Burnetta Brunet, DO REFERRING DIAG: [phone] (ICD-10-CM) - Chronic right hip pain  THERAPY DIAG:  Pain in right hip  Difficulty in walking, not elsewhere classified  Rationale for Evaluation and Treatment: Rehabilitation  ONSET DATE: 4 months  SUBJECTIVE:   SUBJECTIVE STATEMENT: Pt reports no  pain today, if anything a 1/10 when walking. Continues to present with antalgic-like gait with limp. Reports she is going CT scan today. MRI suspicious of stress fracture.    MRI completed 10/23/23 IMPRESSION: Significant edema in the right femoral head with suggestion of a possible subchondral microfracture in the superior femoral head. Correlation with CT may be beneficial. There is no evidence of an acetabular abnormality. No labral tear as questioned.   MD visit: 10/01/23 - Plan: Impression is chronic right hip pain with associated limp which has had significant improvement in pain but still having a limp/gait abnormality.  She did have intra-articular hip injection about 6 weeks ago and only has very limited pain about the hip with walking and mild pain/weakness within the hip/groin when first waking.  Her symptoms do suggest she may have a labral abnormality about the hip, but I am unsure why she still has a functional gait abnormality.  She has no leg length discrepancy.  She does have a mild thoracolumbar scoliosis but this is not advanced enough to cause a significant gait abnormality and this has only been present since her hip pain began.  She otherwise neurologically is intact and has full muscle strength of her left leg and upper extremities. Given this, I would like to move forward with an MRI arthrogram of the right hip to evaluate the labrum and other intra-articular pathology.  She may use ibuprofen  as needed.  She will continue her  PT and her home exercises.   If for some reason the MRI/MRI of the hip does not show significant pathology that would explain the above, we discussed other workup considerations including EMG/nerve conduction study of the lower extremities to rule out any muscle related abnormality causing her weakness/gait change.  Eval: Woke up one day and her right hip was hurting and has progressively got worse over time.  She is limping a significant amount on arrival;  arrives with her daughter.  Went to ED first; referred to Dr. Onesimo.  He did x-rays and gave prednisone  and that helped a little but then came right back; then referred to Dr. Burnetta.    PERTINENT HISTORY:  PAIN:  Are you having pain? Yes: NPRS scale: 9/10 with walking; 3-4 when sitting Pain location: right side groin and right leg down to knee Pain description: tight,  Aggravating factors: standing, walking Relieving factors: sitting down  PRECAUTIONS: None  RWEIGHT BEARING RESTRICTIONS: No  FALLS:  Has patient fallen in last 6 months? No  OCCUPATION: works at [employer]  PLOF: Independent  PATIENT GOALS: ready to walk normal  NEXT MD VISIT: next week sees Dr. Burnetta  OBJECTIVE:  Note: Objective measures were completed at Evaluation unless otherwise noted.  DIAGNOSTIC FINDINGS:  X-rays of the right hip were obtained in clinic today.  No acute injuries are noted.  No degenerative changes.  No evidence of AVN.  No bony lesions.   Impression: Negative right hip x-rays  Complete lumbar film x-rays including AP, lateral, flexion/extension views  were ordered and reviewed by myself today.  X-rays demonstrate a mild  thoracolumbar levoscoliosis.  There are 5 nonrib-bearing lumbar vertebra  with preserved intervertebral disc spacing.  No notable DDD or acute bony  abnormality noted.    PATIENT SURVEYS:  LEFS  Extreme difficulty/unable (0), Quite a bit of difficulty (1), Moderate difficulty (2), Little difficulty (3), No difficulty (4) Survey date:    Score total:  12/80; 15%    10/29/23: Lower Extremity Functional Score: 49 / 80 = 61.3 %    COGNITION: Overall cognitive status: Within functional limits for tasks assessed     SENSATION: WFL  EDEMA:  None noted   POSTURE: increased lumbar lordosis and weight shift left  PALPATION: Tender right greater troch; glute; piriformis  LOWER EXTREMITY ROM:  Active ROM Right 09/24/23 Left 09/24/23 Right 10/09/23 R 10/29/23   Hip flexion 95 105    Hip extension      Hip abduction      Hip adduction      Hip internal rotation 35 - end range painful  WFL Pull front of thigh  50  Hip external rotation *Restricted secondary to pain - 10 degrees WFL 33 51  Knee flexion   138 WFL  Knee extension      Ankle dorsiflexion      Ankle plantarflexion      Ankle inversion      Ankle eversion       (Blank rows = not tested)  LOWER EXTREMITY MMT:* mild pain  MMT Right eval Left eval Right 10/09/23 R 10/29/23  Hip flexion 2 4+ 4+* 4+ *  Hip extension      Hip abduction      Hip adduction      Hip internal rotation      Hip external rotation      Knee flexion      Knee extension 4+ 5 5 5   Ankle dorsiflexion 4+ 5 5  5  Ankle plantarflexion      Ankle inversion      Ankle eversion         (Blank rows = not tested) LUMBAR ROM: *pain  Active  AROM  eval AROM  10/09/23  Flexion Fingertips* to mid shin painful right anterior hip Fingertips to anterior ankle No pain  Extension 70% available no pain Wfl no pain  Right lateral flexion Fingertips to knee joint line   Left lateral flexion Fingertips to knee joint line   Right rotation    Left rotation     (Blank rows = not tested)   FUNCTIONAL TESTS:  5 times sit to stand: 23.84 using hands to assist up; leaning to the left Timed up and go (TUG): 22.21 sec heavy limp  10/29/23: TUG: 9 seconds, no UE support 5 Times Sit to Stand: 12 second, no UE support, equal WB between LE   GAIT: Distance walked: 50 ft in clinic Assistive device utilized: None Level of assistance: SBA Comments: heavy limp; improved with trial of using SPC                                                                                                                               TREATMENT DATE:  10/29/23:  Re-eval/PN: LEFS LE ROM LE MMT 5 times sit to stand TUG Review of goals Gait trial: verbal cues to limit limp, glute set, heel strike, foot rocker, walking in tube, + arm  swing, 226 ft x2 Recumbent bike, level 3. 5 min, seat 5    10/22/23:  Standing in // bars, bil UE assist Lateral step downs, 6 inch step, 2x10 bil  Lateral toes taps, 2x10 bil, 4 inch step  Forward lunges onto 6 step, no UE,  2X10 each  3-way Towel slides 10X each LE with 1 UE assist Walking lunges, 14ft line, 2RT Monster walks, GTB at knees 38ft line, 2RT Lateral stepping, GTB at knees, 9ft line 2RT  10/17/23: Squats from chair height, 2x10 Forward steps, 4 inch step, 10x with UE support  10x without UE support Step overs, 4 inch step, 10x ascending with LLE and descending with RLE  2 inch step, 10x with RLE ascending and LLE descending Side stepping, 20 ftx2, with red TB  + mini squat, 20 ftx2 Obstacle navigation, three 6 inch obstacles in // bars: Forward steps 2x down and back  Side steps, 2x down and back  Nustep seat 6 x 5' level 4 dynamic warm up    PATIENT EDUCATION:  Education details: Patient educated on exam findings, POC, scope of PT, HEP, and what to expect next visit. Person educated: Patient Education method: Explanation, Demonstration, and Handouts Education comprehension: verbalized understanding, returned demonstration, verbal cues required, and tactile cues required HOME EXERCISE PROGRAM: Access Code: [facility] URL: [email or web address] Date: 09/18/2023 Prepared by: [facility] - Rehab  Exercises - Standing Lumbar Extension with Counter  - 8 x daily - 7 x weekly -  1 sets - 10 reps - Standing Lumbar Extension  - 8 x daily - 7 x weekly - 1 sets - 10 reps  Access Code: [email or web address] URL: [email or web address] Date: 09/20/2023 Prepared by: Lang Ada  Exercises - Supine Heel Slide with Strap  - 1 x daily - 7 x weekly - 3 sets - 10 reps - Supine Bridge  - 1 x daily - 7 x weekly - 3 sets - 10 reps - 3 hold - Supine Lower Trunk Rotation  - 1 x daily - 7 x weekly - 3 sets - 10 reps - Supine Posterior Pelvic Tilt  - 1 x daily - 7 x weekly  - 3 sets - 10 reps - Hooklying Isometric Hip Abduction with Belt  - 1 x daily - 7 x weekly - 3 sets - 10 reps - 5 hold  Exercises - Side Stepping with Resistance at Thighs  - 2 x daily - 7 x weekly - 3 sets - 10 reps   ASSESSMENT:  CLINICAL IMPRESSION: Progress Note/Re-evaluation performed this date. Patient demonstrates progress with self perceived function, LE strength, hip ROM, and functional tests indicating decreased fall risk and improved LE strength and power. Patient has met all goals but continues to demonstrate slight pain and altered gait with limp. Patient is scheduled for further imaging of hip this date and would benefit from continued stilled therapy services to address remaining unmet and partially met goals.     Eval: Patient is a 28 y.o. female who was seen today for physical therapy evaluation and treatment for M25.551,G89.29 (ICD-10-CM) - Chronic right hip painM25.551,G89.29 (ICD-10-CM) - Chronic right hip pain. . Patient demonstrates muscle weakness, reduced ROM, and fascial restrictions which are likely contributing to symptoms of pain and are negatively impacting patient ability to perform ADLs and functional mobility tasks. Patient will benefit from skilled physical therapy services to address these deficits to reduce pain and improve level of function with ADLs and functional mobility tasks.   OBJECTIVE IMPAIRMENTS: Abnormal gait, decreased activity tolerance, decreased balance, decreased knowledge of condition, decreased knowledge of use of DME, decreased mobility, difficulty walking, decreased ROM, decreased strength, increased fascial restrictions, impaired perceived functional ability, postural dysfunction, and pain.   ACTIVITY LIMITATIONS: carrying, lifting, bending, standing, sleeping, stairs, transfers, bed mobility, and locomotion level  PARTICIPATION LIMITATIONS: meal prep, cleaning, laundry, shopping, community activity, and occupation  REHAB POTENTIAL:  Good  CLINICAL DECISION MAKING: Evolving/moderate complexity  EVALUATION COMPLEXITY: Moderate   GOALS: Goals reviewed with patient? Yes  SHORT TERM GOALS: Target date: 10/02/2023 patient will be independent with initial HEP  Baseline: Everyday compliance Goal status: met  2.  Patient will report 50% improvement overall  Baseline:  Goal status: met  LONG TERM GOALS: Target date: 11/06/2023   Patient will be independent in self management strategies to improve quality of life and functional outcomes.  Baseline:  Goal status: MET  2.  Patient will report 80% improvement overall  Baseline: reports 85% on 10/29/23 Goal status: MET  3.  Patient will improve LEFS score by 30 points to demonstrate improved perceived function  Baseline: 12/80; 32/80 10/09/23, 49/80 on 10/29/23 Goal status: MET  4.  Patient will improve TUG score to 10 sec or less to demonstrate improved functional mobility and decreased fall risk  Baseline: 22.21; 11.68 sec, 9 sec on 10/29/23 Goal status: MET  5.  Patient will improve 5 times sit to stand score to 15 sec or less to demonstrate improved functional mobility and increased  leg strength.    Baseline: 23.84 sec; 13.99 sec, 12 sec on 10/29/23 Goal status:  met    PLAN:  PT FREQUENCY: 2x/week  PT DURATION: 4 weeks  PLANNED INTERVENTIONS: 97164- PT Re-evaluation, 97110-Therapeutic exercises, 97530- Therapeutic activity, 97112- Neuromuscular re-education, 97535- Self Care, 02859- Manual therapy, [phone]- Gait training, [phone]- Orthotic Fit/training, [phone]- Canalith repositioning, [id number]- Aquatic Therapy, [phone]- Splinting, [phone]- Wound care (first 20 sq cm), 97598- Wound care (each additional 20 sq cm)Patient/Family education, Balance training, Stair training, Taping, Dry Needling, Joint mobilization, Joint manipulation, Spinal manipulation, Spinal mobilization, Scar mobilization, and DME instructions.   PLAN FOR NEXT SESSION: continue to progress LE  strength and stability as well as core.  Update HEP as needed.     12:41 PM, 10/29/23 Rosaria Settler, PT, DPT [facility] [place]    Managed Medicaid Authorization Request Treatment Start Date: October 07, 2023  Visit Dx Codes: M25.551, R26.2  Functional Tool Score: Lower Extremity Functional Score: 49 / 80 = 61.3 %  For all possible CPT codes, reference the Planned Interventions line above.     Check all conditions that are expected to impact treatment: {Conditions expected to impact treatment:None of these apply   If treatment provided at initial evaluation, no treatment charged due to lack of authorization.

## 2023-10-31 ENCOUNTER — Encounter (HOSPITAL_COMMUNITY): Payer: Self-pay

## 2023-10-31 ENCOUNTER — Ambulatory Visit (HOSPITAL_COMMUNITY)

## 2023-10-31 DIAGNOSIS — R262 Difficulty in walking, not elsewhere classified: Secondary | ICD-10-CM

## 2023-10-31 DIAGNOSIS — M25551 Pain in right hip: Secondary | ICD-10-CM

## 2023-10-31 NOTE — Therapy (Signed)
 OUTPATIENT PHYSICAL THERAPY LOWER EXTREMITY TREATMENT  Patient Name: Jacqueline Koch MRN: [id number] DOB:[birth year], 28 y.o., female Today's Date: 10/31/2023  END OF SESSION:  PT End of Session - 10/31/23 0947     Visit Number 14    Number of Visits 20    Date for Recertification  11/21/23    Authorization Type [place] Medicaid Prepaid    Authorization Time Period healthy blue approved 6 visits from 10/11/2023-12/09/2023, new auth requested 9/23    PT Start Time 0947            Past Medical History:  Diagnosis Date   Kidney stone    Nexplanon  removal 06/09/2014   Syncope    History reviewed. No pertinent surgical history. Patient Active Problem List   Diagnosis Date Noted   Closed nondisplaced fracture of shaft of left clavicle 03/30/2020   Nondisplaced fracture of body of left scapula with routine healing 03/30/2020   Nexplanon  insertion 08/22/2015   Susceptible to varicella (non-immune), currently pregnant 12/13/2014   Marijuana use 12/08/2014   Chlamydia infection 01/20/2013   Adolescent scoliosis 12/01/2012   Abnormal vision screen 12/01/2012   Hearing deficit 12/01/2012   Scoliosis 11/02/2010   Back pain 11/02/2010    PCP: none  REFERRING PROVIDER: Burnetta Brunet, DO REFERRING DIAG: [phone] (ICD-10-CM) - Chronic right hip pain  THERAPY DIAG:  Pain in right hip  Difficulty in walking, not elsewhere classified  Rationale for Evaluation and Treatment: Rehabilitation  ONSET DATE: 4 months  SUBJECTIVE:   SUBJECTIVE STATEMENT: Reports she slept on Rt side last night, work up with some dull achy pain Rt hip.  Had cat scan yesterday.  MRI completed 10/23/23 IMPRESSION: Significant edema in the right femoral head with suggestion of a possible subchondral microfracture in the superior femoral head. Correlation with CT may be beneficial. There is no evidence of an acetabular abnormality. No labral tear as questioned.   MD visit: 10/01/23 - Plan: Impression  is chronic right hip pain with associated limp which has had significant improvement in pain but still having a limp/gait abnormality.  She did have intra-articular hip injection about 6 weeks ago and only has very limited pain about the hip with walking and mild pain/weakness within the hip/groin when first waking.  Her symptoms do suggest she may have a labral abnormality about the hip, but I am unsure why she still has a functional gait abnormality.  She has no leg length discrepancy.  She does have a mild thoracolumbar scoliosis but this is not advanced enough to cause a significant gait abnormality and this has only been present since her hip pain began.  She otherwise neurologically is intact and has full muscle strength of her left leg and upper extremities. Given this, I would like to move forward with an MRI arthrogram of the right hip to evaluate the labrum and other intra-articular pathology.  She may use ibuprofen  as needed.  She will continue her PT and her home exercises.   If for some reason the MRI/MRI of the hip does not show significant pathology that would explain the above, we discussed other workup considerations including EMG/nerve conduction study of the lower extremities to rule out any muscle related abnormality causing her weakness/gait change.  Eval: Woke up one day and her right hip was hurting and has progressively got worse over time.  She is limping a significant amount on arrival; arrives with her daughter.  Went to ED first; referred to Dr. Onesimo.  He did x-rays  and gave prednisone  and that helped a little but then came right back; then referred to Dr. Burnetta.    PERTINENT HISTORY:  PAIN:  Are you having pain? Yes: NPRS scale: 9/10 with walking; 3-4 when sitting Pain location: right side groin and right leg down to knee Pain description: tight,  Aggravating factors: standing, walking Relieving factors: sitting down  PRECAUTIONS: None  RWEIGHT BEARING RESTRICTIONS:  No  FALLS:  Has patient fallen in last 6 months? No  OCCUPATION: works at [employer]  PLOF: Independent  PATIENT GOALS: ready to walk normal  NEXT MD VISIT: next week sees Dr. Burnetta  OBJECTIVE:  Note: Objective measures were completed at Evaluation unless otherwise noted.  DIAGNOSTIC FINDINGS:  X-rays of the right hip were obtained in clinic today.  No acute injuries are noted.  No degenerative changes.  No evidence of AVN.  No bony lesions.   Impression: Negative right hip x-rays  Complete lumbar film x-rays including AP, lateral, flexion/extension views  were ordered and reviewed by myself today.  X-rays demonstrate a mild  thoracolumbar levoscoliosis.  There are 5 nonrib-bearing lumbar vertebra  with preserved intervertebral disc spacing.  No notable DDD or acute bony  abnormality noted.    PATIENT SURVEYS:  LEFS  Extreme difficulty/unable (0), Quite a bit of difficulty (1), Moderate difficulty (2), Little difficulty (3), No difficulty (4) Survey date:    Score total:  12/80; 15%    10/29/23: Lower Extremity Functional Score: 49 / 80 = 61.3 %    COGNITION: Overall cognitive status: Within functional limits for tasks assessed     SENSATION: WFL  EDEMA:  None noted   POSTURE: increased lumbar lordosis and weight shift left  PALPATION: Tender right greater troch; glute; piriformis  LOWER EXTREMITY ROM:  Active ROM Right 09/24/23 Left 09/24/23 Right 10/09/23 R 10/29/23  Hip flexion 95 105    Hip extension      Hip abduction      Hip adduction      Hip internal rotation 35 - end range painful  WFL Pull front of thigh  50  Hip external rotation *Restricted secondary to pain - 10 degrees WFL 33 51  Knee flexion   138 WFL  Knee extension      Ankle dorsiflexion      Ankle plantarflexion      Ankle inversion      Ankle eversion       (Blank rows = not tested)  LOWER EXTREMITY MMT:* mild pain  MMT Right eval Left eval Right 10/09/23 R 10/29/23  Hip flexion  2 4+ 4+* 4+ *  Hip extension      Hip abduction      Hip adduction      Hip internal rotation      Hip external rotation      Knee flexion      Knee extension 4+ 5 5 5   Ankle dorsiflexion 4+ 5 5 5   Ankle plantarflexion      Ankle inversion      Ankle eversion         (Blank rows = not tested) LUMBAR ROM: *pain  Active  AROM  eval AROM  10/09/23  Flexion Fingertips* to mid shin painful right anterior hip Fingertips to anterior ankle No pain  Extension 70% available no pain Wfl no pain  Right lateral flexion Fingertips to knee joint line   Left lateral flexion Fingertips to knee joint line   Right rotation    Left  rotation     (Blank rows = not tested)   FUNCTIONAL TESTS:  5 times sit to stand: 23.84 using hands to assist up; leaning to the left Timed up and go (TUG): 22.21 sec heavy limp  10/29/23: TUG: 9 seconds, no UE support 5 Times Sit to Stand: 12 second, no UE support, equal WB between LE   GAIT: Distance walked: 50 ft in clinic Assistive device utilized: None Level of assistance: SBA Comments: heavy limp; improved with trial of using SPC                                                                                                                               TREATMENT DATE:  10/31/23: Treadmill x Grade 2 1.2-->1.5 Standing at // bars Toe tapping 6in step 3x 10  Lateral toe tapping between 6 and 8in step height alternating 2x 10 Hurdles 6-> 12in forward and lateral step over no HHA 3RT each direction Vector stance 3x 5 Squat front of mat with cueing for mechanics 2x 10 Leg press 4Pl 2x 10 Lateral lunges on 6in step 10x  10/29/23:  Re-eval/PN: LEFS LE ROM LE MMT 5 times sit to stand TUG Review of goals Gait trial: verbal cues to limit limp, glute set, heel strike, foot rocker, walking in tube, + arm swing, 226 ft x2 Recumbent bike, level 3. 5 min, seat 5    10/22/23:  Standing in // bars, bil UE assist Lateral step downs, 6 inch step,  2x10 bil  Lateral toes taps, 2x10 bil, 4 inch step  Forward lunges onto 6 step, no UE,  2X10 each  3-way Towel slides 10X each LE with 1 UE assist Walking lunges, 28ft line, 2RT Monster walks, GTB at knees 88ft line, 2RT Lateral stepping, GTB at knees, 68ft line 2RT     PATIENT EDUCATION:  Education details: Patient educated on exam findings, POC, scope of PT, HEP, and what to expect next visit. Person educated: Patient Education method: Explanation, Demonstration, and Handouts Education comprehension: verbalized understanding, returned demonstration, verbal cues required, and tactile cues required HOME EXERCISE PROGRAM: Access Code: [facility] URL: [email or web address] Date: 09/18/2023 Prepared by: AP - Rehab  Exercises - Standing Lumbar Extension with Counter  - 8 x daily - 7 x weekly - 1 sets - 10 reps - Standing Lumbar Extension  - 8 x daily - 7 x weekly - 1 sets - 10 reps  Access Code: [email or web address] URL: [email or web address] Date: 09/20/2023 Prepared by: Lang Ada  Exercises - Supine Heel Slide with Strap  - 1 x daily - 7 x weekly - 3 sets - 10 reps - Supine Bridge  - 1 x daily - 7 x weekly - 3 sets - 10 reps - 3 hold - Supine Lower Trunk Rotation  - 1 x daily - 7 x weekly - 3 sets - 10 reps - Supine Posterior Pelvic Tilt  - 1 x daily - 7 x weekly -  3 sets - 10 reps - Hooklying Isometric Hip Abduction with Belt  - 1 x daily - 7 x weekly - 3 sets - 10 reps - 5 hold  Exercises - Side Stepping with Resistance at Thighs  - 2 x daily - 7 x weekly - 3 sets - 10 reps   ASSESSMENT:  CLINICAL IMPRESSION: Began session on treadmill to address antalgic gait mechanics.  Added LE strengthening exercises and continued with SLS activities.  Pt tolerated well to all exercises with no reports of pain.  Did require some cueing with new exercises for form and mechanics.  Noted visible fatigue Rt LE.  Pt has apt scheduled on 11/11/23 to review findings with cat  scam completed yesterday.    Eval: Patient is a 28 y.o. female who was seen today for physical therapy evaluation and treatment for M25.551,G89.29 (ICD-10-CM) - Chronic right hip painM25.551,G89.29 (ICD-10-CM) - Chronic right hip pain. . Patient demonstrates muscle weakness, reduced ROM, and fascial restrictions which are likely contributing to symptoms of pain and are negatively impacting patient ability to perform ADLs and functional mobility tasks. Patient will benefit from skilled physical therapy services to address these deficits to reduce pain and improve level of function with ADLs and functional mobility tasks.   OBJECTIVE IMPAIRMENTS: Abnormal gait, decreased activity tolerance, decreased balance, decreased knowledge of condition, decreased knowledge of use of DME, decreased mobility, difficulty walking, decreased ROM, decreased strength, increased fascial restrictions, impaired perceived functional ability, postural dysfunction, and pain.   ACTIVITY LIMITATIONS: carrying, lifting, bending, standing, sleeping, stairs, transfers, bed mobility, and locomotion level  PARTICIPATION LIMITATIONS: meal prep, cleaning, laundry, shopping, community activity, and occupation  REHAB POTENTIAL: Good  CLINICAL DECISION MAKING: Evolving/moderate complexity  EVALUATION COMPLEXITY: Moderate   GOALS: Goals reviewed with patient? Yes  SHORT TERM GOALS: Target date: 10/02/2023 patient will be independent with initial HEP  Baseline: Everyday compliance Goal status: met  2.  Patient will report 50% improvement overall  Baseline:  Goal status: met  LONG TERM GOALS: Target date: 11/06/2023   Patient will be independent in self management strategies to improve quality of life and functional outcomes.  Baseline:  Goal status: MET  2.  Patient will report 80% improvement overall  Baseline: reports 85% on 10/29/23 Goal status: MET  3.  Patient will improve LEFS score by 30 points to  demonstrate improved perceived function  Baseline: 12/80; 32/80 10/09/23, 49/80 on 10/29/23 Goal status: MET  4.  Patient will improve TUG score to 10 sec or less to demonstrate improved functional mobility and decreased fall risk  Baseline: 22.21; 11.68 sec, 9 sec on 10/29/23 Goal status: MET  5.  Patient will improve 5 times sit to stand score to 15 sec or less to demonstrate improved functional mobility and increased leg strength.    Baseline: 23.84 sec; 13.99 sec, 12 sec on 10/29/23 Goal status:  met    PLAN:  PT FREQUENCY: 2x/week  PT DURATION: 4 weeks  PLANNED INTERVENTIONS: 97164- PT Re-evaluation, 97110-Therapeutic exercises, 97530- Therapeutic activity, 97112- Neuromuscular re-education, 97535- Self Care, 02859- Manual therapy, [phone]- Gait training, [phone]- Orthotic Fit/training, [phone]- Canalith repositioning, [id number]- Aquatic Therapy, [phone]- Splinting, [phone]- Wound care (first 20 sq cm), 97598- Wound care (each additional 20 sq cm)Patient/Family education, Balance training, Stair training, Taping, Dry Needling, Joint mobilization, Joint manipulation, Spinal manipulation, Spinal mobilization, Scar mobilization, and DME instructions.   PLAN FOR NEXT SESSION: continue to progress LE strength and stability as well as core.  Update HEP as needed.  Augustin Mclean, LPTA/CLT; CBIS [phone]  9:47 AM, 10/31/23

## 2023-11-01 ENCOUNTER — Encounter (HOSPITAL_COMMUNITY): Payer: Self-pay

## 2023-11-01 ENCOUNTER — Inpatient Hospital Stay (HOSPITAL_COMMUNITY)
Admission: EM | Admit: 2023-11-01 | Discharge: 2023-11-04 | DRG: 522 | Disposition: A | Discharge status: Home / Self Care | Attending: Internal Medicine | Admitting: Internal Medicine

## 2023-11-01 ENCOUNTER — Emergency Department (HOSPITAL_COMMUNITY)

## 2023-11-01 ENCOUNTER — Other Ambulatory Visit: Payer: Self-pay

## 2023-11-01 DIAGNOSIS — R55 Syncope and collapse: Secondary | ICD-10-CM | POA: Diagnosis present

## 2023-11-01 DIAGNOSIS — W010XXA Fall on same level from slipping, tripping and stumbling without subsequent striking against object, initial encounter: Secondary | ICD-10-CM | POA: Diagnosis present

## 2023-11-01 DIAGNOSIS — Z825 Family history of asthma and other chronic lower respiratory diseases: Secondary | ICD-10-CM

## 2023-11-01 DIAGNOSIS — F17210 Nicotine dependence, cigarettes, uncomplicated: Secondary | ICD-10-CM | POA: Diagnosis present

## 2023-11-01 DIAGNOSIS — Z96641 Presence of right artificial hip joint: Secondary | ICD-10-CM | POA: Diagnosis not present

## 2023-11-01 DIAGNOSIS — Z59819 Housing instability, housed unspecified: Secondary | ICD-10-CM

## 2023-11-01 DIAGNOSIS — Z880 Allergy status to penicillin: Secondary | ICD-10-CM | POA: Diagnosis not present

## 2023-11-01 DIAGNOSIS — S72001A Fracture of unspecified part of neck of right femur, initial encounter for closed fracture: Secondary | ICD-10-CM | POA: Diagnosis not present

## 2023-11-01 DIAGNOSIS — G8929 Other chronic pain: Secondary | ICD-10-CM | POA: Diagnosis present

## 2023-11-01 DIAGNOSIS — M129 Arthropathy, unspecified: Secondary | ICD-10-CM | POA: Diagnosis present

## 2023-11-01 DIAGNOSIS — D62 Acute posthemorrhagic anemia: Secondary | ICD-10-CM | POA: Insufficient documentation

## 2023-11-01 DIAGNOSIS — S72011A Unspecified intracapsular fracture of right femur, initial encounter for closed fracture: Secondary | ICD-10-CM | POA: Diagnosis present

## 2023-11-01 DIAGNOSIS — F32A Depression, unspecified: Secondary | ICD-10-CM | POA: Diagnosis present

## 2023-11-01 DIAGNOSIS — Z87442 Personal history of urinary calculi: Secondary | ICD-10-CM | POA: Diagnosis not present

## 2023-11-01 LAB — CBC
HCT: 34.2 % — ABNORMAL LOW (ref 36.0–46.0)
Hemoglobin: 11.4 g/dL — ABNORMAL LOW (ref 12.0–15.0)
MCH: 31.5 pg (ref 26.0–34.0)
MCHC: 33.3 g/dL (ref 30.0–36.0)
MCV: 94.5 fL (ref 80.0–100.0)
Platelets: 196 K/uL (ref 150–400)
RBC: 3.62 MIL/uL — ABNORMAL LOW (ref 3.87–5.11)
RDW: 12.3 % (ref 11.5–15.5)
WBC: 8.5 K/uL (ref 4.0–10.5)
nRBC: 0 % (ref 0.0–0.2)

## 2023-11-01 LAB — BASIC METABOLIC PANEL WITH GFR
Anion gap: 13 (ref 5–15)
BUN: 11 mg/dL (ref 6–20)
CO2: 20 mmol/L — ABNORMAL LOW (ref 22–32)
Calcium: 8.9 mg/dL (ref 8.9–10.3)
Chloride: 105 mmol/L (ref 98–111)
Creatinine, Ser: 0.48 mg/dL (ref 0.44–1.00)
GFR, Estimated: >60 mL/min (ref >60)
Glucose, Bld: 93 mg/dL (ref 70–99)
Potassium: 3.9 mmol/L (ref 3.5–5.1)
Sodium: 138 mmol/L (ref 135–145)

## 2023-11-01 LAB — HCG, SERUM, QUALITATIVE: Preg, Serum: NEGATIVE

## 2023-11-01 LAB — VITAMIN D 25 HYDROXY (VIT D DEFICIENCY, FRACTURES): Vit D, 25-Hydroxy: 36.89 ng/mL (ref 30–100)

## 2023-11-01 MED ORDER — POLYETHYLENE GLYCOL 3350 17 G PO PACK: 17.0000 g | PACK | Freq: Every day | ORAL | Status: DC | PRN

## 2023-11-01 MED ORDER — HYDROMORPHONE HCL 1 MG/ML IJ SOLN
0.5000 mg | Freq: Once | INTRAMUSCULAR | Status: AC
  Administered 2023-11-01: 0.5 mg via INTRAVENOUS
  Filled 2023-11-01: qty 0.5

## 2023-11-01 MED ORDER — HYDROMORPHONE HCL 1 MG/ML IJ SOLN
0.5000 mg | INTRAMUSCULAR | Status: DC | PRN
  Administered 2023-11-01 – 2023-11-02 (×5): 0.5 mg via INTRAVENOUS
  Filled 2023-11-01 (×5): qty 0.5

## 2023-11-01 MED ORDER — KETOROLAC TROMETHAMINE 15 MG/ML IJ SOLN
15.0000 mg | Freq: Once | INTRAMUSCULAR | Status: AC
  Administered 2023-11-01: 15 mg via INTRAVENOUS
  Filled 2023-11-01: qty 1

## 2023-11-01 MED ORDER — METHOCARBAMOL 500 MG PO TABS
500.0000 mg | ORAL_TABLET | Freq: Four times a day (QID) | ORAL | Status: DC | PRN
  Administered 2023-11-01 – 2023-11-03 (×4): 500 mg via ORAL
  Filled 2023-11-01 (×4): qty 1

## 2023-11-01 MED ORDER — ENOXAPARIN SODIUM 40 MG/0.4ML IJ SOSY: 40.0000 mg | PREFILLED_SYRINGE | INTRAMUSCULAR | Status: DC

## 2023-11-01 MED ORDER — OXYCODONE HCL 5 MG PO TABS
5.0000 mg | ORAL_TABLET | ORAL | Status: DC | PRN
  Administered 2023-11-02 (×3): 10 mg via ORAL
  Administered 2023-11-02: 5 mg via ORAL
  Filled 2023-11-01 (×5): qty 2

## 2023-11-01 MED ORDER — SENNA 8.6 MG PO TABS
1.0000 | ORAL_TABLET | Freq: Two times a day (BID) | ORAL | Status: DC
  Administered 2023-11-01 – 2023-11-04 (×4): 8.6 mg via ORAL
  Filled 2023-11-01 (×4): qty 1

## 2023-11-01 MED ORDER — METHOCARBAMOL 1000 MG/10ML IJ SOLN
500.0000 mg | Freq: Four times a day (QID) | INTRAMUSCULAR | Status: DC | PRN
  Administered 2023-11-02: 500 mg via INTRAVENOUS
  Filled 2023-11-01: qty 10

## 2023-11-01 NOTE — ED Notes (Addendum)
 Report was not given to Avelina Senters RN due to the nurse giving the 'green man' icon which means they do not need verbal report

## 2023-11-01 NOTE — ED Notes (Signed)
 [facility] has arrived at this time to pick up pt

## 2023-11-01 NOTE — H&P (Signed)
 [facility] H&P   Patient Demographics:    Jacqueline Koch, is a 28 y.o. female  MRN: [id number]   DOB - 12/17/1995  Admit Date - 11/01/2023  Outpatient Primary MD for the patient is Pcp, No  Referring MD/NP/PA: PA Erin  Patient coming from: home  Chief Complaint  Patient presents with   Fall      HPI:    Jacqueline Koch  is a 28 y.o. female, without significant past medical history, complains of chronic right hip pain, she is currently followed by sports medicine doctor at [facility] Dr. Lyle Brunet, ongoing pain since May, much improved after receiving greater trochanteric injection followed by a subsequent intra-articular hip injection on 7/1 and 7/15.  Have MRI of right hip 10/23/2023 was noted for significant edema in the right femoral head with suggestion of possible subchondral microfracture in the superior femoral head, (patient did not have much hip pain by then). - Patient presents to ED secondary to fall, and sudden onset of right hip pain, patient reports she was in [employer], where she slipped and a bottle of water, causing her to fall, she reports (like I did a split), she had significant right hip/inner thigh pain which prompted her to come to ED. -CT right hip was obtained, which was significant for right intertrochanteric fracture, ED discussed with local orthopedic Dr. Kurt who recommended referral to [facility], ED discussed with tabetic PA Ozell Purchase who recommended admission to [facility] and patient will be seen by Ortho care group regarding further surgical intervention.    Review of systems:      A full 10 point Review of Systems was done, except as stated above, all other Review of Systems were negative.   With Past History of the following :    Past Medical History:  Diagnosis Date   Kidney stone    Nexplanon  removal 06/09/2014   Syncope        History reviewed. No pertinent surgical history.    Social History:     Social History   Tobacco Use   Smoking status: Light Smoker    Types: Cigarettes, Cigars   Smokeless tobacco: Never   Tobacco comments:    1 cigarette daily   Substance Use Topics   Alcohol use: Yes    Alcohol/week: 0.0 standard drinks of alcohol    Comment: social        Family History :     Family History  Problem Relation Age of Onset   Asthma Brother    Arthritis Paternal Grandmother    Heart Problems Paternal Grandmother    Crohn's disease Maternal Grandmother       Home Medications:   Prior to Admission medications   Medication Sig Start Date End Date Taking? Authorizing Provider  etonogestrel  (NEXPLANON ) 68 MG IMPL implant 1 each by Subdermal route once.   Yes [provider]  ibuprofen  (ADVIL ) 200 MG tablet Take 400 mg by mouth every 6 (six) hours as needed for headache.   Yes [provider]     Allergies:     Allergies  Allergen Reactions   Penicillins Other (See Comments)    Pt states she is not sure, she has been allergic since she was younger      Physical Exam:   Vitals  Blood pressure 106/71, pulse 92, temperature 97.7 F (36.5 C), temperature source Oral, resp. rate 18, height 4' 11 (1.499 m), weight 65.8 kg, SpO2 98%.   General Well-developed female, laying in bed, in no apparent distress  Normal affect and insight, Not Suicidal or Homicidal, Awake Alert, Oriented X 3.  No F.N deficits, ALL C.Nerves Intact,   Ears and Eyes appear Normal, Conjunctivae clear, PERRLA. Moist Oral Mucosa.  Symmetrical Chest wall movement, Good air movement bilaterally your yearly  RRR,  Positive Bowel Sounds, Abdomen Soft  No Cyanosis, Normal Skin Turgor, No Skin Rash or Bruise.  Unable to assess range of motion in right lower extremity due to pain    Data Review:    CBC Recent Labs  Lab 11/01/23 1526  WBC 8.5  HGB 11.4*  HCT 34.2*  PLT 196   MCV 94.5  MCH 31.5  MCHC 33.3  RDW 12.3   ------------------------------------------------------------------------------------------------------------------  Chemistries  Recent Labs  Lab 11/01/23 1526  NA 138  K 3.9  CL 105  CO2 20*  GLUCOSE 93  BUN 11  CREATININE 0.48  CALCIUM 8.9   ------------------------------------------------------------------------------------------------------------------ estimated creatinine clearance is 86.3 mL/min (by C-G formula based on SCr of 0.48 mg/dL). ------------------------------------------------------------------------------------------------------------------ No results for input(s): TSH, T4TOTAL, T3FREE, THYROIDAB in the last 72 hours.  Invalid input(s): FREET3  Coagulation profile No results for input(s): INR, PROTIME in the last 168 hours. ------------------------------------------------------------------------------------------------------------------- No results for input(s): DDIMER in the last 72 hours. -------------------------------------------------------------------------------------------------------------------  Cardiac Enzymes No results for input(s): CKMB, TROPONINI, MYOGLOBIN in the last 168 hours.  Invalid input(s): CK ------------------------------------------------------------------------------------------------------------------ No results found for: BNP   ---------------------------------------------------------------------------------------------------------------  Urinalysis    Component Value Date/Time   COLORURINE YELLOW 10/24/2020 0801   APPEARANCEUR CLOUDY (A) 10/24/2020 0801   APPEARANCEUR Clear 04/14/2020 1137   LABSPEC 1.018 10/24/2020 0801   PHURINE 5.0 10/24/2020 0801   GLUCOSEU NEGATIVE 10/24/2020 0801   HGBUR SMALL (A) 10/24/2020 0801   BILIRUBINUR negative 06/13/2023 1238   BILIRUBINUR Negative 04/14/2020 1137   KETONESUR negative 06/13/2023 1238   KETONESUR  80 (A) 10/24/2020 0801   PROTEINUR =30 (A) 06/13/2023 1238   PROTEINUR 100 (A) 10/24/2020 0801   UROBILINOGEN 1.0 06/13/2023 1238   UROBILINOGEN 1.0 08/25/2013 1645   NITRITE Negative 06/13/2023 1238   NITRITE NEGATIVE 10/24/2020 0801   LEUKOCYTESUR Small (1+) (A) 06/13/2023 1238   LEUKOCYTESUR MODERATE (A) 10/24/2020 0801    ----------------------------------------------------------------------------------------------------------------   Imaging Results:    CT Hip Right Wo Contrast Result Date: 11/01/2023 CLINICAL DATA:  Right hip pain status post fall this morning. Right hip deformity with femoral neck fracture. EXAM: CT OF THE RIGHT HIP WITHOUT CONTRAST TECHNIQUE: Multidetector CT imaging of the right hip was performed according to the standard protocol. Multiplanar CT image reconstructions were also generated. RADIATION DOSE REDUCTION: This exam was performed according to the departmental dose-optimization program which includes automated exposure control, adjustment of the mA and/or kV according to patient size and/or use of iterative reconstruction technique. COMPARISON:  Same day radiographs of the hips and right femur. CT  of the right hip 10/29/2023. FINDINGS: Bones/Joint/Cartilage Positioning limited by the patient's injury. The patient's right hip is flexed approximately 90 degrees and the examination was performed in a decubitus position. There is an acute subcapital fracture of the right femoral neck with associated impaction and superolateral displacement. This appears new compared with the recent hip CT. No evidence of dislocation, underlying significant hip arthropathy or femoral head osteonecrosis. There is a small right hip joint effusion. Ligaments Suboptimally assessed by CT. Muscles and Tendons Unremarkable. Soft tissues No evidence of periarticular hematoma or other focal fluid collection. No evidence of foreign body or soft tissue emphysema. The visualized internal pelvic  contents appear unremarkable. IMPRESSION: 1. Acute subcapital fracture of the right femoral neck with associated impaction and superolateral displacement. Associated significant flexion at the hip. 2. No evidence of dislocation or significant hip arthropathy. 3. No evidence of periarticular hematoma or other focal fluid collection. Electronically Signed   By: Elsie Perone M.D.   On: 11/01/2023 14:42   DG Hip Unilat W or Wo Pelvis 2-3 Views Right Result Date: 11/01/2023 CLINICAL DATA:  Status post fall. EXAM: DG HIP (WITH OR WITHOUT PELVIS) 2-3V RIGHT COMPARISON:  None Available. FINDINGS: There is limited patient positioning, secondary to inability of the patient to lower the right leg. A fracture deformity is suspected, extending through the neck and adjacent portion of the head of the proximal right femur with marked severity anterior angulation of the distal fracture site. There is no evidence of dislocation. There is no evidence of arthropathy or other focal bone abnormality. IMPRESSION: Suspected acute fracture of the proximal right femur. CT correlation is recommended. Electronically Signed   By: Suzen Dials M.D.   On: 11/01/2023 13:34   DG Knee 2 Views Right Result Date: 11/01/2023 EXAM: 1 OR 2 VIEW(S) XRAY OF THE RIGHT KNEE 11/01/2023 12:48:00 PM COMPARISON: None available. CLINICAL HISTORY: Fall, history of suspected subchondral microfracture of superior femoral head. Per triage: Patient at [employer] this morning, hit a slick spot and fell inside the store. Endorses no loss of consciousness, complains of pain with right leg/hip. No medical history. No allergies. Best images possible. Patient unable to lower right leg, patient had right knee against her chest during most of the exam. Tech held leg for multiple images. Patient stated she had a stress fracture a few months ago and was still in physical therapy. FINDINGS: BONES AND JOINTS: No acute fracture. No focal osseous lesion. No joint  dislocation. No significant joint effusion. No significant degenerative changes. SOFT TISSUES: The soft tissues are unremarkable. IMPRESSION: 1. Normal knee radiographs. No acute osseous abnormality. Electronically signed by: Waddell Calk MD 11/01/2023 01:25 PM EDT RP Workstation: [id number]   DG Femur Min 2 Views Right Result Date: 11/01/2023 EXAM: 2 VIEW(S) XRAY OF THE RIGHT FEMUR 11/01/2023 12:48:00 PM COMPARISON: None available. CLINICAL HISTORY: Fall, history of suspected subchondral microfracture of superior femoral head. Per triage: Patient at [employer] this am, hit a slick spot and fell inside the store. Endorses no LOC, complains of pain with right leg/hip. No medical history. No allergies. Best images possible. Patient unable to lower right leg, patient had right knee against her chest during most of the exam. Tech held leg for multiple images. Patient stated she had a stress fracture a few months ago and was still in physical therapy. FINDINGS: BONES AND JOINTS: Acute right femoral neck fracture with proximal displacement and external rotation of the distal fracture fragment. No focal osseous lesion. No joint dislocation.  SOFT TISSUES: The soft tissues are unremarkable. IMPRESSION: 1. Acute right femoral neck fracture with proximal displacement and external rotation of the distal fragment. Electronically signed by: Waddell Calk MD 11/01/2023 01:24 PM EDT RP Workstation: [id number]    EKG:  Vent. rate 74 BPM PR interval 140 ms QRS duration 83 ms QT/QTcB 351/390 ms P-R-T axes 30 74 50 Sinus rhythm Confirmed by  Assessment & Plan:    Principal Problem:   Closed right hip fracture (HCC)   Right hip fracture -Secondary to fall, her CT right hip significant for acute subcapital fracture of the right femoral neck with associated impaction and superolateral displacement. Associated significant flexion at the hip. - Does have chronic right hip pain at baseline, but she does report this much  improved with physical therapy occupational therapy and follow-up with sports medicine, and intra-articular injections, please see HPI. - Admitted under orthopedic hip fracture pathway, - Start on DVT prophylaxis from tomorrow evening pending orthopedic evaluation at [facility] guarding time of surgery. - Continue with as needed pain medications -Check vitamin D    DVT Prophylaxis  Lovenox   AM Labs Ordered, also please review Full Orders  Family Communication: Admission, patients condition and plan of care including tests being ordered have been discussed with the patient and her grandmother who indicate understanding and agree with the plan and Code Status.  Code Status Full  Likely DC to versus subacute rehab  Consults called: Abran, discussed with tabetic PA Ozell Purchase, need to nofity  Dr.Bokshan  or  Dr Georgina from [facility] when she gets to [place]  Admission status: inpatient    Time spent in minutes : 50 minutes   Brayton Lye M.D on 11/01/2023 at 5:04 PM   [facility] - Office  [phone]

## 2023-11-01 NOTE — ED Notes (Signed)
 Pt away at X-ray @ this time

## 2023-11-01 NOTE — ED Notes (Signed)
 [facility] called for transport.

## 2023-11-01 NOTE — ED Provider Notes (Signed)
 [place] EMERGENCY DEPARTMENT AT [facility] Provider Note   CSN: [id number] Arrival date & time: 11/01/23  9046     Patient presents with: Jacqueline Koch LITTIE Jacqueline Koch is a 28 y.o. female.   28 year old female presenting after a fall.  Patient was in [employer] when she slipped in a puddle of water causing her to fall, like I did a split, resulting in significant right hip/inner thigh pain.  History of right hip pain/limp, patient is currently in physical therapy for this, she recently had an MRI of her right hip which showed Significant edema in the right femoral head with suggestion of a possible subchondral microfracture in the superior femoral head, she also had a CT to further evaluate this issue on 9/23 however these results are not yet available.  She reports that after her fall today she was unable to walk and had to be transported via EMS.  Denies head injury/loss of consciousness.    Fall       Prior to Admission medications   Medication Sig Start Date End Date Taking? Authorizing Provider  benzonatate  (TESSALON ) 100 MG capsule Take 1 capsule (100 mg total) by mouth 3 (three) times daily as needed for cough. Do not take with alcohol or while operating or driving heavy machinery 7/81/74   Chandra Harlene LABOR, NP  etonogestrel  (NEXPLANON ) 68 MG IMPL implant 1 each by Subdermal route once.    [provider]  fluticasone  (FLONASE ) 50 MCG/ACT nasal spray Place 1 spray into both nostrils daily. 03/26/23   Chandra Harlene LABOR, NP  lidocaine  (LIDODERM ) 5 % Place 1 patch onto the skin daily. Remove & Discard patch within 12 hours or as directed by MD 06/24/23   Prosperi, Sherlean H, PA-C  methocarbamol  (ROBAXIN ) 500 MG tablet Take 1 tablet (500 mg total) by mouth 2 (two) times daily. 06/24/23   Prosperi, Christian H, PA-C  metroNIDAZOLE  (FLAGYL ) 500 MG tablet Take 1 tablet (500 mg total) by mouth 2 (two) times daily. 06/13/23   Iola Lukes, FNP  predniSONE   (STERAPRED UNI-PAK 21 TAB) 10 MG (21) TBPK tablet 10 mg DS 12 as directed 07/05/23   Onesimo Oneil LABOR, MD    Allergies: Penicillins    Review of Systems  Updated Vital Signs  Vitals:   11/01/23 1357 11/01/23 1506 11/01/23 1515 11/01/23 1636  BP:  (!) 101/59 106/71   Pulse:  98 87 92  Resp:  18 18   Temp:      TempSrc:      SpO2: 98% 97% 98% 98%  Weight:      Height:         Physical Exam Vitals and nursing note reviewed.  HENT:     Head: Normocephalic.  Eyes:     Extraocular Movements: Extraocular movements intact.  Cardiovascular:     Rate and Rhythm: Normal rate.  Pulmonary:     Effort: Pulmonary effort is normal.  Musculoskeletal:     Cervical back: Normal range of motion.     Comments: RLE: Hip TTP at greater trochanter, TTP of inner thigh, no obvious deformity/bruising/edema. Mild TTP of knee, no obvious deformity/edema/bruising. Unable to assess ROM secondary to pain, patient lying on her left side with R hip/knee in flexion.   Skin:    General: Skin is warm and dry.  Neurological:     Mental Status: She is alert and oriented to person, place, and time.     (all labs ordered are listed, but only  abnormal results are displayed) Labs Reviewed  CBC - Abnormal; Notable for the following components:      Result Value   RBC 3.62 (*)    Hemoglobin 11.4 (*)    HCT 34.2 (*)    All other components within normal limits  BASIC METABOLIC PANEL WITH GFR - Abnormal; Notable for the following components:   CO2 20 (*)    All other components within normal limits  HCG, SERUM, QUALITATIVE  VITAMIN D  25 HYDROXY (VIT D DEFICIENCY, FRACTURES)  CBC  BASIC METABOLIC PANEL WITH GFR    EKG: None  Radiology: CT Hip Right Wo Contrast Result Date: 11/01/2023 CLINICAL DATA:  Right hip pain status post fall this morning. Right hip deformity with femoral neck fracture. EXAM: CT OF THE RIGHT HIP WITHOUT CONTRAST TECHNIQUE: Multidetector CT imaging of the right hip was performed  according to the standard protocol. Multiplanar CT image reconstructions were also generated. RADIATION DOSE REDUCTION: This exam was performed according to the departmental dose-optimization program which includes automated exposure control, adjustment of the mA and/or kV according to patient size and/or use of iterative reconstruction technique. COMPARISON:  Same day radiographs of the hips and right femur. CT of the right hip 10/29/2023. FINDINGS: Bones/Joint/Cartilage Positioning limited by the patient's injury. The patient's right hip is flexed approximately 90 degrees and the examination was performed in a decubitus position. There is an acute subcapital fracture of the right femoral neck with associated impaction and superolateral displacement. This appears new compared with the recent hip CT. No evidence of dislocation, underlying significant hip arthropathy or femoral head osteonecrosis. There is a small right hip joint effusion. Ligaments Suboptimally assessed by CT. Muscles and Tendons Unremarkable. Soft tissues No evidence of periarticular hematoma or other focal fluid collection. No evidence of foreign body or soft tissue emphysema. The visualized internal pelvic contents appear unremarkable. IMPRESSION: 1. Acute subcapital fracture of the right femoral neck with associated impaction and superolateral displacement. Associated significant flexion at the hip. 2. No evidence of dislocation or significant hip arthropathy. 3. No evidence of periarticular hematoma or other focal fluid collection. Electronically Signed   By: Elsie Perone M.D.   On: 11/01/2023 14:42   DG Hip Unilat W or Wo Pelvis 2-3 Views Right Result Date: 11/01/2023 CLINICAL DATA:  Status post fall. EXAM: DG HIP (WITH OR WITHOUT PELVIS) 2-3V RIGHT COMPARISON:  None Available. FINDINGS: There is limited patient positioning, secondary to inability of the patient to lower the right leg. A fracture deformity is suspected, extending through  the neck and adjacent portion of the head of the proximal right femur with marked severity anterior angulation of the distal fracture site. There is no evidence of dislocation. There is no evidence of arthropathy or other focal bone abnormality. IMPRESSION: Suspected acute fracture of the proximal right femur. CT correlation is recommended. Electronically Signed   By: Suzen Dials M.D.   On: 11/01/2023 13:34   DG Knee 2 Views Right Result Date: 11/01/2023 EXAM: 1 OR 2 VIEW(S) XRAY OF THE RIGHT KNEE 11/01/2023 12:48:00 PM COMPARISON: None available. CLINICAL HISTORY: Fall, history of suspected subchondral microfracture of superior femoral head. Per triage: Patient at [employer] this morning, hit a slick spot and fell inside the store. Endorses no loss of consciousness, complains of pain with right leg/hip. No medical history. No allergies. Best images possible. Patient unable to lower right leg, patient had right knee against her chest during most of the exam. Tech held leg for multiple images. Patient  stated she had a stress fracture a few months ago and was still in physical therapy. FINDINGS: BONES AND JOINTS: No acute fracture. No focal osseous lesion. No joint dislocation. No significant joint effusion. No significant degenerative changes. SOFT TISSUES: The soft tissues are unremarkable. IMPRESSION: 1. Normal knee radiographs. No acute osseous abnormality. Electronically signed by: Waddell Calk MD 11/01/2023 01:25 PM EDT RP Workstation: [id number]   DG Femur Min 2 Views Right Result Date: 11/01/2023 EXAM: 2 VIEW(S) XRAY OF THE RIGHT FEMUR 11/01/2023 12:48:00 PM COMPARISON: None available. CLINICAL HISTORY: Fall, history of suspected subchondral microfracture of superior femoral head. Per triage: Patient at [employer] this am, hit a slick spot and fell inside the store. Endorses no LOC, complains of pain with right leg/hip. No medical history. No allergies. Best images possible. Patient unable to lower  right leg, patient had right knee against her chest during most of the exam. Tech held leg for multiple images. Patient stated she had a stress fracture a few months ago and was still in physical therapy. FINDINGS: BONES AND JOINTS: Acute right femoral neck fracture with proximal displacement and external rotation of the distal fracture fragment. No focal osseous lesion. No joint dislocation. SOFT TISSUES: The soft tissues are unremarkable. IMPRESSION: 1. Acute right femoral neck fracture with proximal displacement and external rotation of the distal fragment. Electronically signed by: Waddell Calk MD 11/01/2023 01:24 PM EDT RP Workstation: [id number]     Procedures   Medications Ordered in the ED  ketorolac  (TORADOL ) 15 MG/ML injection 15 mg (has no administration in time range)                                    Medical Decision Making This patient presents to the ED for concern of fall, this involves an extensive number of treatment options, and is a complaint that carries with it a high risk of complications and morbidity.  The differential diagnosis includes fracture, dislocation, contusion, muscle strain/sprain/rupture  Additional history obtained:  Additional history obtained from record review External records from outside source obtained and reviewed including recent PT/Ortho notes   Lab Tests:  I Ordered, and personally interpreted labs.  The pertinent results include: CBC notable for hemoglobin of 11.4, this is improved as compared to her most recent baseline of 10 from 3 years ago.  BMP unremarkable.  Serum hCG negative.   Imaging Studies ordered:  I ordered imaging studies including (see below)  I independently visualized and interpreted imaging which showed  - XR R femur: 1. Acute right femoral neck fracture with proximal displacement and external rotation of the distal fragment. - XR R knee: 1. Normal knee radiographs. No acute osseous abnormality. - XR pelvis:  Suspected acute fracture of the proximal right femur. CT correlation is recommended. - CT R hip: 1. Acute subcapital fracture of the right femoral neck with associated impaction and superolateral displacement. Associated significant flexion at the hip. 2. No evidence of dislocation or significant hip arthropathy. 3. No evidence of periarticular hematoma or other focal fluid collection.  I agree with the radiologist interpretation   Cardiac Monitoring: / EKG:  The patient was maintained on a cardiac monitor.  I personally viewed and interpreted the cardiac monitored which showed an underlying rhythm of: NSR   Consultations Obtained:  I requested consultation with the orthopedist,  and discussed lab and imaging findings as well as pertinent plan - they recommend: I spoke  with Dr. Onesimo who requests a CT R Hip for further evaluation of her R femoral neck fracture.  After review of CT imaging, Dr. Onesimo feels that patient may benefit from evaluation by an ortho trauma specialist due to the nature of her injury today. I spoke with Ozell Ned who reviewed the patient's imaging and plans to discuss the case with one of his ortho trauma attendings. He recommends You can admit to [facility]. Notify Mic Moore or whoever is on for [employer] on arrival. Will need surgery but discussing best outcome for her at her young age, may be this weekend or early next week. Admit to medicine. I spoke with Dr. Sherlon who plans to admit this patient with the plan to transfer to [facility] for further evaluation/treatment per ortho recommendations.    Problem List / ED Course / Critical interventions / Medication management  I ordered medication including Toradol  and Dilaudid  for pain Reevaluation of the patient after these medicines showed that the patient improved I have reviewed the patients home medicines and have made adjustments as needed   Social Determinants of Health:  Tobacco use, housing  instability, depression   Test / Admission - Considered:  Physical exam notable as above.  See above for x-ray/CT/lab results, notable for acute right femoral neck fracture, see above for detailed report.  I spoke with the orthopedist as above, Dr. Onesimo recommends further intervention from orthopedic trauma specialist given nature of patient's injury, I spoke with Ozell Ned who recommends admit patient to medicine service at [facility] who will then consult [facility] to discuss her plan of treatment. Dr. Venetta will admit the patient with plan noted as above.     Amount and/or Complexity of Data Reviewed Labs: ordered. Radiology: ordered.  Risk Prescription drug management. Decision regarding hospitalization.        Final diagnoses:  Closed fracture of neck of right femur, initial encounter [facility])    ED Discharge Orders     None          Glendia Rocky LOISE DEVONNA 11/01/23 1708    Francesca Elsie CROME, MD 11/02/23 [phone]

## 2023-11-01 NOTE — ED Triage Notes (Signed)
 Pt at [employer] this am, hit a slick spot and fell inside the store. Endorses no LOC, C/o pain with right leg/hip. No medical history. No allergies.

## 2023-11-02 ENCOUNTER — Encounter (HOSPITAL_COMMUNITY): Payer: Self-pay | Admitting: Internal Medicine

## 2023-11-02 DIAGNOSIS — S72001A Fracture of unspecified part of neck of right femur, initial encounter for closed fracture: Secondary | ICD-10-CM | POA: Diagnosis not present

## 2023-11-02 LAB — CBC
HCT: 33 % — ABNORMAL LOW (ref 36.0–46.0)
Hemoglobin: 11.2 g/dL — ABNORMAL LOW (ref 12.0–15.0)
MCH: 31.9 pg (ref 26.0–34.0)
MCHC: 33.9 g/dL (ref 30.0–36.0)
MCV: 94 fL (ref 80.0–100.0)
Platelets: 199 K/uL (ref 150–400)
RBC: 3.51 MIL/uL — ABNORMAL LOW (ref 3.87–5.11)
RDW: 12.5 % (ref 11.5–15.5)
WBC: 6.5 K/uL (ref 4.0–10.5)
nRBC: 0 % (ref 0.0–0.2)

## 2023-11-02 LAB — BASIC METABOLIC PANEL WITH GFR
Anion gap: 11 (ref 5–15)
BUN: 6 mg/dL (ref 6–20)
CO2: 23 mmol/L (ref 22–32)
Calcium: 8.9 mg/dL (ref 8.9–10.3)
Chloride: 102 mmol/L (ref 98–111)
Creatinine, Ser: 0.57 mg/dL (ref 0.44–1.00)
GFR, Estimated: >60 mL/min (ref >60)
Glucose, Bld: 114 mg/dL — ABNORMAL HIGH (ref 70–99)
Potassium: 3.5 mmol/L (ref 3.5–5.1)
Sodium: 136 mmol/L (ref 135–145)

## 2023-11-02 LAB — TYPE AND SCREEN
ABO/RH(D): A NEG
Antibody Screen: NEGATIVE

## 2023-11-02 LAB — SURGICAL PCR SCREEN
MRSA, PCR: NEGATIVE
Staphylococcus aureus: POSITIVE — AB

## 2023-11-02 MED ORDER — ENOXAPARIN SODIUM 40 MG/0.4ML IJ SOSY: 40.0000 mg | PREFILLED_SYRINGE | INTRAMUSCULAR | Status: DC

## 2023-11-02 MED ORDER — ACETAMINOPHEN 500 MG PO TABS
1000.0000 mg | ORAL_TABLET | Freq: Four times a day (QID) | ORAL | Status: DC | PRN
  Administered 2023-11-02: 1000 mg via ORAL
  Filled 2023-11-02: qty 2

## 2023-11-02 MED ORDER — MELATONIN 5 MG PO TABS: 10.0000 mg | ORAL_TABLET | Freq: Every evening | ORAL | Status: DC | PRN

## 2023-11-02 MED ORDER — DOCUSATE SODIUM 100 MG PO CAPS: 100.0000 mg | ORAL_CAPSULE | Freq: Two times a day (BID) | ORAL | Status: DC

## 2023-11-02 MED ORDER — ONDANSETRON HCL 4 MG/2ML IJ SOLN: 4.0000 mg | Freq: Four times a day (QID) | INTRAMUSCULAR | Status: DC | PRN

## 2023-11-02 MED ORDER — CHLORHEXIDINE GLUCONATE CLOTH 2 % EX PADS
6.0000 | MEDICATED_PAD | Freq: Every day | CUTANEOUS | Status: DC
  Administered 2023-11-03 – 2023-11-04 (×2): 6 via TOPICAL

## 2023-11-02 MED ORDER — HYDROMORPHONE HCL 1 MG/ML IJ SOLN
1.0000 mg | INTRAMUSCULAR | Status: DC | PRN
  Administered 2023-11-02 (×2): 1 mg via INTRAVENOUS
  Filled 2023-11-02 (×4): qty 1

## 2023-11-02 MED ORDER — MUPIROCIN 2 % EX OINT
1.0000 | TOPICAL_OINTMENT | Freq: Two times a day (BID) | CUTANEOUS | Status: DC
  Administered 2023-11-02 – 2023-11-04 (×3): 1 via NASAL
  Filled 2023-11-02: qty 22

## 2023-11-02 NOTE — Subjective & Objective (Signed)
 Pt seen and examined. Met with pt's mother at bedside. Discussed case with Dr. Georgina with ortho. Plans for Dr. Kendal to perform right total hip arthroplasty tomorrow. Pt may eat today. NPO after MN. Continue prn IV dilaudid  for pain.

## 2023-11-02 NOTE — Hospital Course (Addendum)
 CC: right pain, fall HPI: Jacqueline Koch  is a 27 y.o. female, without significant past medical history, complains of chronic right hip pain, she is currently followed by sports medicine doctor at [facility] Dr. Lyle Brunet, ongoing pain since May, much improved after receiving greater trochanteric injection followed by a subsequent intra-articular hip injection on 7/1 and 7/15.  Have MRI of right hip 10/23/2023 was noted for significant edema in the right femoral head with suggestion of possible subchondral microfracture in the superior femoral head, (patient did not have much hip pain by then). - Patient presents to ED secondary to fall, and sudden onset of right hip pain, patient reports she was in [employer], where she slipped and a bottle of water, causing her to fall, she reports (like I did a split), she had significant right hip/inner thigh pain which prompted her to come to ED. -CT right hip was obtained, which was significant for right intertrochanteric fracture, ED discussed with local orthopedic Dr. Kurt who recommended referral to [facility], ED discussed with tabetic PA Ozell Purchase who recommended admission to [facility] and patient will be seen by Ortho care group regarding further surgical intervention.    Significant Events: Admitted 11/01/2023 for right hip pain. Dr. Onesimo on-call for [facility] ortho. Instructed hospitalist to transfer pt to [facility] for admission. 11-03-2023 left total hip arthroplasty  Admission Labs: WBC 8.5, HgB 11.4, plt 196 Na 138, K 3.9, CO2 of 20, BUN 11, Scr 0.48, glu 93 Vit D 36.89  Admission Imaging Studies: XR right femur Acute right femoral neck fracture with proximal displacement and external rotation of the distal fragment. CT right hip Acute subcapital fracture of the right femoral neck with associated impaction and superolateral displacement. Associated significant flexion at the hip. 2. No evidence of dislocation or significant hip  arthropathy. 3. No evidence of periarticular  hematoma or other focal fluid collection.  Significant Labs:   Significant Imaging Studies:   Antibiotic Therapy: Anti-infectives (From admission, onward)    None       Procedures: Left total hip arthroplasty  Consultants: orthopedics

## 2023-11-02 NOTE — Assessment & Plan Note (Signed)
 11/02/23 Discussed case with Dr. Georgina with ortho. Plans for Dr. Kendal to perform right total hip arthroplasty tomorrow. Pt may eat today. NPO after MN. Continue prn IV dilaudid  for pain.   11/04/23 cleared for DC by PT and ortho

## 2023-11-02 NOTE — Plan of Care (Signed)
   Problem: Education: Goal: Knowledge of General Education information will improve Description: Including pain rating scale, medication(s)/side effects and non-pharmacologic comfort measures Outcome: Progressing   Problem: Health Behavior/Discharge Planning: Goal: Ability to manage health-related needs will improve Outcome: Progressing   Problem: Clinical Measurements: Goal: Ability to maintain clinical measurements within normal limits will improve Outcome: Progressing   Problem: Elimination: Goal: Will not experience complications related to bowel motility Outcome: Progressing Goal: Will not experience complications related to urinary retention Outcome: Progressing

## 2023-11-02 NOTE — Consult Note (Signed)
 Orthopedic Surgery Consult Note  Assessment: Patient is a 28 y.o. female with right femoral neck fracture   Plan: -I spent some time with the patient and her mother discussing this fracture.  I showed them the MRI scan of the hip that was done prior to the fracture.  I explained that there was significant signal change within the femoral head that has me concerned that there is another process causing injury to the femoral head such as AVN or rapid destructive arthropathy of the hip due to her cortisone injection.  She also does not have any risk factors for stress fracture so I do not feel that this is from an unrecognized structural fracture.  Due to these changes within the femoral head, I told the patient and her mother that my concern would be that her risk of failure of fixation may be higher than the average patient.  Discussed total hip replacement as another option.  I explained that total hip replacement would likely set her up for a revision years down the road.  I covered the pros and cons of each of these procedures with them.  The patient wanted to think about it more.  Her mother seem to be more inclined towards total hip replacement.  Given the complexity of this case due to that signal change seen within the head femur on MRI, I have discussed her care with Dr. Kendal who Koch meet with her tomorrow to discuss with her as well and Koch plan to take over her operative care.  -NPO at midnight -NWB RLE  ___________________________________________________________________________   Reason for consult: right femoral neck fracture  History:  Patient is a 28 y.o. female who has had several months of right hip pain.  She has been seeing Dr. Onesimo and Dr. Burnetta about this hip pain.  She has undergone outpatient imaging and injections.  She did get a trochanteric bursa and intra-articular hip injection for this pain.  Her pain had not been getting better with time.  She then had a  ground-level fall yesterday and noted acute worsening of her right hip pain.  She was unable to ambulate or weight-bear.  She was brought to [facility] emergency department where she was diagnosed with a femoral neck fracture. Dr. Onesimo requested her transfer to [facility] for further treatment.  She is still reporting right hip pain this morning.  No pain elsewhere in the extremities.  No family or personal known history of sickle cell anemia.  Reports that she drinks socially but does not drink regularly or very much.  Has not been on steroids.  No history of chemo or radiation.  She denies any eating disorder.  She does not workout regularly or do any kind of long distance running.  She was having regular menses prior to placement of a Nexplanon  for birth control.   Review of systems: General: denies fevers and chills, myalgias Neurologic: denies recent changes in vision, slurred speech Abdomen: denies nausea, vomiting, hematemesis Respiratory: denies cough, shortness of breath  Past medical history:  Syncope  Allergies: penicillin    Past surgical history:  None  Social history: Reports use of nicotine-containing products (cigarettes, vaping, smokeless, etc.) Alcohol use: social  Family history: -no known history of sickle cell anemia -remainder of family history non-contributory for femoral neck fracture   Physical Exam:  BMI of 29.3  General: no acute distress, appears stated age Neurologic: alert, answering questions appropriately, following commands Cardiovascular: regular rate, no cyanosis Respiratory: unlabored breathing  on room air, symmetric chest rise Psychiatric: appropriate affect, normal cadence to speech  MSK:   -Bilateral upper extremities  No tenderness to palpation over the extremities, no open wounds, no gross deformity Fires deltoid, biceps, triceps, wrist extensors, wrist flexors, finger extensors, finger flexors  AIN/PIN/IO intact  Palpable  radial pulse  Sensation intact to light touch in median/ulnar/radial/axillary nerve distributions  Hand warm and well perfused  -Left lower extremity  No tenderness to palpation over the extremity, no pain with logroll, no gross deformity, no open wounds Fires hip flexors, quadriceps, hamstrings, tibialis anterior, gastrocnemius and soleus, extensor hallucis longus Plantarflexes and dorsiflexes toes Sensation intact to light touch in sural, saphenous, tibial, deep peroneal, and superficial peroneal nerve distributions Foot warm and well perfused, palpable DP pulse  -Right lower extremity  TTP around the hip, no other tenderness palpation over the remainder of the extremity, patient with hip and flexed position with pillow under the thigh.  No gross deformity seen but difficult to tell with the patient positioning where she is most comfortable.  No open wounds seen.  Pain with logroll Does not dire hip flexors due to pain. Fires quadriceps, hamstrings, tibialis anterior, gastrocnemius and soleus, extensor hallucis longus Plantarflexes and dorsiflexes toes Sensation intact to light touch in sural, saphenous, tibial, deep peroneal, and superficial peroneal nerve distributions Foot warm and well perfused, palpable DP pulse  Imaging: CT of the right hip from 11/01/2023 was independently reviewed and interpreted, showing a minimally displaced subcapital femoral neck fracture with inferior translation.  No other fracture seen.  No dislocation seen.   Patient name: Jacqueline Koch Patient MRN: [id number] Date: 11/02/23

## 2023-11-02 NOTE — Progress Notes (Addendum)
 PROGRESS NOTE    Jacqueline Koch  FMW:[id number] DOB: 25-Apr-1995 DOA: 11/01/2023 PCP: Pcp, No  Subjective: Pt seen and examined. Met with pt's mother at bedside. Discussed case with Dr. Georgina with ortho. Plans for Dr. Kendal to perform right total hip arthroplasty tomorrow. Pt may eat today. NPO after MN. Continue prn IV dilaudid  for pain.   Hospital Course: CC: right pain, fall HPI: Jacqueline Koch  is a 28 y.o. female, without significant past medical history, complains of chronic right hip pain, she is currently followed by sports medicine doctor at [facility] Dr. Lyle Brunet, ongoing pain since May, much improved after receiving greater trochanteric injection followed by a subsequent intra-articular hip injection on 7/1 and 7/15.  Have MRI of right hip 10/23/2023 was noted for significant edema in the right femoral head with suggestion of possible subchondral microfracture in the superior femoral head, (patient did not have much hip pain by then). - Patient presents to ED secondary to fall, and sudden onset of right hip pain, patient reports she was in [employer], where she slipped and a bottle of water, causing her to fall, she reports (like I did a split), she had significant right hip/inner thigh pain which prompted her to come to ED. -CT right hip was obtained, which was significant for right intertrochanteric fracture, ED discussed with local orthopedic Dr. Kurt who recommended referral to [facility], ED discussed with tabetic PA Ozell Purchase who recommended admission to [facility] and patient will be seen by Ortho care group regarding further surgical intervention.    Significant Events: Admitted 11/01/2023 for right hip pain. Dr. Onesimo on-call for [facility] ortho. Instructed hospitalist to transfer pt to [facility] for admission.   Admission Labs: WBC 8.5, HgB 11.4, plt 196 Na 138, K 3.9, CO2 of 20, BUN 11, Scr 0.48, glu 93 Vit D 36.89  Admission Imaging Studies: XR  right femur Acute right femoral neck fracture with proximal displacement and external rotation of the distal fragment. CT right hip Acute subcapital fracture of the right femoral neck with associated impaction and superolateral displacement. Associated significant flexion at the hip. 2. No evidence of dislocation or significant hip arthropathy. 3. No evidence of periarticular  hematoma or other focal fluid collection.  Significant Labs:   Significant Imaging Studies:   Antibiotic Therapy: Anti-infectives (From admission, onward)    None       Procedures:   Consultants: orthopedics    Assessment and Plan: * Closed right hip fracture (HCC) 11/02/23 Discussed case with Dr. Georgina with ortho. Plans for Dr. Kendal to perform right total hip arthroplasty tomorrow. Pt may eat today. NPO after MN. Continue prn IV dilaudid  for pain.        DVT prophylaxis: enoxaparin  (LOVENOX ) injection 40 mg Start: 11/04/23 1000 SCDs Start: 11/01/23 1706    Code Status: Full Code Family Communication: discussed at bedside with pt and her mother Disposition Plan: return home with mother Reason for continuing need for hospitalization: needs ORIF of right hip fracture  Objective: Vitals:   11/02/23 0003 11/02/23 0513 11/02/23 0814 11/02/23 1209  BP: 109/63 117/64 114/75 118/79  Pulse: 76 75 71 65  Resp: 16 19 16    Temp: 98.3 F (36.8 C) 98.8 F (37.1 C) 98.2 F (36.8 C) 98.2 F (36.8 C)  TempSrc:   Oral Oral  SpO2:  99% 98% 98%  Weight:      Height:        Intake/Output Summary (Last 24 hours)  at 11/02/2023 1342 Last data filed at 11/02/2023 1100 Gross per 24 hour  Intake 360 ml  Output --  Net 360 ml   Filed Weights   11/01/23 1005  Weight: 65.8 kg    Examination:  Physical Exam Vitals and nursing note reviewed.  Constitutional:      General: She is not in acute distress.    Appearance: She is not toxic-appearing or diaphoretic.  HENT:     Head: Normocephalic and  atraumatic.  Eyes:     General: No scleral icterus. Cardiovascular:     Rate and Rhythm: Normal rate and regular rhythm.  Pulmonary:     Effort: Pulmonary effort is normal.     Breath sounds: Normal breath sounds.  Abdominal:     General: Bowel sounds are normal. There is no distension.     Palpations: Abdomen is soft.     Tenderness: There is no abdominal tenderness.  Skin:    General: Skin is warm and dry.     Capillary Refill: Capillary refill takes less than 2 seconds.  Neurological:     General: No focal deficit present.     Mental Status: She is alert and oriented to person, place, and time.     Data Reviewed: I have personally reviewed following labs and imaging studies  CBC: Recent Labs  Lab 11/01/23 1526  WBC 8.5  HGB 11.4*  HCT 34.2*  MCV 94.5  PLT 196   Basic Metabolic Panel: Recent Labs  Lab 11/01/23 1526  NA 138  K 3.9  CL 105  CO2 20*  GLUCOSE 93  BUN 11  CREATININE 0.48  CALCIUM 8.9   GFR: Estimated Creatinine Clearance: 86.3 mL/min (by C-G formula based on SCr of 0.48 mg/dL).  Radiology Studies: CT Hip Right Wo Contrast Result Date: 11/01/2023 CLINICAL DATA:  Right hip pain status post fall this morning. Right hip deformity with femoral neck fracture. EXAM: CT OF THE RIGHT HIP WITHOUT CONTRAST TECHNIQUE: Multidetector CT imaging of the right hip was performed according to the standard protocol. Multiplanar CT image reconstructions were also generated. RADIATION DOSE REDUCTION: This exam was performed according to the departmental dose-optimization program which includes automated exposure control, adjustment of the mA and/or kV according to patient size and/or use of iterative reconstruction technique. COMPARISON:  Same day radiographs of the hips and right femur. CT of the right hip 10/29/2023. FINDINGS: Bones/Joint/Cartilage Positioning limited by the patient's injury. The patient's right hip is flexed approximately 90 degrees and the examination  was performed in a decubitus position. There is an acute subcapital fracture of the right femoral neck with associated impaction and superolateral displacement. This appears new compared with the recent hip CT. No evidence of dislocation, underlying significant hip arthropathy or femoral head osteonecrosis. There is a small right hip joint effusion. Ligaments Suboptimally assessed by CT. Muscles and Tendons Unremarkable. Soft tissues No evidence of periarticular hematoma or other focal fluid collection. No evidence of foreign body or soft tissue emphysema. The visualized internal pelvic contents appear unremarkable. IMPRESSION: 1. Acute subcapital fracture of the right femoral neck with associated impaction and superolateral displacement. Associated significant flexion at the hip. 2. No evidence of dislocation or significant hip arthropathy. 3. No evidence of periarticular hematoma or other focal fluid collection. Electronically Signed   By: Elsie Perone M.D.   On: 11/01/2023 14:42   DG Hip Unilat W or Wo Pelvis 2-3 Views Right Result Date: 11/01/2023 CLINICAL DATA:  Status post fall. EXAM: DG HIP (  WITH OR WITHOUT PELVIS) 2-3V RIGHT COMPARISON:  None Available. FINDINGS: There is limited patient positioning, secondary to inability of the patient to lower the right leg. A fracture deformity is suspected, extending through the neck and adjacent portion of the head of the proximal right femur with marked severity anterior angulation of the distal fracture site. There is no evidence of dislocation. There is no evidence of arthropathy or other focal bone abnormality. IMPRESSION: Suspected acute fracture of the proximal right femur. CT correlation is recommended. Electronically Signed   By: Suzen Dials M.D.   On: 11/01/2023 13:34   DG Knee 2 Views Right Result Date: 11/01/2023 EXAM: 1 OR 2 VIEW(S) XRAY OF THE RIGHT KNEE 11/01/2023 12:48:00 PM COMPARISON: None available. CLINICAL HISTORY: Fall, history of  suspected subchondral microfracture of superior femoral head. Per triage: Patient at [employer] this morning, hit a slick spot and fell inside the store. Endorses no loss of consciousness, complains of pain with right leg/hip. No medical history. No allergies. Best images possible. Patient unable to lower right leg, patient had right knee against her chest during most of the exam. Tech held leg for multiple images. Patient stated she had a stress fracture a few months ago and was still in physical therapy. FINDINGS: BONES AND JOINTS: No acute fracture. No focal osseous lesion. No joint dislocation. No significant joint effusion. No significant degenerative changes. SOFT TISSUES: The soft tissues are unremarkable. IMPRESSION: 1. Normal knee radiographs. No acute osseous abnormality. Electronically signed by: Waddell Calk MD 11/01/2023 01:25 PM EDT RP Workstation: [id number]   DG Femur Min 2 Views Right Result Date: 11/01/2023 EXAM: 2 VIEW(S) XRAY OF THE RIGHT FEMUR 11/01/2023 12:48:00 PM COMPARISON: None available. CLINICAL HISTORY: Fall, history of suspected subchondral microfracture of superior femoral head. Per triage: Patient at [employer] this am, hit a slick spot and fell inside the store. Endorses no LOC, complains of pain with right leg/hip. No medical history. No allergies. Best images possible. Patient unable to lower right leg, patient had right knee against her chest during most of the exam. Tech held leg for multiple images. Patient stated she had a stress fracture a few months ago and was still in physical therapy. FINDINGS: BONES AND JOINTS: Acute right femoral neck fracture with proximal displacement and external rotation of the distal fracture fragment. No focal osseous lesion. No joint dislocation. SOFT TISSUES: The soft tissues are unremarkable. IMPRESSION: 1. Acute right femoral neck fracture with proximal displacement and external rotation of the distal fragment. Electronically signed by:  Waddell Calk MD 11/01/2023 01:24 PM EDT RP Workstation: [id number]    Scheduled Meds:  [START ON 11/04/2023] enoxaparin  (LOVENOX ) injection  40 mg Subcutaneous Q24H   senna  1 tablet Oral BID   Continuous Infusions:   LOS: 1 day   Time spent: 55 minutes  Camellia Door, DO  [facility]  11/02/2023, 1:42 PM

## 2023-11-03 ENCOUNTER — Inpatient Hospital Stay (HOSPITAL_COMMUNITY)

## 2023-11-03 ENCOUNTER — Inpatient Hospital Stay (HOSPITAL_COMMUNITY): Payer: Self-pay | Admitting: Anesthesiology

## 2023-11-03 ENCOUNTER — Other Ambulatory Visit: Payer: Self-pay

## 2023-11-03 ENCOUNTER — Encounter (HOSPITAL_COMMUNITY)
Admission: EM | Disposition: A | Discharge status: Home / Self Care | Payer: Self-pay | Source: Home / Self Care | Attending: Internal Medicine

## 2023-11-03 ENCOUNTER — Encounter (HOSPITAL_COMMUNITY): Payer: Self-pay | Admitting: Internal Medicine

## 2023-11-03 DIAGNOSIS — S72001A Fracture of unspecified part of neck of right femur, initial encounter for closed fracture: Secondary | ICD-10-CM

## 2023-11-03 DIAGNOSIS — Z96641 Presence of right artificial hip joint: Secondary | ICD-10-CM

## 2023-11-03 HISTORY — PX: TOTAL HIP ARTHROPLASTY: SHX124

## 2023-11-03 SURGERY — ARTHROPLASTY, HIP, TOTAL, ANTERIOR APPROACH
Anesthesia: General | Site: Hip | Laterality: Right

## 2023-11-03 MED ORDER — HYDROMORPHONE HCL 1 MG/ML IJ SOLN
INTRAMUSCULAR | Status: AC
  Filled 2023-11-03: qty 1

## 2023-11-03 MED ORDER — VANCOMYCIN HCL 1000 MG IV SOLR
INTRAVENOUS | Status: AC
  Filled 2023-11-03: qty 20

## 2023-11-03 MED ORDER — ALBUMIN HUMAN 5 % IV SOLN: INTRAVENOUS | Status: DC | PRN

## 2023-11-03 MED ORDER — PROSOURCE PLUS PO LIQD
30.0000 mL | Freq: Every day | ORAL | Status: DC
  Administered 2023-11-04: 30 mL via ORAL
  Filled 2023-11-03: qty 30

## 2023-11-03 MED ORDER — PHENOL 1.4 % MT LIQD
1.0000 | OROMUCOSAL | Status: DC | PRN
  Filled 2023-11-03: qty 177

## 2023-11-03 MED ORDER — CHLORHEXIDINE GLUCONATE 4 % EX SOLN: 1.0000 | CUTANEOUS | 1 refills | Status: DC

## 2023-11-03 MED ORDER — MIDAZOLAM HCL 2 MG/2ML IJ SOLN
INTRAMUSCULAR | Status: AC
  Filled 2023-11-03: qty 2

## 2023-11-03 MED ORDER — BISACODYL 5 MG PO TBEC: 5.0000 mg | DELAYED_RELEASE_TABLET | Freq: Every day | ORAL | Status: DC | PRN

## 2023-11-03 MED ORDER — DEXAMETHASONE SODIUM PHOSPHATE 10 MG/ML IJ SOLN
INTRAMUSCULAR | Status: AC
  Filled 2023-11-03: qty 1

## 2023-11-03 MED ORDER — OXYCODONE HCL 5 MG PO TABS: 5.0000 mg | ORAL_TABLET | Freq: Once | ORAL | Status: DC | PRN

## 2023-11-03 MED ORDER — PROPOFOL 10 MG/ML IV BOLUS
INTRAVENOUS | Status: AC
  Filled 2023-11-03: qty 20

## 2023-11-03 MED ORDER — PHENYLEPHRINE 80 MCG/ML (10ML) SYRINGE FOR IV PUSH (FOR BLOOD PRESSURE SUPPORT)
PREFILLED_SYRINGE | INTRAVENOUS | Status: DC | PRN
  Administered 2023-11-03: 80 ug via INTRAVENOUS

## 2023-11-03 MED ORDER — HYDROMORPHONE HCL 1 MG/ML IJ SOLN
0.5000 mg | INTRAMUSCULAR | Status: DC | PRN
  Administered 2023-11-03 (×2): 0.5 mg via INTRAVENOUS

## 2023-11-03 MED ORDER — OXYCODONE HCL 5 MG/5ML PO SOLN: 5.0000 mg | Freq: Once | ORAL | Status: DC | PRN

## 2023-11-03 MED ORDER — TRANEXAMIC ACID-NACL 1000-0.7 MG/100ML-% IV SOLN
1000.0000 mg | Freq: Once | INTRAVENOUS | Status: AC
  Administered 2023-11-03: 1000 mg via INTRAVENOUS
  Filled 2023-11-03: qty 100

## 2023-11-03 MED ORDER — PROPOFOL 10 MG/ML IV BOLUS
INTRAVENOUS | Status: DC | PRN
  Administered 2023-11-03: 100 mg via INTRAVENOUS

## 2023-11-03 MED ORDER — METOCLOPRAMIDE HCL 5 MG/ML IJ SOLN: 5.0000 mg | Freq: Three times a day (TID) | INTRAMUSCULAR | Status: DC | PRN

## 2023-11-03 MED ORDER — SODIUM CHLORIDE 0.9 % IR SOLN
Status: DC | PRN
  Administered 2023-11-03: 1000 mL

## 2023-11-03 MED ORDER — ASPIRIN 325 MG PO TABS
325.0000 mg | ORAL_TABLET | Freq: Every day | ORAL | Status: DC
  Administered 2023-11-04: 325 mg via ORAL
  Filled 2023-11-03: qty 1

## 2023-11-03 MED ORDER — MENTHOL 3 MG MT LOZG: 1.0000 | LOZENGE | OROMUCOSAL | Status: DC | PRN

## 2023-11-03 MED ORDER — ORAL CARE MOUTH RINSE: 15.0000 mL | Freq: Once | OROMUCOSAL | Status: AC

## 2023-11-03 MED ORDER — CELECOXIB 200 MG PO CAPS
200.0000 mg | ORAL_CAPSULE | Freq: Two times a day (BID) | ORAL | Status: DC
Start: 2023-11-03 — End: 2023-11-04
  Administered 2023-11-03 – 2023-11-04 (×3): 200 mg via ORAL
  Filled 2023-11-03 (×3): qty 1

## 2023-11-03 MED ORDER — PHENYLEPHRINE 80 MCG/ML (10ML) SYRINGE FOR IV PUSH (FOR BLOOD PRESSURE SUPPORT)
PREFILLED_SYRINGE | INTRAVENOUS | Status: AC
  Filled 2023-11-03: qty 10

## 2023-11-03 MED ORDER — ONDANSETRON HCL 4 MG/2ML IJ SOLN
INTRAMUSCULAR | Status: DC | PRN
  Administered 2023-11-03: 4 mg via INTRAVENOUS

## 2023-11-03 MED ORDER — ROCURONIUM BROMIDE 10 MG/ML (PF) SYRINGE
PREFILLED_SYRINGE | INTRAVENOUS | Status: DC | PRN
  Administered 2023-11-03: 10 mg via INTRAVENOUS
  Administered 2023-11-03: 50 mg via INTRAVENOUS
  Administered 2023-11-03: 20 mg via INTRAVENOUS

## 2023-11-03 MED ORDER — CEFAZOLIN SODIUM-DEXTROSE 2-4 GM/100ML-% IV SOLN
2.0000 g | Freq: Once | INTRAVENOUS | Status: AC
  Administered 2023-11-03: 2 g via INTRAVENOUS

## 2023-11-03 MED ORDER — DEXAMETHASONE SODIUM PHOSPHATE 10 MG/ML IJ SOLN
INTRAMUSCULAR | Status: DC | PRN
  Administered 2023-11-03: 10 mg via INTRAVENOUS

## 2023-11-03 MED ORDER — METOCLOPRAMIDE HCL 5 MG PO TABS: 5.0000 mg | ORAL_TABLET | Freq: Three times a day (TID) | ORAL | Status: DC | PRN

## 2023-11-03 MED ORDER — CHLORHEXIDINE GLUCONATE 0.12 % MT SOLN
15.0000 mL | Freq: Once | OROMUCOSAL | Status: AC
  Administered 2023-11-03: 15 mL via OROMUCOSAL

## 2023-11-03 MED ORDER — CEFAZOLIN SODIUM-DEXTROSE 2-4 GM/100ML-% IV SOLN
INTRAVENOUS | Status: AC
  Filled 2023-11-03: qty 100

## 2023-11-03 MED ORDER — 0.9 % SODIUM CHLORIDE (POUR BTL) OPTIME
TOPICAL | Status: DC | PRN
  Administered 2023-11-03: 1000 mL

## 2023-11-03 MED ORDER — VANCOMYCIN HCL 1000 MG IV SOLR
INTRAVENOUS | Status: DC | PRN
  Administered 2023-11-03: 1000 mg via TOPICAL

## 2023-11-03 MED ORDER — ONDANSETRON HCL 4 MG/2ML IJ SOLN: 4.0000 mg | Freq: Once | INTRAMUSCULAR | Status: DC | PRN

## 2023-11-03 MED ORDER — DOCUSATE SODIUM 100 MG PO CAPS
100.0000 mg | ORAL_CAPSULE | Freq: Two times a day (BID) | ORAL | Status: DC
  Administered 2023-11-03 – 2023-11-04 (×3): 100 mg via ORAL
  Filled 2023-11-03 (×3): qty 1

## 2023-11-03 MED ORDER — LACTATED RINGERS IV SOLN: INTRAVENOUS | Status: DC

## 2023-11-03 MED ORDER — FENTANYL CITRATE (PF) 250 MCG/5ML IJ SOLN
INTRAMUSCULAR | Status: AC
  Filled 2023-11-03: qty 5

## 2023-11-03 MED ORDER — ACETAMINOPHEN 500 MG PO TABS
1000.0000 mg | ORAL_TABLET | Freq: Four times a day (QID) | ORAL | Status: DC
  Administered 2023-11-03 – 2023-11-04 (×4): 1000 mg via ORAL
  Filled 2023-11-03 (×5): qty 2

## 2023-11-03 MED ORDER — HYDROMORPHONE HCL 1 MG/ML IJ SOLN: 1.0000 mg | INTRAMUSCULAR | Status: DC | PRN

## 2023-11-03 MED ORDER — EPHEDRINE 5 MG/ML INJ
INTRAVENOUS | Status: AC
  Filled 2023-11-03: qty 5

## 2023-11-03 MED ORDER — ROCURONIUM BROMIDE 10 MG/ML (PF) SYRINGE
PREFILLED_SYRINGE | INTRAVENOUS | Status: AC
  Filled 2023-11-03: qty 10

## 2023-11-03 MED ORDER — OXYCODONE HCL 5 MG PO TABS
5.0000 mg | ORAL_TABLET | ORAL | Status: DC | PRN
  Administered 2023-11-03 – 2023-11-04 (×4): 15 mg via ORAL
  Filled 2023-11-03 (×4): qty 3

## 2023-11-03 MED ORDER — CEFAZOLIN SODIUM-DEXTROSE 2-4 GM/100ML-% IV SOLN: 2.0000 g | Freq: Four times a day (QID) | INTRAVENOUS | Status: DC

## 2023-11-03 MED ORDER — ADULT MULTIVITAMIN W/MINERALS CH
1.0000 | ORAL_TABLET | Freq: Every day | ORAL | Status: DC
  Administered 2023-11-03 – 2023-11-04 (×2): 1 via ORAL
  Filled 2023-11-03 (×2): qty 1

## 2023-11-03 MED ORDER — MUPIROCIN 2 % EX OINT: 1.0000 | TOPICAL_OINTMENT | Freq: Two times a day (BID) | CUTANEOUS | 0 refills | Status: DC

## 2023-11-03 MED ORDER — LIDOCAINE 2% (20 MG/ML) 5 ML SYRINGE
INTRAMUSCULAR | Status: AC
  Filled 2023-11-03: qty 5

## 2023-11-03 MED ORDER — SODIUM CHLORIDE 0.9 % IV SOLN: INTRAVENOUS | Status: AC

## 2023-11-03 MED ORDER — FENTANYL CITRATE (PF) 100 MCG/2ML IJ SOLN
25.0000 ug | INTRAMUSCULAR | Status: DC | PRN
  Administered 2023-11-03: 50 ug via INTRAVENOUS
  Administered 2023-11-03: 100 ug via INTRAVENOUS
  Administered 2023-11-03 (×2): 50 ug via INTRAVENOUS

## 2023-11-03 MED ORDER — LIDOCAINE 2% (20 MG/ML) 5 ML SYRINGE
INTRAMUSCULAR | Status: DC | PRN
  Administered 2023-11-03: 100 mg via INTRAVENOUS

## 2023-11-03 MED ORDER — ACETAMINOPHEN 10 MG/ML IV SOLN
INTRAVENOUS | Status: AC
  Filled 2023-11-03: qty 100

## 2023-11-03 MED ORDER — CEFAZOLIN SODIUM-DEXTROSE 2-4 GM/100ML-% IV SOLN
2.0000 g | Freq: Three times a day (TID) | INTRAVENOUS | Status: DC
  Administered 2023-11-03 – 2023-11-04 (×2): 2 g via INTRAVENOUS
  Filled 2023-11-03 (×2): qty 100

## 2023-11-03 MED ORDER — MIDAZOLAM HCL 2 MG/2ML IJ SOLN
INTRAMUSCULAR | Status: DC | PRN
  Administered 2023-11-03 (×2): 1 mg via INTRAVENOUS

## 2023-11-03 MED ORDER — ACETAMINOPHEN 10 MG/ML IV SOLN
1000.0000 mg | Freq: Once | INTRAVENOUS | Status: DC | PRN
  Administered 2023-11-03: 990 mg via INTRAVENOUS

## 2023-11-03 MED ORDER — SUGAMMADEX SODIUM 200 MG/2ML IV SOLN
INTRAVENOUS | Status: DC | PRN
  Administered 2023-11-03: 150 mg via INTRAVENOUS

## 2023-11-03 MED ORDER — ONDANSETRON HCL 4 MG/2ML IJ SOLN
INTRAMUSCULAR | Status: AC
  Filled 2023-11-03: qty 2

## 2023-11-03 SURGICAL SUPPLY — 50 items
BAG COUNTER SPONGE SURGICOUNT (BAG) ×1 IMPLANT
BIT DRILL 7/64X5 DISP (BIT) ×1 IMPLANT
BIT DRILL RINGLOC 3.2MMX20 (BIT) IMPLANT
BIT DRILL RINGLOC QUICK CONN (BIT) IMPLANT
BLADE SAW SGTL 18X1.27X75 (BLADE) ×1 IMPLANT
BNDG COHESIVE 6X5 TAN ST LF (GAUZE/BANDAGES/DRESSINGS) ×1 IMPLANT
CHLORAPREP W/TINT 26 (MISCELLANEOUS) ×1 IMPLANT
COVER PERINEAL POST (MISCELLANEOUS) ×1 IMPLANT
COVER SURGICAL LIGHT HANDLE (MISCELLANEOUS) ×1 IMPLANT
DERMABOND ADVANCED .7 DNX12 (GAUZE/BANDAGES/DRESSINGS) ×1 IMPLANT
DRAPE C-ARM 42X72 X-RAY (DRAPES) ×1 IMPLANT
DRAPE IMP U-DRAPE 54X76 (DRAPES) ×1 IMPLANT
DRAPE STERI IOBAN 125X83 (DRAPES) ×1 IMPLANT
DRAPE U-SHAPE 47X51 STRL (DRAPES) IMPLANT
DRESSING AQUACEL AG SP 3.5X6 (GAUZE/BANDAGES/DRESSINGS) ×1 IMPLANT
DRSG AQUACEL AG ADV 3.5X 6 (GAUZE/BANDAGES/DRESSINGS) IMPLANT
DRSG MEPILEX POST OP 4X8 (GAUZE/BANDAGES/DRESSINGS) IMPLANT
ELECT BLADE 6.5 EXT (BLADE) IMPLANT
ELECT CAUTERY BLADE 6.4 (BLADE) IMPLANT
ELECTRODE REM PT RTRN 9FT ADLT (ELECTROSURGICAL) ×1 IMPLANT
GLOVE BIO SURGEON STRL SZ 6.5 (GLOVE) ×3 IMPLANT
GLOVE BIO SURGEON STRL SZ7.5 (GLOVE) ×3 IMPLANT
GLOVE BIOGEL PI IND STRL 6.5 (GLOVE) ×1 IMPLANT
GLOVE BIOGEL PI IND STRL 7.5 (GLOVE) ×1 IMPLANT
GOWN STRL REUS W/ TWL LRG LVL3 (GOWN DISPOSABLE) ×2 IMPLANT
HEAD CERAMIC BIOLOX 32 TP1 -3 (Head) IMPLANT
HOOD PEEL AWAY T7 (MISCELLANEOUS) ×3 IMPLANT
KIT BASIN OR (CUSTOM PROCEDURE TRAY) ×1 IMPLANT
KIT TURNOVER KIT B (KITS) ×1 IMPLANT
LINER ACETAB NEUTRAL 7/C 32 (Liner) IMPLANT
MANIFOLD NEPTUNE II (INSTRUMENTS) ×1 IMPLANT
PACK TOTAL JOINT (CUSTOM PROCEDURE TRAY) ×1 IMPLANT
PACK UNIVERSAL I (CUSTOM PROCEDURE TRAY) ×1 IMPLANT
PAD ARMBOARD POSITIONER FOAM (MISCELLANEOUS) ×2 IMPLANT
SCREW BONE 6.5X35 SELF TAP (Screw) IMPLANT
SET INTERPULSE LAVAGE W/TIP (ORTHOPEDIC DISPOSABLE SUPPLIES) ×1 IMPLANT
SHELL ACET G7 3H 48 SZC (Shell) IMPLANT
SOLN 0.9% NACL 1000 ML (IV SOLUTION) ×1 IMPLANT
SOLN 0.9% NACL POUR BTL 1000ML (IV SOLUTION) ×1 IMPLANT
SOLN STERILE WATER 1000 ML (IV SOLUTION) ×1 IMPLANT
SOLN STERILE WATER BTL 1000 ML (IV SOLUTION) ×1 IMPLANT
STAPLER SKIN PROX 35W (STAPLE) IMPLANT
STEM FEM CEMLS SZ 13 133D (Stem) IMPLANT
SUT ETHIBOND NAB CT1 #1 30IN (SUTURE) ×1 IMPLANT
SUT MNCRL AB 3-0 PS2 18 (SUTURE) IMPLANT
SUT MON AB 2-0 CT1 36 (SUTURE) IMPLANT
SUT MON AB 2-0 SH 27 (SUTURE) ×1 IMPLANT
SUT VIC AB 0 CT1 27XBRD ANBCTR (SUTURE) IMPLANT
TOWEL GREEN STERILE (TOWEL DISPOSABLE) ×1 IMPLANT
TUBE SUCT ARGYLE STRL (TUBING) ×1 IMPLANT

## 2023-11-03 NOTE — Transfer of Care (Signed)
 Immediate Anesthesia Transfer of Care Note  Patient: Jacqueline Koch  Procedure(s) Performed: ARTHROPLASTY, HIP, TOTAL, ANTERIOR APPROACH VS POSSIBLE SCREW FIXATION (Right: Hip)  Patient Location: PACU  Anesthesia Type:General  Level of Consciousness: awake, alert , and oriented  Airway & Oxygen Therapy: Patient Spontanous Breathing  Post-op Assessment: Report given to RN and Post -op Vital signs reviewed and stable  Post vital signs: Reviewed and stable  Last Vitals:  Vitals Value Taken Time  BP 133/91 11/03/23 10:16  Temp 36.7 C 11/03/23 10:16  Pulse 82 11/03/23 10:20  Resp 17 11/03/23 10:20  SpO2 98 % 11/03/23 10:20  Vitals shown include unfiled device data.  Last Pain:  Vitals:   11/03/23 0656  TempSrc: Oral  PainSc:          Complications: No notable events documented.

## 2023-11-03 NOTE — Assessment & Plan Note (Signed)
 11/03/23 had surgery today.  11/04/23 cleared for DC by PT and ortho

## 2023-11-03 NOTE — Progress Notes (Signed)
 Initial Nutrition Assessment  DOCUMENTATION CODES:   Not applicable  INTERVENTION:  Add 30 ml ProSource Plus daily, each supplement provides 100 kcals and 15 grams protein.   Collect new weight to assess trend Monitor bowel frequency and assess the need for modification to bowel regimen Double portion proteins w/ meals  NUTRITION DIAGNOSIS:  Increased nutrient needs related to post-op healing as evidenced by estimated needs.  GOAL:  Patient will meet greater than or equal to 90% of their needs  MONITOR:  PO intake, Supplement acceptance  REASON FOR ASSESSMENT:  Consult Assessment of nutrition requirement/status  ASSESSMENT:   Pt with no significant PMH. Presents to  [facility] ED s/p fall with right intertrochanetric fracture requiring surgical intervention. Subsequently transferred to [facility].  Of note, has been following with sports medicine MD d/t ongoing chronic R hip pain since May.  9/26 admitted 9/28 surgical repair  RD working remotely. Unable to speak with patient as she was down for surgery at time of attempted call to her room. Therefore, most of nutrition history obtained from chart review.   Average Meal Intake 9/27: 60% x1 meal documented  Unable to collect 24-hour recall or recent level of intake. Does have >50% intake x1 meal documented since admission with no documented difficulties chewing or swallowing.   Given recent surgery, will recommend MVI, double portion proteins, and prosource once daily to aid in wound healing until intake can be assessed. Continues on regular diet.   Admit Weight: 65.8 kg - appears stated  Per chart review, admission weight appears stated. Will request new weight collection to assess recent rend. No significant weight loss or gain noted over last year. Weight gain of 10% noted, but not significant. BMI classifies her as overweight for age, but does not take body composition into account. No significant edema documented.  Meds:  docusate sodium , senna, IV ABX  Labs from 9/26 reviewed and unremarkable     NUTRITION - FOCUSED PHYSICAL EXAM: Unable to complete d/t RD working remotely; will complete on follow up  Diet Order:   Diet Order             Diet regular Room service appropriate? Yes; Fluid consistency: Thin  Diet effective now             EDUCATION NEEDS:   No education needs have been identified at this time  Skin:  Skin Assessment: Reviewed RN Assessment  Last BM:  9/25 - PTA  Height:  Ht Readings from Last 1 Encounters:  11/03/23 4' 11.02 (1.499 m)   Weight:  Wt Readings from Last 1 Encounters:  11/03/23 65.8 kg   Ideal Body Weight:  43.2 kg  BMI:  Body mass index is 29.27 kg/m.  Estimated Nutritional Needs:   Kcal:  1700-1900 kcals  Protein:  80-95g  Fluid:  1.7-1.9L/day  Blair Deaner MS, RD, LDN Registered Dietitian Clinical Nutrition RD Inpatient Contact Info in Amion

## 2023-11-03 NOTE — Interval H&P Note (Signed)
 History and Physical Interval Note:  11/03/2023 7:41 AM  Jacqueline Koch  has presented today for surgery, with the diagnosis of Right femoral neck fracture.  The various methods of treatment have been discussed with the patient and family. After consideration of risks, benefits and other options for treatment, the patient has consented to  Procedure(s): ARTHROPLASTY, HIP, TOTAL, ANTERIOR APPROACH VS POSSIBLE SCREW FIXATION (Right) as a surgical intervention.  The patient's history has been reviewed, patient examined, no change in status, stable for surgery.  I have reviewed the patient's chart and labs.  Questions were answered to the patient's satisfaction.     Dann Galicia P Breton Berns

## 2023-11-03 NOTE — Op Note (Signed)
 Orthopaedic Surgery Operative Note (CSN: [id number] ) Date of Surgery: 11/03/2023  Admit Date: 11/01/2023   Diagnoses: Pre-Op Diagnoses: Right displaced femoral neck fracture   Post-Op Diagnosis: Same  Procedures: CPT 27130-Right total hip arthroplasty for femoral neck fracture  Surgeons : Primary: Kendal Franky SQUIBB, MD  Assistant: Lauraine Moores, PA-C  Location: OR 5   Anesthesia: General   Antibiotics: Ancef 2g preop with 1 gm vancomycin powder placed topically   Tourniquet time: None  Estimated Blood Loss: 450 mL  Complications:* No complications entered in OR log *   Specimens: ID Type Source Tests Collected by Time Destination  1 : Right Femoral Head Tissue PATH Bone biopsy SURGICAL PATHOLOGY Kendal Franky SQUIBB, MD 11/03/2023 [phone]      Implants: Implant Name Type Inv. Item Serial No. Manufacturer Lot No. LRB No. Used Action  SHELL ACET G7 3H 48 SZC - [id number] Shell SHELL ACET G7 3H 48 SZC  [employer](ORTH,TRAU,BIO,SG) [id number] Right 1 Implanted  SCREW BONE 6.5X35 SELF TAP - [id number] Screw SCREW BONE 6.5X35 SELF TAP  [employer]IM[employer]ON(ORTH,TRAU,BIO,SG) [id number] Right 1 Implanted  LINER ACETAB NEUTRAL 7/C 32 - [id number] Liner LINER ACETAB NEUTRAL 7/C 32  [employer]ON(ORTH,TRAU,BIO,SG) [id number] Right 1 Implanted  STEM FEM CEMLS SZ 13 133D - [id number] Stem STEM FEM CEMLS SZ 13 133D  ZIMMER [employer]ON(ORTH,TRAU,BIO,SG) [id number] Right 1 Implanted  HEAD CERAMIC BIOLOX 32 TP1 -3 - [id number] Head HEAD CERAMIC BIOLOX 32 TP1 -3  ZIMMER [employer]ON(ORTH,TRAU,BIO,SG) [id number] Right 1 Implanted     Indications for Surgery: 28 year old female who has been dealing with right hip pain for the last 5 months.  She had undergone a corticosteroid injection and subsequent MRI approximately 2 weeks ago which showed significant T2 signal intensity in the femoral head with possible subchondral fracture.  She slipped and fell sustaining a displaced right femoral neck fracture.  I discussed risks and benefits  of surgical intervention including possible total hip arthroplasty versus percutaneous screw fixation.  I discussed the risks of pursuing total hip arthroplasty including bleeding, infection, hip dislocation, leg length discrepancy, nerve and blood vessel injury, DVT, even the possibility anesthetic complications.  In regards to the percutaneous screw fixation I discussed the risks of hardware failure avascular necrosis nonunion, nerve and blood vessel injury and DVT.  After full discussion the patient and her mother wish to pursue total hip arthroplasty.  Operative Findings: 1.  Right total hip arthroplasty through anterior approach using [employer] G7 acetabular system 48 mm shell size for liner size C 2.  G7 acetabular system vitamin E highly cross-linked polyethylene neutral liner for 32 mm size head, size C 3.  6.5 mm self-tapping 35 mm screw for acetabular supplemental fixation 4.  [employer] Taperloc micro reduced distal size 13 standard offset cementless stem 5.  [employer] Biolox modular ceramic head size 32 mm with a -3 neck  Procedure: The patient was identified in the preoperative holding area. Consent was confirmed with the patient and their family and all questions were answered. The operative extremity was marked after confirmation with the patient. she was then brought back to the operating room by our anesthesia colleagues.  She was placed under general anesthetic and carefully transferred over to the [facility] table.  Fluoroscopic imaging showed the unstable nature of her injury.  Traction was applied and the right hip was prepped and draped in usual sterile fashion.  A timeout was performed to verify the patient, the procedure, and the extremity.  Preoperative antibiotics were dosed.  A standard anterior approach to the hip was made and carried down through skin and subcutaneous tissue.  Identified the ASIS and stayed lateral to the ASIS with my TFL fascial incision I then entered  the interval between the TFL and sartorius.  I cauterized the crossing vessels in the incision and expose the anterior capsule of the hip.  I then performed a capsulotomy and release the capsule all the way down to the lesser trochanter.  I then performed a intercalary neck cut.  I then used a corkscrew to remove the femoral head.  I sent this for pathology with the previous history as noted above.  The size measured approximately 42 to 43 mm.  I then performed a soft tissue release around the periphery including the acetabular labrum and the pulvinar.  Using fluoroscopic imaging as a guide I then sequentially reamed from 41 mm up to 47 mm.  I got good peripheral fit with bleeding healthy cancellous bone.  I then proceeded to choose a 48 mm acetabular shell and I press-fit this in place.  I had good purchase but I felt with her bone quality that a supplemental 6.5 millimeter screw was appropriate.  I then drilled and placed a 35 mm 6.5 millimeter screw.  Excellent fixation was obtained and I placed a vitamin E E highly cross-linked polyethylene liner for the 32 mm size head.  Once I had the acetabulum done I turned my attention to the femur.  I delivered the femur through the wound to expose and broach the femur.  I performed a superior capsular release to deliver the femur appropriately.  I then used a canal finder to find the center of the canal.  I then sequentially broached starting with a size 4 and ending up with a size 11.  I had good rotational stability and proceeded to trial off this implant with a standard neck and a -3 head.  The leg lengths were relatively symmetric there was a little bit of laxity with shucking of the hip.  On fluoroscopic imaging it did appear that I had a good amount to go up on the stem size.  I made note of the position of the stem and proceeded to dislocate the hip and removed the trial implant.  I then was able to upsize to a size 13 femoral stem and trialed off of this again.   Leg lengths were relatively symmetric and I had good proximal fit with the size 13.  I removed the trial implants and proceeded to place my final standard offset size 13 stem.  The final implant sat very close to where the previous trial implant had been positioned and so I proceeded to place a -3, 32 mm size ceramic head.  The hip was reduced and final fluoroscopic imaging was obtained.  The incision was copiously irrigated.  A gram of vancomycin powder was placed into the incision.  Layered closure of #1 Ethibond, 0 Vicryl, 2-0 Monocryl and 3-0 Monocryl with Dermabond was used to close the skin.  Sterile dressings were applied.  The patient was then awoke from anesthesia and taken to the PACU in stable condition.  Post Op Plan/Instructions: The patient will be weightbearing as tolerated to the right lower extremity.  She will receive postoperative Ancef.  She will be placed on aspirin for DVT prophylaxis.  Will have her mobilize with physical and Occupational Therapy.  No hip precautions are needed.  I was present and performed the entire surgery.  Lauraine  McClung, PA-C did assist me throughout the case. An assistant was necessary given the difficulty in approach, maintenance of reduction and ability to instrument the fracture.   Franky Light, MD [facility]

## 2023-11-03 NOTE — Consult Note (Signed)
 Orthopaedic Trauma Service (OTS) Consult   Patient ID: Jacqueline Koch MRN: [id number] DOB/AGE: [birth year] 28 y.o.  Reason for Consult:Right displaced femoral neck fracture Referring Physician: Dr. Tharon Ada, MD [name]  HPI: Jacqueline Koch is an 28 y.o. female who is being seen in consultation at the request of Dr. Ada for evaluation of right displaced femoral neck fracture.  Patient has been having right hip pain since May.  She has been undergoing multiple treatments including a corticosteroid injection with the Ortho care provider earlier last month.  She had an MRI that was performed approximately 10 days ago which showed significant signal increase in her femoral head.  She slipped and split her legs at the restaurant causing a displaced femoral neck fracture.  Consideration was for surgical fixation versus total hip arthroplasty.  Due to the significant nature of her hip issues previously Dr. Ada felt that it be best managed by an orthopedic traumatologist and someone who can perform a total hip arthroplasty so I was brought in to discuss treatment options.  Patient is in the preop holding area her mother is at bedside.  She does have an active 18-year-old.  Past Medical History:  Diagnosis Date   Chlamydia infection 01/20/2013   Closed nondisplaced fracture of shaft of left clavicle 03/30/2020   Kidney stone    Nexplanon  insertion 08/22/2015   08/22/2015       12/02/18 removal & reinsertion     Nexplanon  removal 06/09/2014   Nondisplaced fracture of body of left scapula with routine healing 03/30/2020   Syncope     History reviewed. No pertinent surgical history.  Family History  Problem Relation Age of Onset   Asthma Brother    Arthritis Paternal Grandmother    Heart Problems Paternal Grandmother    Crohn's disease Maternal Grandmother     Social History:  reports that she has been smoking cigarettes and cigars. She has never used smokeless tobacco. She reports current  alcohol use. She reports that she does not use drugs.  Allergies:  Allergies  Allergen Reactions   Penicillins Rash    Pt states she is not sure, she has been allergic since she was younger     Medications:  No current facility-administered medications on file prior to encounter.   Current Outpatient Medications on File Prior to Encounter  Medication Sig Dispense Refill   etonogestrel  (NEXPLANON ) 68 MG IMPL implant 1 each by Subdermal route once.     ibuprofen  (ADVIL ) 200 MG tablet Take 400 mg by mouth every 6 (six) hours as needed for headache.       ROS: Constitutional: No fever or chills Vision: No changes in vision ENT: No difficulty swallowing CV: No chest pain Pulm: No SOB or wheezing GI: No nausea or vomiting GU: No urgency or inability to hold urine Skin: No poor wound healing Neurologic: No numbness or tingling Psychiatric: No depression or anxiety Heme: No bruising Allergic: No reaction to medications or food   Exam: Blood pressure 114/67, pulse 83, temperature 99 F (37.2 C), temperature source Oral, resp. rate 16, height 4' 11.02 (1.499 m), weight 65.8 kg, SpO2 97%. General: No acute distress Orientation: Awake alert and oriented x 3 Mood and Affect: Cooperative and pleasant Gait: Unable to assess due to her fracture Coordination and balance: Within normal limits  Right lower extremity is flexed and unable to extend it secondary to pain.  Their compartments are soft compressible.  She has active dorsiflexion plantarflexion of her foot ankle  she is warm well-perfused foot with 2+ PT pulses.  Left lower extremity: Skin without lesions. No tenderness to palpation. Full painless ROM, full strength in each muscle groups without evidence of instability.   Medical Decision Making: Data: Imaging: X-rays and CT scan and MRI have been reviewed.  CT scan shows a displaced femoral neck fracture.  X-rays are difficult to visualize due to her flexed nature of her hip.   Her MRI shows increased T2 signal intensity with no loss of spherical nature of the hip.  No obvious signs of avascular necrosis.  Labs:  Results for orders placed or performed during the hospital encounter of 11/01/23 (from the past 24 hours)  Surgical pcr screen     Status: Abnormal   Collection Time: 11/02/23  3:50 PM   Specimen: Nasal Mucosa; Nasal Swab  Result Value Ref Range   MRSA, PCR NEGATIVE NEGATIVE   Staphylococcus aureus POSITIVE (A) NEGATIVE  Type and screen [facility]     Status: None   Collection Time: 11/02/23  4:01 PM  Result Value Ref Range   ABO/RH(D) A NEG    Antibody Screen NEG    Sample Expiration      11/05/2023,2359 Performed at [facility], [address] [place], [place] [address]      Imaging or Labs ordered: None  Medical history and chart was reviewed and case discussed with medical provider.  Assessment/Plan: 28 year old female with a displaced femoral neck fracture in the setting of previous chronic right hip pain and significant signal intensity on MRI approximately 10 days ago.  Due to the previous hip pain as well as the radiographic changes on MRI I do feel that total hip replacement is a reasonable option.  I discussed risks of proceeding with closed reduction and screw fixation versus anterior total hip arthroplasty.  I discussed the risks of bleeding, infection, dislocation, leg length discrepancy, need for revision, DVT, with the total hip replacement I also discussed loss of fixation potential avascular necrosis, femoral head collapse, significant arthritis and need for revision surgery if we pursued screw fixation.  After full discussion the patient wishes to have a more reliable outcome.  After full discussion the patient and her mother wish to proceed with total hip arthroplasty.  Consent was obtained.  Franky MYRTIS Light, MD [facility] [phone] (office) [email or web address]

## 2023-11-03 NOTE — Progress Notes (Addendum)
 Transition of Care [facility]) - CAGE-AID Screening   Patient Details  Name: Jacqueline Koch MRN: [id number] Date of Birth: 08-02-1995   Darice [name], RN Trauma Response Nurse Phone Number: [phone] 11/03/2023, 5:48 PM    CAGE-AID Screening:    Have You Ever Felt You Ought to Cut Down on Your Drinking or Drug Use?: No Have People Annoyed You By Critizing Your Drinking Or Drug Use?: No Have You Felt Bad Or Guilty About Your Drinking Or Drug Use?: No Have You Ever Had a Drink or Used Drugs First Thing In The Morning to Steady Your Nerves or to Get Rid of a Hangover?: No CAGE-AID Score: 0  Substance Abuse Education Offered: (S) No (drinks socially- no services needed)

## 2023-11-03 NOTE — Progress Notes (Signed)
 PROGRESS NOTE    Jacqueline Koch  FMW:[id number] DOB: May 08, 1995 DOA: 11/01/2023 PCP: Pcp, No  Subjective: Pt seen and examined. Met with pt and pt's grandmother at bedside. Pt had right total hip arthroplasty today by Dr. Kendal. Pt is now post-op. Still sleepy but arousable. Pt and grand-mother confirmed that pt will be returning home with her mother. Awaiting PT consult.   Hospital Course: CC: right pain, fall HPI: Jacqueline Koch  is a 28 y.o. female, without significant past medical history, complains of chronic right hip pain, she is currently followed by sports medicine doctor at [facility] Dr. Lyle Brunet, ongoing pain since May, much improved after receiving greater trochanteric injection followed by a subsequent intra-articular hip injection on 7/1 and 7/15.  Have MRI of right hip 10/23/2023 was noted for significant edema in the right femoral head with suggestion of possible subchondral microfracture in the superior femoral head, (patient did not have much hip pain by then). - Patient presents to ED secondary to fall, and sudden onset of right hip pain, patient reports she was in [employer], where she slipped and a bottle of water, causing her to fall, she reports (like I did a split), she had significant right hip/inner thigh pain which prompted her to come to ED. -CT right hip was obtained, which was significant for right intertrochanteric fracture, ED discussed with local orthopedic Dr. Kurt who recommended referral to [facility], ED discussed with tabetic PA Ozell Purchase who recommended admission to [facility] and patient will be seen by Ortho care group regarding further surgical intervention.    Significant Events: Admitted 11/01/2023 for right hip pain. Dr. Onesimo on-call for [facility] ortho. Instructed hospitalist to transfer pt to [facility] for admission.   Admission Labs: WBC 8.5, HgB 11.4, plt 196 Na 138, K 3.9, CO2 of 20, BUN 11, Scr 0.48, glu 93 Vit D  36.89  Admission Imaging Studies: XR right femur Acute right femoral neck fracture with proximal displacement and external rotation of the distal fragment. CT right hip Acute subcapital fracture of the right femoral neck with associated impaction and superolateral displacement. Associated significant flexion at the hip. 2. No evidence of dislocation or significant hip arthropathy. 3. No evidence of periarticular  hematoma or other focal fluid collection.  Significant Labs:   Significant Imaging Studies:   Antibiotic Therapy: Anti-infectives (From admission, onward)    None       Procedures:   Consultants: orthopedics    Assessment and Plan: * Closed right hip fracture (HCC) 11/02/23 Discussed case with Dr. Georgina with ortho. Plans for Dr. Kendal to perform right total hip arthroplasty tomorrow. Pt may eat today. NPO after MN. Continue prn IV dilaudid  for pain.    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 Kendal 11/03/23 had surgery today.   DVT prophylaxis: SCDs Start: 11/03/23 1102 ASA    Code Status: Full Code Family Communication: discussed with pt and grand-mother at bedside Disposition Plan: return home Reason for continuing need for hospitalization: just had surgery today.  Objective: Vitals:   11/03/23 1030 11/03/23 1045 11/03/23 1053 11/03/23 1102  BP: 139/88 127/89 135/89 (!) 148/82  Pulse: 84 90 77 78  Resp: 17 13 16 17   Temp:   98.2 F (36.8 C) 98.2 F (36.8 C)  TempSrc:    Oral  SpO2: 98% 96% 95% 99%  Weight:      Height:        Intake/Output Summary (Last 24 hours)  at 11/03/2023 1536 Last data filed at 11/03/2023 1505 Gross per 24 hour  Intake 2078.74 ml  Output 650 ml  Net 1428.74 ml   Filed Weights   11/01/23 1005 11/03/23 0653  Weight: 65.8 kg 65.8 kg    Examination:  Physical Exam Vitals and nursing note reviewed.  Constitutional:      General: She is not in acute distress.    Appearance: She is not toxic-appearing.   HENT:     Head: Normocephalic and atraumatic.  Cardiovascular:     Rate and Rhythm: Normal rate and regular rhythm.  Pulmonary:     Effort: Pulmonary effort is normal.     Breath sounds: Normal breath sounds.  Abdominal:     General: Bowel sounds are normal.     Palpations: Abdomen is soft.  Skin:    Capillary Refill: Capillary refill takes less than 2 seconds.  Neurological:     Mental Status: She is alert and oriented to person, place, and time.     Data Reviewed: I have personally reviewed following labs and imaging studies  CBC: Recent Labs  Lab 11/01/23 1526  WBC 8.5  HGB 11.4*  HCT 34.2*  MCV 94.5  PLT 196   Basic Metabolic Panel: Recent Labs  Lab 11/01/23 1526  NA 138  K 3.9  CL 105  CO2 20*  GLUCOSE 93  BUN 11  CREATININE 0.48  CALCIUM 8.9   GFR: Estimated Creatinine Clearance: 86.3 mL/min (by C-G formula based on SCr of 0.48 mg/dL).  Recent Results (from the past 240 hours)  Surgical pcr screen     Status: Abnormal   Collection Time: 11/02/23  3:50 PM   Specimen: Nasal Mucosa; Nasal Swab  Result Value Ref Range Status   MRSA, PCR NEGATIVE NEGATIVE Final   Staphylococcus aureus POSITIVE (A) NEGATIVE Final    Comment: (NOTE) The Xpert SA Assay (FDA approved for NASAL specimens in patients 74 years of age and older), is one component of a comprehensive surveillance program. It is not intended to diagnose infection nor to guide or monitor treatment. Performed at [facility], [address], [place], [place] [address]     Radiology Studies: DG HIP PORT BURNIS ORN OR W/O PELVIS 1V RIGHT Result Date: 11/03/2023 CLINICAL DATA:  Fracture, postop. EXAM: DG HIP (WITH OR WITHOUT PELVIS) 1V PORT RIGHT COMPARISON:  Preoperative imaging. FINDINGS: Right hip arthroplasty in expected alignment. No periprosthetic lucency or fracture. Recent postsurgical change includes air and edema in the soft tissues. IMPRESSION: Right hip arthroplasty without immediate  postoperative complication. Electronically Signed   By: Andrea Gasman M.D.   On: 11/03/2023 14:08   DG HIP UNILAT WITH PELVIS 2-3 VIEWS RIGHT Result Date: 11/03/2023 CLINICAL DATA:  Elective surgery. EXAM: DG HIP (WITH OR WITHOUT PELVIS) 2-3V RIGHT COMPARISON:  Preoperative imaging FINDINGS: Ten fluoroscopic spot views of the pelvis and right hip obtained in the operating room. Sequential images during hip arthroplasty. Fluoroscopy time 21.9. Dose 1.9 mGy. IMPRESSION: Intraoperative fluoroscopy during right hip arthroplasty. Electronically Signed   By: Andrea Gasman M.D.   On: 11/03/2023 10:13   DG C-Arm 1-60 Min-No Report Result Date: 11/03/2023 Fluoroscopy was utilized by the requesting physician.  No radiographic interpretation.   DG C-Arm 1-60 Min-No Report Result Date: 11/03/2023 Fluoroscopy was utilized by the requesting physician.  No radiographic interpretation.    Scheduled Meds:  (feeding supplement) PROSource Plus  30 mL Oral Daily   acetaminophen   1,000 mg Oral Q6H   [  START ON 11/04/2023] aspirin  325 mg Oral Daily   celecoxib  200 mg Oral BID   Chlorhexidine Gluconate Cloth  6 each Topical Daily   docusate sodium   100 mg Oral BID   multivitamin with minerals  1 tablet Oral Daily   mupirocin ointment  1 Application Nasal BID   senna  1 tablet Oral BID   Continuous Infusions:  sodium chloride  40 mL/hr at 11/03/23 1505    ceFAZolin (ANCEF) IV       LOS: 2 days   Time spent: 55 minutes  Camellia Door, DO  [facility]  11/03/2023, 3:36 PM

## 2023-11-03 NOTE — Anesthesia Preprocedure Evaluation (Addendum)
 Anesthesia Evaluation  Patient identified by MRN, date of birth, ID band Patient awake    Reviewed: Allergy & Precautions, NPO status , Patient's Chart, lab work & pertinent test results, reviewed documented beta blocker date and time   History of Anesthesia Complications Negative for: history of anesthetic complications  Airway Mallampati: III  TM Distance: >3 FB   Mouth opening: Limited Mouth Opening  Dental no notable dental hx.    Pulmonary neg COPD, Current Smoker and Patient abstained from smoking.   breath sounds clear to auscultation       Cardiovascular (-) hypertension(-) angina  Rhythm:Regular Rate:Normal  IMPRESSIONS     1. Left ventricular ejection fraction, by visual estimation, is 60 to  65%. The left ventricle has normal function. There is no left ventricular  hypertrophy.   2. The left ventricle has no regional wall motion abnormalities.   3. Global right ventricle has normal systolic function.The right  ventricular size is normal. No increase in right ventricular wall  thickness.   4. Left atrial size was normal.   5. The tricuspid valve is normal in structure. Tricuspid valve  regurgitation is mild   6. Normal pulmonary artery systolic pressure.      Neuro/Psych neg Seizures    GI/Hepatic ,,,(+) neg Cirrhosis        Endo/Other    Renal/GU Renal disease     Musculoskeletal   Abdominal   Peds  Hematology  (+) Blood dyscrasia (hgb 11.4), anemia   Anesthesia Other Findings   Reproductive/Obstetrics                              Anesthesia Physical Anesthesia Plan  ASA: 2  Anesthesia Plan: General   Post-op Pain Management:    Induction: Intravenous  PONV Risk Score and Plan: 2 and Ondansetron   Airway Management Planned: Oral ETT  Additional Equipment:   Intra-op Plan:   Post-operative Plan:   Informed Consent: I have reviewed the patients History  and Physical, chart, labs and discussed the procedure including the risks, benefits and alternatives for the proposed anesthesia with the patient or authorized representative who has indicated his/her understanding and acceptance.     Dental advisory given  Plan Discussed with: CRNA  Anesthesia Plan Comments:          Anesthesia Quick Evaluation

## 2023-11-03 NOTE — Anesthesia Postprocedure Evaluation (Signed)
 Anesthesia Post Note  Patient: Jacqueline Koch  Procedure(s) Performed: ARTHROPLASTY, HIP, TOTAL, ANTERIOR APPROACH VS POSSIBLE SCREW FIXATION (Right: Hip)     Patient location during evaluation: PACU Anesthesia Type: General Level of consciousness: awake and alert Pain management: pain level controlled Vital Signs Assessment: post-procedure vital signs reviewed and stable Respiratory status: spontaneous breathing, nonlabored ventilation, respiratory function stable and patient connected to nasal cannula oxygen Cardiovascular status: blood pressure returned to baseline and stable Postop Assessment: no apparent nausea or vomiting Anesthetic complications: no   No notable events documented.  Last Vitals:  Vitals:   11/03/23 1053 11/03/23 1102  BP: 135/89 (!) 148/82  Pulse: 77 78  Resp: 16 17  Temp: 36.8 C 36.8 C  SpO2: 95% 99%    Last Pain:  Vitals:   11/03/23 1238  TempSrc:   PainSc: 7                  Lynwood MARLA Cornea

## 2023-11-03 NOTE — Plan of Care (Signed)
  Problem: Education: Goal: Knowledge of General Education information will improve Description: Including pain rating scale, medication(s)/side effects and non-pharmacologic comfort measures Outcome: Progressing   Problem: Clinical Measurements: Goal: Respiratory complications will improve Outcome: Progressing   Problem: Activity: Goal: Risk for activity intolerance will decrease Outcome: Progressing   Problem: Nutrition: Goal: Adequate nutrition will be maintained Outcome: Progressing   Problem: Safety: Goal: Ability to remain free from injury will improve Outcome: Progressing   Problem: Skin Integrity: Goal: Risk for impaired skin integrity will decrease Outcome: Progressing   

## 2023-11-03 NOTE — H&P (View-Only) (Signed)
 Orthopaedic Trauma Service (OTS) Consult   Patient ID: Jacqueline Koch MRN: [id number] DOB/AGE: [birth year] 27 y.o.  Reason for Consult:Right displaced femoral neck fracture Referring Physician: Dr. Tharon Ada, MD Jacqueline Koch  HPI: Jacqueline Koch is an 28 y.o. female who is being seen in consultation at the request of Dr. Ada for evaluation of right displaced femoral neck fracture.  Patient has been having right hip pain since May.  She has been undergoing multiple treatments including a corticosteroid injection with the Ortho care provider earlier last month.  She had an MRI that was performed approximately 10 days ago which showed significant signal increase in her femoral head.  She slipped and split her legs at the restaurant causing a displaced femoral neck fracture.  Consideration was for surgical fixation versus total hip arthroplasty.  Due to the significant nature of her hip issues previously Dr. Ada felt that it be best managed by an orthopedic traumatologist and someone who can perform a total hip arthroplasty so I was brought in to discuss treatment options.  Patient is in the preop holding area her mother is at bedside.  She does have an active 18-year-old.  Past Medical History:  Diagnosis Date   Chlamydia infection 01/20/2013   Closed nondisplaced fracture of shaft of left clavicle 03/30/2020   Kidney stone    Nexplanon  insertion 08/22/2015   08/22/2015       12/02/18 removal & reinsertion     Nexplanon  removal 06/09/2014   Nondisplaced fracture of body of left scapula with routine healing 03/30/2020   Syncope     History reviewed. No pertinent surgical history.  Family History  Problem Relation Age of Onset   Asthma Brother    Arthritis Paternal Grandmother    Heart Problems Paternal Grandmother    Crohn's disease Maternal Grandmother     Social History:  reports that she has been smoking cigarettes and cigars. She has never used smokeless tobacco. She reports current  alcohol use. She reports that she does not use drugs.  Allergies:  Allergies  Allergen Reactions   Penicillins Rash    Pt states she is not sure, she has been allergic since she was younger     Medications:  No current facility-administered medications on file prior to encounter.   Current Outpatient Medications on File Prior to Encounter  Medication Sig Dispense Refill   etonogestrel  (NEXPLANON ) 68 MG IMPL implant 1 each by Subdermal route once.     ibuprofen  (ADVIL ) 200 MG tablet Take 400 mg by mouth every 6 (six) hours as needed for headache.       ROS: Constitutional: No fever or chills Vision: No changes in vision ENT: No difficulty swallowing CV: No chest pain Pulm: No SOB or wheezing GI: No nausea or vomiting GU: No urgency or inability to hold urine Skin: No poor wound healing Neurologic: No numbness or tingling Psychiatric: No depression or anxiety Heme: No bruising Allergic: No reaction to medications or food   Exam: Blood pressure 114/67, pulse 83, temperature 99 F (37.2 C), temperature source Oral, resp. rate 16, height 4' 11.02 (1.499 m), weight 65.8 kg, SpO2 97%. General: No acute distress Orientation: Awake alert and oriented x 3 Mood and Affect: Cooperative and pleasant Gait: Unable to assess due to her fracture Coordination and balance: Within normal limits  Right lower extremity is flexed and unable to extend it secondary to pain.  Their compartments are soft compressible.  She has active dorsiflexion plantarflexion of her foot ankle  she is warm well-perfused foot with 2+ PT pulses.  Left lower extremity: Skin without lesions. No tenderness to palpation. Full painless ROM, full strength in each muscle groups without evidence of instability.   Medical Decision Making: Data: Imaging: X-rays and CT scan and MRI have been reviewed.  CT scan shows a displaced femoral neck fracture.  X-rays are difficult to visualize due to her flexed nature of her hip.   Her MRI shows increased T2 signal intensity with no loss of spherical nature of the hip.  No obvious signs of avascular necrosis.  Labs:  Results for orders placed or performed during the hospital encounter of 11/01/23 (from the past 24 hours)  Surgical pcr screen     Status: Abnormal   Collection Time: 11/02/23  3:50 PM   Specimen: Nasal Mucosa; Nasal Swab  Result Value Ref Range   MRSA, PCR NEGATIVE NEGATIVE   Staphylococcus aureus POSITIVE (A) NEGATIVE  Type and screen [facility]     Status: None   Collection Time: 11/02/23  4:01 PM  Result Value Ref Range   ABO/RH(D) A NEG    Antibody Screen NEG    Sample Expiration      11/05/2023,2359 Performed at [facility], [address] [place], [place] [address]      Imaging or Labs ordered: None  Medical history and chart was reviewed and case discussed with medical provider.  Assessment/Plan: 28 year old female with a displaced femoral neck fracture in the setting of previous chronic right hip pain and significant signal intensity on MRI approximately 10 days ago.  Due to the previous hip pain as well as the radiographic changes on MRI I do feel that total hip replacement is a reasonable option.  I discussed risks of proceeding with closed reduction and screw fixation versus anterior total hip arthroplasty.  I discussed the risks of bleeding, infection, dislocation, leg length discrepancy, need for revision, DVT, with the total hip replacement I also discussed loss of fixation potential avascular necrosis, femoral head collapse, significant arthritis and need for revision surgery if we pursued screw fixation.  After full discussion the patient wishes to have a more reliable outcome.  After full discussion the patient and her mother wish to proceed with total hip arthroplasty.  Consent was obtained.  Franky MYRTIS Light, MD [facility] [phone] (office) [email or web address]

## 2023-11-03 NOTE — Anesthesia Procedure Notes (Signed)
 Procedure Name: Intubation Date/Time: 11/03/2023 7:57 AM  Performed by: Delores Dus, CRNAPre-anesthesia Checklist: Patient identified, Emergency Drugs available, Suction available and Patient being monitored Patient Re-evaluated:Patient Re-evaluated prior to induction Oxygen Delivery Method: Circle system utilized Preoxygenation: Pre-oxygenation with 100% oxygen Induction Type: IV induction Ventilation: Mask ventilation without difficulty Laryngoscope Size: Miller and 2 Grade View: Grade II Tube type: Oral Tube size: 7.0 mm Number of attempts: 2 Airway Equipment and Method: Stylet and Oral airway Placement Confirmation: ETT inserted through vocal cords under direct vision, positive ETCO2 and breath sounds checked- equal and bilateral Secured at: 20 cm Tube secured with: Tape Dental Injury: Teeth and Oropharynx as per pre-operative assessment  Comments: Intubated second attempt by Dr. Keneth

## 2023-11-04 ENCOUNTER — Other Ambulatory Visit (HOSPITAL_COMMUNITY): Payer: Self-pay

## 2023-11-04 ENCOUNTER — Encounter (HOSPITAL_COMMUNITY): Payer: Self-pay | Admitting: Student

## 2023-11-04 DIAGNOSIS — D62 Acute posthemorrhagic anemia: Secondary | ICD-10-CM | POA: Insufficient documentation

## 2023-11-04 DIAGNOSIS — S72001A Fracture of unspecified part of neck of right femur, initial encounter for closed fracture: Secondary | ICD-10-CM | POA: Diagnosis not present

## 2023-11-04 DIAGNOSIS — Z96641 Presence of right artificial hip joint: Secondary | ICD-10-CM | POA: Diagnosis not present

## 2023-11-04 LAB — CBC
HCT: 24.1 % — ABNORMAL LOW (ref 36.0–46.0)
Hemoglobin: 8.3 g/dL — ABNORMAL LOW (ref 12.0–15.0)
MCH: 32.2 pg (ref 26.0–34.0)
MCHC: 34.4 g/dL (ref 30.0–36.0)
MCV: 93.4 fL (ref 80.0–100.0)
Platelets: 168 K/uL (ref 150–400)
RBC: 2.58 MIL/uL — ABNORMAL LOW (ref 3.87–5.11)
RDW: 12.2 % (ref 11.5–15.5)
WBC: 9.7 K/uL (ref 4.0–10.5)
nRBC: 0 % (ref 0.0–0.2)

## 2023-11-04 LAB — BASIC METABOLIC PANEL WITH GFR
Anion gap: 7 (ref 5–15)
BUN: 9 mg/dL (ref 6–20)
CO2: 24 mmol/L (ref 22–32)
Calcium: 8.6 mg/dL — ABNORMAL LOW (ref 8.9–10.3)
Chloride: 104 mmol/L (ref 98–111)
Creatinine, Ser: 0.67 mg/dL (ref 0.44–1.00)
GFR, Estimated: >60 mL/min (ref >60)
Glucose, Bld: 159 mg/dL — ABNORMAL HIGH (ref 70–99)
Potassium: 4 mmol/L (ref 3.5–5.1)
Sodium: 135 mmol/L (ref 135–145)

## 2023-11-04 MED ORDER — DIPHENHYDRAMINE HCL 25 MG PO CAPS: 25.0000 mg | ORAL_CAPSULE | Freq: Four times a day (QID) | ORAL | Status: DC | PRN

## 2023-11-04 MED ORDER — ACETAMINOPHEN 500 MG PO TABS: 1000.0000 mg | ORAL_TABLET | Freq: Four times a day (QID) | ORAL | Status: AC | PRN

## 2023-11-04 MED ORDER — MUPIROCIN 2 % EX OINT
1.0000 | TOPICAL_OINTMENT | Freq: Two times a day (BID) | CUTANEOUS | 0 refills | Status: AC
  Filled 2023-11-04: qty 22, 11d supply, fill #0
  Filled 2023-11-04: qty 66, 33d supply, fill #0

## 2023-11-04 MED ORDER — METHOCARBAMOL 500 MG PO TABS
500.0000 mg | ORAL_TABLET | Freq: Four times a day (QID) | ORAL | 0 refills | Status: AC | PRN
  Filled 2023-11-04: qty 30, 8d supply, fill #0

## 2023-11-04 MED ORDER — ASPIRIN 325 MG PO TABS
325.0000 mg | ORAL_TABLET | Freq: Every day | ORAL | 0 refills | Status: AC
  Filled 2023-11-04: qty 30, 30d supply, fill #0

## 2023-11-04 MED ORDER — DIPHENHYDRAMINE HCL 25 MG PO CAPS: 25.0000 mg | ORAL_CAPSULE | Freq: Four times a day (QID) | ORAL | Status: AC | PRN

## 2023-11-04 MED ORDER — DIPHENHYDRAMINE HCL 50 MG/ML IJ SOLN: 25.0000 mg | Freq: Four times a day (QID) | INTRAMUSCULAR | Status: DC | PRN

## 2023-11-04 MED ORDER — POLYSACCHARIDE IRON COMPLEX 150 MG PO CAPS
150.0000 mg | ORAL_CAPSULE | Freq: Every day | ORAL | 0 refills | Status: AC
  Filled 2023-11-04: qty 30, 30d supply, fill #0

## 2023-11-04 MED ORDER — OXYCODONE HCL 10 MG PO TABS
5.0000 mg | ORAL_TABLET | ORAL | 0 refills | Status: AC | PRN
  Filled 2023-11-04: qty 30, 5d supply, fill #0

## 2023-11-04 MED ORDER — CELECOXIB 200 MG PO CAPS
200.0000 mg | ORAL_CAPSULE | Freq: Two times a day (BID) | ORAL | 0 refills | Status: AC
  Filled 2023-11-04: qty 10, 5d supply, fill #0

## 2023-11-04 MED ORDER — HYDROMORPHONE HCL 1 MG/ML IJ SOLN: 0.5000 mg | INTRAMUSCULAR | Status: DC | PRN

## 2023-11-04 MED ORDER — CHLORHEXIDINE GLUCONATE 4 % EX SOLN
1.0000 | CUTANEOUS | 1 refills | Status: AC
  Filled 2023-11-04: qty 946, 30d supply, fill #0

## 2023-11-04 NOTE — TOC Transition Note (Signed)
 Transition of Care [facility]) - Discharge Note   Patient Details  Name: Jacqueline Koch MRN: [id number] Date of Birth: [birth year]  Transition of Care [facility]) CM/SW Contact:  Rosalva Jon Bloch, RN Phone Number: 11/04/2023, 3:12 PM   Clinical Narrative:    Patient will DC to: home Anticipated DC date: 11/04/2023 Family notified: yes Transport by: car  Per MD patient ready for DC today. RN, patient, patient's  mom notified of DC. DME referral made with [employer] [phone]) . Equipment will be delivered to d/c lounge. Pt without RX med concerns. Post hospital f/u noted on AVS. Outpatient PT referral made with [facility]. Pt without transportation issues.  RNCM will sign off for now as intervention is no longer needed. Please consult us  again if new needs arise.    Final next level of care: Home/Self Care     Patient Goals and CMS Choice            Discharge Placement                       Discharge Plan and Services Additional resources added to the After Visit Summary for                  DME Arranged: [facility], Bedside commode   Date DME Agency Contacted: 11/04/23 Time DME Agency Contacted: 1511 Representative spoke with at DME Agency: [place]            Social Drivers of Health (SDOH) Interventions SDOH Screenings   Food Insecurity: No Food Insecurity (11/02/2023)  Housing: Low Risk  (11/02/2023)  Transportation Needs: No Transportation Needs (11/02/2023)  Utilities: Not At Risk (11/02/2023)  Alcohol Screen: Low Risk  (12/14/2021)  Depression (PHQ2-9): Medium Risk (12/14/2021)  Financial Resource Strain: Low Risk  (12/14/2021)  Physical Activity: Insufficiently Active (12/14/2021)  Social Connections: Moderately Isolated (12/14/2021)  Stress: No Stress Concern Present (12/14/2021)  Tobacco Use: High Risk (11/03/2023)     Readmission Risk Interventions     No data to display

## 2023-11-04 NOTE — Progress Notes (Signed)
 Reviewed AVS, patient expressed understanding of medications, MD follow up reviewed.   Removed IV, Site clean, dry and intact.  See LDA for information on wounds at discharge. Patient states all belongings brought to the hospital at time of admission are accounted for and packed to take home.  Picked up medications from [facility] pharmacy. This nurse transported patient to Discharge lounge to wait for transportation home.

## 2023-11-04 NOTE — Progress Notes (Addendum)
 PROGRESS NOTE    Jacqueline Koch  FMW:[id number] DOB: 23-Feb-1995 DOA: 11/01/2023 PCP: Pcp, No  Subjective: Pt seen and examined. Grandmother at bedside. Pt already has been out of bed and to [facility]. Pt has been cleared to DC to home with PT. DME youth RW, tub bench and BSC have been ordered. Ortho has cleared pt for discharge.   Hospital Course: CC: right pain, fall HPI: Jacqueline Koch  is a 28 y.o. female, without significant past medical history, complains of chronic right hip pain, she is currently followed by sports medicine doctor at [facility] Dr. Lyle Brunet, ongoing pain since May, much improved after receiving greater trochanteric injection followed by a subsequent intra-articular hip injection on 7/1 and 7/15.  Have MRI of right hip 10/23/2023 was noted for significant edema in the right femoral head with suggestion of possible subchondral microfracture in the superior femoral head, (patient did not have much hip pain by then). - Patient presents to ED secondary to fall, and sudden onset of right hip pain, patient reports she was in [employer], where she slipped and a bottle of water, causing her to fall, she reports (like I did a split), she had significant right hip/inner thigh pain which prompted her to come to ED. -CT right hip was obtained, which was significant for right intertrochanteric fracture, ED discussed with local orthopedic Dr. Kurt who recommended referral to [facility], ED discussed with tabetic PA Ozell Purchase who recommended admission to [facility] and patient will be seen by Ortho care group regarding further surgical intervention.    Significant Events: Admitted 11/01/2023 for right hip pain. Dr. Onesimo on-call for [facility] ortho. Instructed hospitalist to transfer pt to [facility] for admission. 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 CT right hip Acute subcapital fracture of the right femoral neck with associated impaction and superolateral displacement. Associated significant flexion at the hip. 2. No evidence of dislocation or significant hip arthropathy. 3. No evidence of periarticular  hematoma or other focal fluid collection.  Significant Labs:   Significant Imaging Studies:   Antibiotic Therapy: Anti-infectives (From admission, onward)    None       Procedures: Left total hip arthroplasty  Consultants: orthopedics    Assessment and Plan: * Closed right hip fracture (HCC) 11/02/23 Discussed case with Dr. Georgina with ortho. Plans for Dr. Kendal to perform right total hip arthroplasty tomorrow. Pt may eat today. NPO after MN. Continue prn IV dilaudid  for pain.   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  11/04/23 cleared for DC by PT and ortho   Acute postoperative anemia due to expected blood loss 11/04/23 pre-op HgB 11.4, post-op HgB 8.3 g/dl. Will discharge to home with oral iron. Needs repeat CBC af ortho f/u appointment.  DVT prophylaxis: SCDs Start: 11/03/23 1102    Code Status: Full Code Family Communication: discussed with pt's grandmother at bedside Disposition Plan: home Reason for continuing need for hospitalization: stable for DC.  Objective: Vitals:   11/03/23 1950 11/04/23 0007 11/04/23 0452 11/04/23 0825  BP: 113/74 (!) 90/50 93/65 (!) 87/54  Pulse: 97 69 97 72  Resp: 15 18 16 16   Temp: 98.1 F (36.7 C) 98.5 F (36.9 C) 98.1 F (36.7 C) 97.8 F (36.6 C)  TempSrc: Oral  Oral   SpO2: 97% 99% 99% 100%  Weight:      Height:        Intake/Output Summary (Last 24 hours) at 11/04/2023 1058 Last data filed at 11/04/2023 0823 Gross per 24 hour  Intake  790.02 ml  Output 200 ml  Net 590.02 ml   Filed Weights   11/01/23 1005 11/03/23 0653  Weight: 65.8 kg 65.8 kg    Examination:  Physical Exam Vitals and nursing note reviewed.  Constitutional:      General: She is not in acute distress.    Appearance: She is not toxic-appearing.  HENT:     Head: Normocephalic and atraumatic.  Eyes:     General: No scleral icterus. Cardiovascular:     Rate and Rhythm: Normal rate and regular rhythm.  Pulmonary:     Effort: Pulmonary effort is normal.     Breath sounds: Normal breath sounds.  Abdominal:     General: Bowel sounds are normal.     Palpations: Abdomen is soft.  Skin:    General: Skin is warm and dry.     Capillary Refill: Capillary refill takes less than 2 seconds.  Neurological:     Mental Status: She is alert and oriented to person, place, and time.     Data Reviewed: I have personally reviewed following labs and imaging studies  CBC: Recent Labs  Lab 11/01/23 1526 11/02/23 0557 11/04/23 0311  WBC 8.5 6.5 9.7  HGB 11.4* 11.2* 8.3*  HCT 34.2* 33.0* 24.1*  MCV 94.5 94.0 93.4  PLT 196 199 168   Basic Metabolic Panel: Recent Labs  Lab 11/01/23 1526 11/02/23 0557 11/04/23 0311  NA 138 136 135  K 3.9 3.5 4.0  CL 105 102 104  CO2 20* 23 24  GLUCOSE 93 114* 159*  BUN 11 6 9   CREATININE 0.48 0.57 0.67  CALCIUM 8.9 8.9 8.6*   GFR: Estimated Creatinine Clearance: 86.3 mL/min (by C-G formula based on SCr of 0.67 mg/dL).  Recent Results (from the past 240 hours)  Surgical pcr screen     Status: Abnormal   Collection Time: 11/02/23  3:50 PM   Specimen: Nasal Mucosa; Nasal Swab  Result Value Ref Range Status   MRSA, PCR NEGATIVE NEGATIVE Final   Staphylococcus aureus POSITIVE (A) NEGATIVE Final    Comment: (NOTE) The Xpert SA Assay (FDA approved for NASAL specimens in patients 75 years of age and older), is one component of a comprehensive surveillance program. It is not intended to diagnose infection nor  to guide or monitor treatment. Performed at [facility], [address] [place], [place] [address]      Radiology Studies: DG HIP PORT BURNIS ORN OR W/O PELVIS 1V RIGHT Result Date: 11/03/2023 CLINICAL DATA:  Fracture, postop. EXAM: DG HIP (WITH OR WITHOUT PELVIS) 1V PORT RIGHT COMPARISON:  Preoperative imaging. FINDINGS: Right hip arthroplasty in expected alignment. No periprosthetic lucency or fracture. Recent postsurgical change includes air and edema in the soft tissues. IMPRESSION: Right hip arthroplasty without immediate postoperative complication. Electronically Signed   By: Andrea Gasman M.D.   On: 11/03/2023 14:08   DG HIP UNILAT WITH PELVIS 2-3 VIEWS RIGHT Result Date: 11/03/2023 CLINICAL DATA:  Elective surgery. EXAM: DG HIP (WITH OR WITHOUT PELVIS) 2-3V RIGHT COMPARISON:  Preoperative imaging FINDINGS: Ten fluoroscopic spot views  of the pelvis and right hip obtained in the operating room. Sequential images during hip arthroplasty. Fluoroscopy time 21.9. Dose 1.9 mGy. IMPRESSION: Intraoperative fluoroscopy during right hip arthroplasty. Electronically Signed   By: Andrea Gasman M.D.   On: 11/03/2023 10:13   DG C-Arm 1-60 Min-No Report Result Date: 11/03/2023 Fluoroscopy was utilized by the requesting physician.  No radiographic interpretation.   DG C-Arm 1-60 Min-No Report Result Date: 11/03/2023 Fluoroscopy was utilized by the requesting physician.  No radiographic interpretation.    Scheduled Meds:  (feeding supplement) PROSource Plus  30 mL Oral Daily   acetaminophen   1,000 mg Oral Q6H   aspirin  325 mg Oral Daily   celecoxib  200 mg Oral BID   Chlorhexidine Gluconate Cloth  6 each Topical Daily   docusate sodium   100 mg Oral BID   multivitamin with minerals  1 tablet Oral Daily   mupirocin ointment  1 Application Nasal BID   senna  1 tablet Oral BID   Continuous Infusions:  sodium chloride  Stopped (11/03/23 1817)    ceFAZolin (ANCEF) IV 2 g (11/04/23 0451)      LOS: 3 days   Time spent: 55 minutes  Camellia Door, DO  [facility]  11/04/2023, 10:58 AM

## 2023-11-04 NOTE — Discharge Instructions (Signed)
 Orthopaedic Trauma Service Discharge Instructions   General Discharge Instructions  WEIGHT BEARING STATUS: Weightbearing as tolerated right lower extremity  RANGE OF MOTION/ACTIVITY: Okay for hip and knee range of motion as tolerated.  Wound Care: Keep your surgical dressing in place until follow-up.  If dressing becomes soiled or saturated, please remove and gently clean incision with soap and water before applying a new dressing.  Showering may begin postoperative day #3 (Wednesday, 11/06/2023).  Your surgical dressing is okay to get wet.  DVT/PE prophylaxis: Aspirin 325 mg daily x 30 days  Diet: as you were eating previously.  Can use over the counter stool softeners and bowel preparations, such as Miralax , to help with bowel movements.  Narcotics can be constipating.  Be sure to drink plenty of fluids  PAIN MEDICATION USE AND EXPECTATIONS  You have likely been given narcotic medications to help control your pain.  After a traumatic event that results in an fracture (broken bone) with or without surgery, it is ok to use narcotic pain medications to help control one's pain.  We understand that everyone responds to pain differently and each individual patient will be evaluated on a regular basis for the continued need for narcotic medications. Ideally, narcotic medication use should last no more than 6-8 weeks (coinciding with fracture healing).   As a patient it is your responsibility as well to monitor narcotic medication use and report the amount and frequency you use these medications when you come to your office visit.   We would also advise that if you are using narcotic medications, you should take a dose prior to therapy to maximize you participation.  IF YOU ARE ON NARCOTIC MEDICATIONS IT IS NOT PERMISSIBLE TO OPERATE A MOTOR VEHICLE (MOTORCYCLE/CAR/TRUCK/MOPED) OR HEAVY MACHINERY DO NOT MIX NARCOTICS WITH OTHER CNS (CENTRAL NERVOUS SYSTEM) DEPRESSANTS SUCH AS  ALCOHOL  POST-OPERATIVE OPIOID TAPER INSTRUCTIONS: It is important to wean off of your opioid medication as soon as possible. If you do not need pain medication after your surgery it is ok to stop day one. Opioids include: Codeine, Hydrocodone (Norco, Vicodin), Oxycodone (Percocet, oxycontin ) and hydromorphone  amongst others.  Long term and even short term use of opiods can cause: Increased pain response Dependence Constipation Depression Respiratory depression And more.  Withdrawal symptoms can include Flu like symptoms Nausea, vomiting And more Techniques to manage these symptoms Hydrate well Eat regular healthy meals Stay active Use relaxation techniques(deep breathing, meditating, yoga) Do Not substitute Alcohol to help with tapering If you have been on opioids for less than two weeks and do not have pain than it is ok to stop all together.  Plan to wean off of opioids This plan should start within one week post op of your fracture surgery  Maintain the same interval or time between taking each dose and first decrease the dose.  Cut the total daily intake of opioids by one tablet each day Next start to increase the time between doses. The last dose that should be eliminated is the evening dose.    STOP SMOKING OR USING NICOTINE PRODUCTS!!!!  As discussed nicotine severely impairs your body's ability to heal surgical and traumatic wounds but also impairs bone healing.  Wounds and bone heal by forming microscopic blood vessels (angiogenesis) and nicotine is a vasoconstrictor (essentially, shrinks blood vessels).  Therefore, if vasoconstriction occurs to these microscopic blood vessels they essentially disappear and are unable to deliver necessary nutrients to the healing tissue.  This is one modifiable factor that you can  do to dramatically increase your chances of healing your injury.  (This means no smoking, no nicotine gum, patches, etc)  ICE AND ELEVATE INJURED/OPERATIVE  EXTREMITY  Using ice and elevating the injured extremity above your heart can help with swelling and pain control.  Icing in a pulsatile fashion, such as 20 minutes on and 20 minutes off, can be followed.    Do not place ice directly on skin. Make sure there is a barrier between to skin and the ice pack.    Using frozen items such as frozen peas works well as the conform nicely to the are that needs to be iced.  USE AN ACE WRAP OR TED HOSE FOR SWELLING CONTROL  In addition to icing and elevation, Ace wraps or TED hose are used to help limit and resolve swelling.  It is recommended to use Ace wraps or TED hose until you are informed to stop.    When using Ace Wraps start the wrapping distally (farthest away from the body) and wrap proximally (closer to the body)   Example: If you had surgery on your leg or thing and you do not have a splint on, start the ace wrap at the toes and work your way up to the thigh        If you had surgery on your upper extremity and do not have a splint on, start the ace wrap at your fingers and work your way up to the upper arm  CALL THE OFFICE FOR MEDICATION REFILLS OR WITH ANY QUESTIONS/CONCERNS: [phone]   VISIT OUR WEBSITE FOR ADDITIONAL INFORMATION: [email or web address]  Call office for the following: Temperature greater than 101F Persistent nausea and vomiting Severe uncontrolled pain Redness, tenderness, or signs of infection (pain, swelling, redness, odor or green/yellow discharge around the site) Difficulty breathing, headache or visual disturbances Hives Persistent dizziness or light-headedness Extreme fatigue Any other questions or concerns you may have after discharge  In an emergency, call 911 or go to an Emergency Department at a nearby hospital  OTHER HELPFUL INFORMATION  If you had a block, it will wear off between 8-24 hrs postop typically.  This is period when your pain may go from nearly zero to the pain you would have had postop without  the block.  This is an abrupt transition but nothing dangerous is happening.  You may take an extra dose of narcotic when this happens.  You should wean off your narcotic medicines as soon as you are able.  Most patients will be off or using minimal narcotics before their first postop appointment.   We suggest you use the pain medication the first night prior to going to bed, in order to ease any pain when the anesthesia wears off. You should avoid taking pain medications on an empty stomach as it will make you nauseous.  Do not drink alcoholic beverages or take illicit drugs when taking pain medications.  In most states it is against the law to drive while you are in a splint or sling.  And certainly against the law to drive while taking narcotics.  You may return to work/school in the next couple of days when you feel up to it.   Pain medication may make you constipated.  Below are a few solutions to try in this order: Decrease the amount of pain medication if you aren't having pain. Drink lots of decaffeinated fluids. Drink prune juice and/or each dried prunes  If the first 3 don't work start with additional  solutions Take Colace - an over-the-counter stool softener Take Senokot - an over-the-counter laxative Take Miralax  - a stronger over-the-counter laxative

## 2023-11-04 NOTE — Progress Notes (Addendum)
   11/04/23 1221  Vitals  Temp 98 F (36.7 C)  Temp Source Oral  BP 98/61  MAP (mmHg) 72  BP Location Left Arm  BP Method Automatic  Patient Position (if appropriate) Sitting  Pulse Rate 85  Pulse Rate Source Dinamap  Resp 18  Level of Consciousness  Level of Consciousness Alert  MEWS COLOR  MEWS Score Color Green  Oxygen Therapy  SpO2 100 %  O2 Device Room Air  Pain Assessment  Pain Scale 0-10  Pain Score 8  Pain Type Surgical pain  Pain Location Hip  Pain Orientation Right  Pain Descriptors / Indicators Aching  Pain Frequency Intermittent  Pain Onset Gradual  Pain Intervention(s) Medication (See eMAR)  MEWS Score  MEWS Temp 0  MEWS Systolic 1  MEWS Pulse 0  MEWS RR 0  MEWS LOC 0  MEWS Score 1    Pt in no acute distress at time of discharge. Denies lightheadedness and dizziness while lying and ambulating. MD informed of BP documented above. MD assessed pts previous BP while at outpatient appointments and this is around pts baseline.

## 2023-11-04 NOTE — Evaluation (Signed)
 Physical Therapy Evaluation Patient Details Name: Jacqueline Koch MRN: [id number] DOB: 1995-02-16 Today's Date: 11/04/2023  History of Present Illness  Jacqueline Koch  is a 28 y.o. female presents to ED secondary to fall, and sudden onset of right hip pain, patient reports she was in [place], where she slipped and a bottle of water, causing her to fall, she reports (like I did a split), she had significant right hip/inner thigh pain which prompted her to come to ED. CT right hip was obtained, which was significant for right intertrochanteric fracture. Pt is now s/p R THA on 9/28. Pt without significant past medical history.  Clinical Impression  Pt tolerated treatment well today. Pt today was able to ambulate in hallway with RW supervision and navigate stairs with CGA. PTA pt was fully independent. Anticipate that pt will progress well and recommend OPPT upon DC with a youth RW, tub bench, and BSC. PT will continue to follow.         If plan is discharge home, recommend the following: A little help with walking and/or transfers;A little help with bathing/dressing/bathroom;Supervision due to cognitive status;Assist for transportation;Assistance with cooking/housework   Can travel by Administrator walker (2 wheels);Other (comment);BSC/3in1 (Youth RW and Tub bench)  Recommendations for Other Services       Functional Status Assessment Patient has had a recent decline in their functional status and demonstrates the ability to make significant improvements in function in a reasonable and predictable amount of time.     Precautions / Restrictions Precautions Precautions: Fall Recall of Precautions/Restrictions: Intact Restrictions Weight Bearing Restrictions Per Provider Order: Yes RLE Weight Bearing Per Provider Order: Weight bearing as tolerated      Mobility  Bed Mobility               General bed mobility comments: Pt up in recliner     Transfers Overall transfer level: Needs assistance Equipment used: Rolling walker (2 wheels) Transfers: Sit to/from Stand Sit to Stand: Supervision           General transfer comment: Good hand placement    Ambulation/Gait Ambulation/Gait assistance: Supervision Gait Distance (Feet): 100 Feet Assistive device: Rolling walker (2 wheels) Gait Pattern/deviations: Antalgic, Decreased stride length, Step-to pattern Gait velocity: decreased     General Gait Details: Slowed step to pattern  Stairs Stairs: Yes Stairs assistance: Contact guard assist Stair Management: Two rails, Step to pattern, Forwards Number of Stairs: 2 General stair comments: Cues for proper sequencing.  Wheelchair Mobility     Tilt Bed    Modified Rankin (Stroke Patients Only)       Balance Overall balance assessment: No apparent balance deficits (not formally assessed)                                           Pertinent Vitals/Pain Pain Assessment Pain Assessment: 0-10 Pain Score: 6  Pain Location: R hip Pain Descriptors / Indicators: Sore Pain Intervention(s): Monitored during session, Limited activity within patient's tolerance, Premedicated before session    Home Living Family/patient expects to be discharged to:: Private residence Living Arrangements: Parent Available Help at Discharge: Family;Available PRN/intermittently Type of Home: Mobile home Home Access: Stairs to enter Entrance Stairs-Rails: Right;Left (Unsure if she can reach both.) Entrance Stairs-Number of Steps: 4-5   Home Layout: One level Home Equipment: None  Prior Function Prior Level of Function : Independent/Modified Independent;Working/employed;Driving             Mobility Comments: Ind ADLs Comments: Ind     Extremity/Trunk Assessment   Upper Extremity Assessment Upper Extremity Assessment: Overall WFL for tasks assessed    Lower Extremity Assessment Lower Extremity  Assessment: RLE deficits/detail RLE Deficits / Details: R THA    Cervical / Trunk Assessment Cervical / Trunk Assessment: Normal  Communication   Communication Communication: No apparent difficulties    Cognition Arousal: Alert Behavior During Therapy: WFL for tasks assessed/performed   PT - Cognitive impairments: No apparent impairments                         Following commands: Intact       Cueing Cueing Techniques: Verbal cues     General Comments General comments (skin integrity, edema, etc.): VSS    Exercises     Assessment/Plan    PT Assessment Patient needs continued PT services  PT Problem List Decreased strength;Decreased range of motion;Decreased activity tolerance;Decreased balance;Decreased mobility;Decreased coordination;Decreased knowledge of use of DME;Decreased safety awareness;Decreased knowledge of precautions       PT Treatment Interventions DME instruction;Gait training;Stair training;Functional mobility training;Therapeutic activities;Therapeutic exercise;Balance training;Neuromuscular re-education;Patient/family education    PT Goals (Current goals can be found in the Care Plan section)  Acute Rehab PT Goals Patient Stated Goal: to go home PT Goal Formulation: With patient Time For Goal Achievement: 11/18/23 Potential to Achieve Goals: Good    Frequency Min 3X/week     Co-evaluation               AM-PAC PT 6 Clicks Mobility  Outcome Measure Help needed turning from your back to your side while in a flat bed without using bedrails?: A Little Help needed moving from lying on your back to sitting on the side of a flat bed without using bedrails?: A Little Help needed moving to and from a bed to a chair (including a wheelchair)?: A Little Help needed standing up from a chair using your arms (e.g., wheelchair or bedside chair)?: A Little Help needed to walk in hospital room?: A Little Help needed climbing 3-5 steps with a  railing? : A Little 6 Click Score: 18    End of Session Equipment Utilized During Treatment: Gait belt Activity Tolerance: Patient tolerated treatment well Patient left: in chair;with call bell/phone within reach;with family/visitor present Nurse Communication: Mobility status PT Visit Diagnosis: Other abnormalities of gait and mobility (R26.89)    Time: 9081-9060 PT Time Calculation (min) (ACUTE ONLY): 21 min   Charges:   PT Evaluation $PT Eval Low Complexity: 1 Low   PT General Charges $$ ACUTE PT VISIT: 1 Visit         Sueellen NOVAK, PT, DPT Acute Rehab Services [phone]   Sueellen Buddle 11/04/2023, 9:57 AM

## 2023-11-04 NOTE — Plan of Care (Signed)
  Problem: Health Behavior/Discharge Planning: Goal: Ability to manage health-related needs will improve Outcome: Progressing   Problem: Activity: Goal: Risk for activity intolerance will decrease Outcome: Progressing   Problem: Nutrition: Goal: Adequate nutrition will be maintained Outcome: Progressing   Problem: Pain Managment: Goal: General experience of comfort will improve and/or be controlled Outcome: Progressing   Problem: Safety: Goal: Ability to remain free from injury will improve Outcome: Progressing

## 2023-11-04 NOTE — Progress Notes (Signed)
 Orthopaedic Trauma Progress Note  SUBJECTIVE: Patient doing fairly well this morning, currently sitting up in bedside chair eating breakfast..  Pain is controlled on current regimen.  Last received oxycodone  at about 5 AM.  Patient notes that after oxycodone  dose last night, she is noted some full body itching.  Denies any rash.  Patient does not remember having any reactions to narcotics in the past but is unsure if she is ever taking oxycodone .  We discussed switching to a different pain medication but patient would prefer to utilize Benadryl  for her itching.  If itching persists, we can change medication.  Patient denies any chest pain, SOB, N/V.  Notes some numbness over her right lateral thigh.  No other complaints.  Patient's grandmother at bedside Femoral head was sent to pathology intraoperatively yesterday, results pending.  OBJECTIVE:  Vitals:   11/04/23 0452 11/04/23 0825  BP: 93/65 (!) 87/54  Pulse: 97 72  Resp: 16 16  Temp: 98.1 F (36.7 C) 97.8 F (36.6 C)  SpO2: 99% 100%    Opiates Today (MME): Today's  total administered Morphine Milligram Equivalents: 22.5 Opiates Yesterday (MME): Yesterday's total administered Morphine Milligram Equivalents: 140  General: Sitting up in bedside chair, no acute distress.  Pleasant and cooperative Respiratory: No increased work of breathing.  Operative Extremity (right lower extremity): Dressing to the anterior hip is clean, dry, intact.  Some tenderness with palpation over the hip and throughout the proximal thigh as expected.  Less tender to the distal thigh, lower leg, ankle, foot.  Has some soreness through the medial calf but no areas of exquisite tenderness.  Ankle dorsiflexion and plantarflexion are intact. + EHL/FHL.  Compartments are soft compressible.  Decreased sensation along the lateral thigh through lateral femoral cutaneous nerve distribution.  Otherwise sensation intact to light touch throughout the remainder of the leg. + DP  pulse  IMAGING: Stable post op imaging right hip  LABS:  Results for orders placed or performed during the hospital encounter of 11/01/23 (from the past 24 hours)  CBC     Status: Abnormal   Collection Time: 11/04/23  3:11 AM  Result Value Ref Range   WBC 9.7 4.0 - 10.5 K/uL   RBC 2.58 (L) 3.87 - 5.11 MIL/uL   Hemoglobin 8.3 (L) 12.0 - 15.0 g/dL   HCT 75.8 (L) 63.9 - 53.9 %   MCV 93.4 80.0 - 100.0 fL   MCH 32.2 26.0 - 34.0 pg   MCHC 34.4 30.0 - 36.0 g/dL   RDW 87.7 88.4 - 84.4 %   Platelets 168 150 - 400 K/uL   nRBC 0.0 0.0 - 0.2 %  Basic metabolic panel     Status: Abnormal   Collection Time: 11/04/23  3:11 AM  Result Value Ref Range   Sodium 135 135 - 145 mmol/L   Potassium 4.0 3.5 - 5.1 mmol/L   Chloride 104 98 - 111 mmol/L   CO2 24 22 - 32 mmol/L   Glucose, Bld 159 (H) 70 - 99 mg/dL   BUN 9 6 - 20 mg/dL   Creatinine, Ser 9.32 0.44 - 1.00 mg/dL   Calcium 8.6 (L) 8.9 - 10.3 mg/dL   GFR, Estimated >39 >39 mL/min   Anion gap 7 5 - 15    ASSESSMENT: Jacqueline Koch is a 28 y.o. female, 1 Day Post-Op s/p fall Procedures: TOTAL HIP ARTHROPLASTY FOR RIGHT FEMORAL NECK FRACTURE, ANTERIOR APPROACH  CV/Blood loss: Acute blood loss anemia, Hgb 8.3 this AM. Hemodynamically stable  PLAN: Weightbearing: WBAT RLE ROM: Unrestricted ROM.  No hip precautions needed Incisional and dressing care: Dressings left intact until follow-up  Showering: Okay to shower starting postop day #3, Aquacel dressing may get wet Orthopedic device(s): None  Pain management:  1. Tylenol  1000 mg q 6 hours scheduled 2. Robaxin  500 mg q 6 hours PRN 3. Oxycodone  5-15 mg q 4 hours PRN 4. Dilaudid  0.5 mg q 3 hours PRN 5.  Celebrex 200 mg twice daily VTE prophylaxis: Aspirin, SCDs ID:  Ancef 2gm post op Foley/Lines:  No foley, KVO IVFs Impediments to Fracture Healing: Vitamin D  level 36, no supplementation needed Dispo: PT/OT evaluation today.  Continue to monitor CBC, would recommend at least one more lab  draw to confirm stable hemoglobin prior to discharge.  Okay for discharge from ortho standpoint once cleared by medicine team and therapies  D/C recommendations: - Oxycodone , Robaxin , Celebrex, Tylenol  for pain control - Aspirin 325 mg daily x 30 days for DVT prophylaxis - No need for Vit D supplementation  Follow - up plan: 2 weeks after d/c for wound check and repeat x-rays   Contact information:  Franky Light MD, Lauraine Moores PA-C. After hours and holidays please check [email or web address] for group call information for [facility]   Lauraine PATRIC Moores, PA-C [phone] (office) [email or web address]

## 2023-11-04 NOTE — Assessment & Plan Note (Addendum)
 11/04/23 pre-op HgB 11.4, post-op HgB 8.3 g/dl. Will discharge to home with oral iron. Needs repeat CBC af ortho f/u appointment.

## 2023-11-04 NOTE — Discharge Summary (Signed)
 [facility] Physician Discharge Summary   Patient name: Jacqueline Koch  Admit date:     11/01/2023  Discharge date: 11/04/2023  Attending Physician: SHERLON BRAYTON RAMAN [[id number]]  Discharge Physician: Camellia Door   PCP: Pcp, No  Admitted From: Home  Disposition:  Home  Recommendations for Outpatient Follow-up:  Follow up with PCP in 1-2 weeks Follow up with orthopedics as scheduled  Home Health:No Equipment/Devices: None    Discharge Condition:Stable CODE STATUS:FULL Diet recommendation: Regular Fluid Restriction: None  Hospital Summary: CC: right pain, fall HPI: Jacqueline Koch  is a 28 y.o. female, without significant past medical history, complains of chronic right hip pain, she is currently followed by sports medicine doctor at [facility] Dr. Lyle Brunet, ongoing pain since May, much improved after receiving greater trochanteric injection followed by a subsequent intra-articular hip injection on 7/1 and 7/15.  Have MRI of right hip 10/23/2023 was noted for significant edema in the right femoral head with suggestion of possible subchondral microfracture in the superior femoral head, (patient did not have much hip pain by then). - Patient presents to ED secondary to fall, and sudden onset of right hip pain, patient reports she was in [employer], where she slipped and a bottle of water, causing her to fall, she reports (like I did a split), she had significant right hip/inner thigh pain which prompted her to come to ED. -CT right hip was obtained, which was significant for right intertrochanteric fracture, ED discussed with local orthopedic Dr. Kurt who recommended referral to [facility], ED discussed with tabetic PA Ozell Purchase who recommended admission to [facility] and patient will be seen by Ortho care group regarding further surgical intervention.    Significant Events: Admitted 11/01/2023 for right hip pain. Dr. Onesimo on-call for [facility] ortho.  Instructed hospitalist to transfer pt to [facility] for admission. 11-03-2023 left total hip arthroplasty  Admission Labs: WBC 8.5, HgB 11.4, plt 196 Na 138, K 3.9, CO2 of 20, BUN 11, Scr 0.48, glu 93 Vit D 36.89  Admission Imaging Studies: XR right femur Acute right femoral neck fracture with proximal displacement and external rotation of the distal fragment. CT right hip Acute subcapital fracture of the right femoral neck with associated impaction and superolateral displacement. Associated significant flexion at the hip. 2. No evidence of dislocation or significant hip arthropathy. 3. No evidence of periarticular  hematoma or other focal fluid collection.  Significant Labs:   Significant Imaging Studies:   Antibiotic Therapy: Anti-infectives (From admission, onward)    None       Procedures: Left total hip arthroplasty  Consultants: orthopedics   Hospital Course by Problem: * Closed right hip fracture (HCC) 11/02/23 Discussed case with Dr. Georgina with ortho. Plans for Dr. Kendal to perform right total hip arthroplasty tomorrow. Pt may eat today. NPO after MN. Continue prn IV dilaudid  for pain.   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  11/04/23 cleared for DC by PT and ortho   Acute postoperative anemia due to expected blood loss 11/04/23 pre-op HgB 11.4, post-op HgB 8.3 g/dl. Will discharge to home with oral iron. Needs repeat CBC af ortho f/u appointment.    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   Acute postoperative anemia due to expected blood  loss   Discharge Instructions  Discharge Instructions     Call MD for:  difficulty breathing, headache or visual disturbances   Complete by: As directed    Call MD for:  extreme fatigue   Complete by: As directed     Call MD for:  hives   Complete by: As directed    Call MD for:  persistant dizziness or light-headedness   Complete by: As directed    Call MD for:  persistant nausea and vomiting   Complete by: As directed    Call MD for:  redness, tenderness, or signs of infection (pain, swelling, redness, odor or green/yellow discharge around incision site)   Complete by: As directed    Call MD for:  severe uncontrolled pain   Complete by: As directed    Call MD for:  temperature >100.4   Complete by: As directed    Diet general   Complete by: As directed    Discharge instructions   Complete by: As directed    1. Follow up with your primary care provider in 1-2 weeks following discharge from hospital. 2. Follow up with orthopedics as scheduled   Increase activity slowly   Complete by: As directed    Leave dressing on - Keep it clean, dry, and intact until clinic visit   Complete by: As directed       Allergies as of 11/04/2023       Reactions   Penicillins Rash   Pt states she is not sure, she has been allergic since she was younger. Patient tolerated Ancef with surgery        Medication List     STOP taking these medications    ibuprofen  200 MG tablet Commonly known as: ADVIL        TAKE these medications    acetaminophen  500 MG tablet Commonly known as: TYLENOL  Take 2 tablets (1,000 mg total) by mouth every 6 (six) hours as needed for mild pain (pain score 1-3), fever or headache.   aspirin 325 MG tablet Take 1 tablet (325 mg total) by mouth daily. Start taking on: November 05, 2023   celecoxib 200 MG capsule Commonly known as: CELEBREX Take 1 capsule (200 mg total) by mouth 2 (two) times daily for 5 days.   chlorhexidine 4 % external liquid Commonly known as: HIBICLENS Apply 15 mLs (1 Application total) topically as directed for 30 doses. Use as directed daily for 5 days every other week for 6 weeks.   diphenhydrAMINE  25 mg capsule Commonly known as:  BENADRYL  Take 1 capsule (25 mg total) by mouth every 6 (six) hours as needed for itching.   iron polysaccharides 150 MG capsule Commonly known as: NIFEREX Take 1 capsule (150 mg total) by mouth daily.   methocarbamol  500 MG tablet Commonly known as: ROBAXIN  Take 1 tablet (500 mg total) by mouth every 6 (six) hours as needed for muscle spasms.   mupirocin ointment 2 % Commonly known as: BACTROBAN Place 1 Application into the nose 2 (two) times daily for 60 doses. Use as directed 2 times daily for 5 days every other week for 6 weeks.   Nexplanon  68 MG Impl implant Generic drug: etonogestrel  1 each by Subdermal route once.   Oxycodone  HCl 10 MG Tabs Take 0.5-1 tablets (5-10 mg total) by mouth every 4 (four) hours as needed for moderate pain (pain score 4-6) or severe pain (pain score 7-10).  Durable Medical Equipment  (From admission, onward)           Start     Ordered   11/04/23 1054  For home use only DME Tub bench  Once        11/04/23 1053   11/04/23 1053  For home use only DME Walker youth  Once       Question:  Patient needs a walker to treat with the following condition  Answer:  Debility   11/04/23 1053   11/04/23 0913  For home use only DME Bedside commode  Once       Question:  Patient needs a bedside commode to treat with the following condition  Answer:  Debility   11/04/23 0912              Discharge Care Instructions  (From admission, onward)           Start     Ordered   11/04/23 0000  Leave dressing on - Keep it clean, dry, and intact until clinic visit        11/04/23 1424            Follow-up Information     Haddix, Franky SQUIBB, MD. Schedule an appointment as soon as possible for a visit in 2 week(s).   Specialty: Orthopedic Surgery Why: for wound check and repeat x-rays Contact information: [address] [place] [address] [phone]                Allergies  Allergen Reactions   Penicillins  Rash    Pt states she is not sure, she has been allergic since she was younger. Patient tolerated Ancef with surgery    Discharge Exam: Vitals:   11/04/23 0825 11/04/23 1221  BP: (!) 87/54 98/61  Pulse: 72 85  Resp: 16 18  Temp: 97.8 F (36.6 C) 98 F (36.7 C)  SpO2: 100% 100%    Physical Exam Vitals and nursing note reviewed.  Constitutional:      General: She is not in acute distress.    Appearance: She is not toxic-appearing.  HENT:     Head: Normocephalic and atraumatic.  Eyes:     General: No scleral icterus. Cardiovascular:     Rate and Rhythm: Normal rate and regular rhythm.  Pulmonary:     Effort: Pulmonary effort is normal.     Breath sounds: Normal breath sounds.  Abdominal:     General: Bowel sounds are normal.     Palpations: Abdomen is soft.  Skin:    General: Skin is warm and dry.     Capillary Refill: Capillary refill takes less than 2 seconds.  Neurological:     Mental Status: She is alert and oriented to person, place, and time.     The results of significant diagnostics from this hospitalization (including imaging, microbiology, ancillary and laboratory) are listed below for reference.    Microbiology: Recent Results (from the past 240 hours)  Surgical pcr screen     Status: Abnormal   Collection Time: 11/02/23  3:50 PM   Specimen: Nasal Mucosa; Nasal Swab  Result Value Ref Range Status   MRSA, PCR NEGATIVE NEGATIVE Final   Staphylococcus aureus POSITIVE (A) NEGATIVE Final    Comment: (NOTE) The Xpert SA Assay (FDA approved for NASAL specimens in patients 66 years of age and older), is one component of a comprehensive surveillance program. It is not intended to diagnose infection nor to guide or monitor treatment.  Performed at [facility], [address] [place], [place] [address]      Labs: Basic Metabolic Panel: Recent Labs  Lab 11/01/23 1526 11/02/23 0557 11/04/23 0311  NA 138 136 135  K 3.9 3.5 4.0  CL 105 102 104   CO2 20* 23 24  GLUCOSE 93 114* 159*  BUN 11 6 9   CREATININE 0.48 0.57 0.67  CALCIUM 8.9 8.9 8.6*   CBC: Recent Labs  Lab 11/01/23 1526 11/02/23 0557 11/04/23 0311  WBC 8.5 6.5 9.7  HGB 11.4* 11.2* 8.3*  HCT 34.2* 33.0* 24.1*  MCV 94.5 94.0 93.4  PLT 196 199 168   Sepsis Labs Recent Labs  Lab 11/01/23 1526 11/02/23 0557 11/04/23 0311  WBC 8.5 6.5 9.7    Procedures/Studies: DG HIP PORT UNILAT W OR W/O PELVIS 1V RIGHT Result Date: 11/03/2023 CLINICAL DATA:  Fracture, postop. EXAM: DG HIP (WITH OR WITHOUT PELVIS) 1V PORT RIGHT COMPARISON:  Preoperative imaging. FINDINGS: Right hip arthroplasty in expected alignment. No periprosthetic lucency or fracture. Recent postsurgical change includes air and edema in the soft tissues. IMPRESSION: Right hip arthroplasty without immediate postoperative complication. Electronically Signed   By: Andrea Gasman M.D.   On: 11/03/2023 14:08   DG HIP UNILAT WITH PELVIS 2-3 VIEWS RIGHT Result Date: 11/03/2023 CLINICAL DATA:  Elective surgery. EXAM: DG HIP (WITH OR WITHOUT PELVIS) 2-3V RIGHT COMPARISON:  Preoperative imaging FINDINGS: Ten fluoroscopic spot views of the pelvis and right hip obtained in the operating room. Sequential images during hip arthroplasty. Fluoroscopy time 21.9. Dose 1.9 mGy. IMPRESSION: Intraoperative fluoroscopy during right hip arthroplasty. Electronically Signed   By: Andrea Gasman M.D.   On: 11/03/2023 10:13   DG C-Arm 1-60 Min-No Report Result Date: 11/03/2023 Fluoroscopy was utilized by the requesting physician.  No radiographic interpretation.   DG C-Arm 1-60 Min-No Report Result Date: 11/03/2023 Fluoroscopy was utilized by the requesting physician.  No radiographic interpretation.   CT HIP RIGHT WO CONTRAST Result Date: 11/01/2023 CLINICAL DATA:  Hip pain for 3 months. Stress fracture suspected. No recent injury. Abnormal MRI. EXAM: CT OF THE RIGHT HIP WITHOUT CONTRAST TECHNIQUE: Multidetector CT imaging of the  right hip was performed according to the standard protocol. Multiplanar CT image reconstructions were also generated. RADIATION DOSE REDUCTION: This exam was performed according to the departmental dose-optimization program which includes automated exposure control, adjustment of the mA and/or kV according to patient size and/or use of iterative reconstruction technique. COMPARISON:  Radiographs 07/05/2023.  MR arthrogram 10/23/2023. FINDINGS: Of note, this examination was performed on 10/29/2023 and interpreted on 11/01/2023 after the patient presented with a new injury and right hip deformity. See separate CT of the right hip done 11/01/2023. Bones/Joint/Cartilage The bones appear adequately mineralized. There is no evidence of cortical or stress fracture of the proximal right femur to correspond with the recent MR findings. No evidence of dislocation or femoral head osteonecrosis. No significant right hip joint effusion. The right sacroiliac joint appears unremarkable. Ligaments Suboptimally assessed by CT. Muscles and Tendons Unremarkable.  No evidence of periarticular hematoma. Soft tissues The periarticular soft tissues appear unremarkable. No evidence of fluid collection, foreign body or soft tissue emphysema. The visualized internal pelvic contents are unremarkable. IMPRESSION: 1. No evidence of cortical or stress fracture of the proximal right femur to correspond with the recent MR findings. MR findings could be secondary to an occult fracture or transient osteoporosis of the hip. 2. No significant hip arthropathy. 3. No evidence of periarticular hematoma  or other acute soft tissue findings. See subsequent radiographs and CT performed 11/01/2023 after an additional fall and resulting hip deformity. Electronically Signed   By: Elsie Perone M.D.   On: 11/01/2023 14:50   CT Hip Right Wo Contrast Result Date: 11/01/2023 CLINICAL DATA:  Right hip pain status post fall this morning. Right hip deformity with  femoral neck fracture. EXAM: CT OF THE RIGHT HIP WITHOUT CONTRAST TECHNIQUE: Multidetector CT imaging of the right hip was performed according to the standard protocol. Multiplanar CT image reconstructions were also generated. RADIATION DOSE REDUCTION: This exam was performed according to the departmental dose-optimization program which includes automated exposure control, adjustment of the mA and/or kV according to patient size and/or use of iterative reconstruction technique. COMPARISON:  Same day radiographs of the hips and right femur. CT of the right hip 10/29/2023. FINDINGS: Bones/Joint/Cartilage Positioning limited by the patient's injury. The patient's right hip is flexed approximately 90 degrees and the examination was performed in a decubitus position. There is an acute subcapital fracture of the right femoral neck with associated impaction and superolateral displacement. This appears new compared with the recent hip CT. No evidence of dislocation, underlying significant hip arthropathy or femoral head osteonecrosis. There is a small right hip joint effusion. Ligaments Suboptimally assessed by CT. Muscles and Tendons Unremarkable. Soft tissues No evidence of periarticular hematoma or other focal fluid collection. No evidence of foreign body or soft tissue emphysema. The visualized internal pelvic contents appear unremarkable. IMPRESSION: 1. Acute subcapital fracture of the right femoral neck with associated impaction and superolateral displacement. Associated significant flexion at the hip. 2. No evidence of dislocation or significant hip arthropathy. 3. No evidence of periarticular hematoma or other focal fluid collection. Electronically Signed   By: Elsie Perone M.D.   On: 11/01/2023 14:42   DG Hip Unilat W or Wo Pelvis 2-3 Views Right Result Date: 11/01/2023 CLINICAL DATA:  Status post fall. EXAM: DG HIP (WITH OR WITHOUT PELVIS) 2-3V RIGHT COMPARISON:  None Available. FINDINGS: There is limited  patient positioning, secondary to inability of the patient to lower the right leg. A fracture deformity is suspected, extending through the neck and adjacent portion of the head of the proximal right femur with marked severity anterior angulation of the distal fracture site. There is no evidence of dislocation. There is no evidence of arthropathy or other focal bone abnormality. IMPRESSION: Suspected acute fracture of the proximal right femur. CT correlation is recommended. Electronically Signed   By: Suzen Dials M.D.   On: 11/01/2023 13:34   DG Knee 2 Views Right Result Date: 11/01/2023 EXAM: 1 OR 2 VIEW(S) XRAY OF THE RIGHT KNEE 11/01/2023 12:48:00 PM COMPARISON: None available. CLINICAL HISTORY: Fall, history of suspected subchondral microfracture of superior femoral head. Per triage: Patient at [employer] this morning, hit a slick spot and fell inside the store. Endorses no loss of consciousness, complains of pain with right leg/hip. No medical history. No allergies. Best images possible. Patient unable to lower right leg, patient had right knee against her chest during most of the exam. Tech held leg for multiple images. Patient stated she had a stress fracture a few months ago and was still in physical therapy. FINDINGS: BONES AND JOINTS: No acute fracture. No focal osseous lesion. No joint dislocation. No significant joint effusion. No significant degenerative changes. SOFT TISSUES: The soft tissues are unremarkable. IMPRESSION: 1. Normal knee radiographs. No acute osseous abnormality. Electronically signed by: Waddell Calk MD 11/01/2023 01:25 PM EDT RP Workstation:  [id number]   DG Femur Min 2 Views Right Result Date: 11/01/2023 EXAM: 2 VIEW(S) XRAY OF THE RIGHT FEMUR 11/01/2023 12:48:00 PM COMPARISON: None available. CLINICAL HISTORY: Fall, history of suspected subchondral microfracture of superior femoral head. Per triage: Patient at [employer] this am, hit a slick spot and fell inside the store.  Endorses no LOC, complains of pain with right leg/hip. No medical history. No allergies. Best images possible. Patient unable to lower right leg, patient had right knee against her chest during most of the exam. Tech held leg for multiple images. Patient stated she had a stress fracture a few months ago and was still in physical therapy. FINDINGS: BONES AND JOINTS: Acute right femoral neck fracture with proximal displacement and external rotation of the distal fracture fragment. No focal osseous lesion. No joint dislocation. SOFT TISSUES: The soft tissues are unremarkable. IMPRESSION: 1. Acute right femoral neck fracture with proximal displacement and external rotation of the distal fragment. Electronically signed by: Waddell Calk MD 11/01/2023 01:24 PM EDT RP Workstation: [id number]   MR Hip Right w/ contrast Result Date: 10/23/2023 MR HIP WITH IV CONTRAST COMPARISON: None. CLINICAL HISTORY: Right hip pain. Evaluate for labral abnormality. PULSE SEQUENCES: AX T1, Ax T2 FS, Cor T1, COR STIR & SMALL FOV COR PD FS following intra-articular placement of dilute gadolinium contrast. FINDINGS: Bones and labrum: There is extensive edema in the right femoral head with mild subchondral irregularity of the superior femoral head. Correlation for mild subchondral microfracture. Correlation with CT may be beneficial. Otherwise, the femoral neck and proximal femur are intact. There is no significant edema in the acetabulum. The pelvis, sacrum and SI joints are unremarkable. The labrum is intact. Musculotendinous structures: No significant tendinosis, myositis or bursitis is present. IMPRESSION: Significant edema in the right femoral head with suggestion of a possible subchondral microfracture in the superior femoral head. Correlation with CT may be beneficial. There is no evidence of an acetabular abnormality. No labral tear as questioned. Electronically signed by: Norleen Satchel MD 10/23/2023 04:30 PM EDT RP Workstation:  [id number]   ARCOLA ALVIA ANG HIP RIGHT Result Date: 10/23/2023 CLINICAL DATA:  28 year old female with right hip pain. Request for right hip arthrogram for MR. EXAM: EXAM RIGHT HIP INJECTION FOR MRI PROCEDURE: After a thorough discussion of risks and benefits of the procedure, written and oral informed consent was obtained. The consent discussion included off label use of Multihance, the risk of bleeding, infection and injury to nerves and adjacent blood vessels. Extra-articular injection was also a possible risk discussed. Preliminary localization was performed over the right hip. The area was marked over the junction of the left femoral head and neck. After prep and drape in the usual sterile fashion, the skin and deeper subcutaneous tissues were anesthetized with 1% Lidocaine  without Epinephrine. Under fluoroscopic guidance, a 22 gauge 3.5 inch spinal needle was advanced into the joint at the lateral margin of the junction of the femoral head and neck. Intra-articular injection of Lidocaine  was performed which flowed freely and subsequently the joint was distended with 10 ml of a 1:200 dilution of Multihance contrast. The MR arthrogram solution was as follows: 10 ml of omnipaque  300 contrast agent, 0.1 mL Multihance, 5 mL saline. An end point was felt as well as the patient experiencing pressure and the injection was discontinued, the needle removed, and a sterile dressing applied. The patient was taken to MRI for subsequent imaging. The patient tolerated the procedure well and there were no complications. FLUOROSCOPY: Radiation  Exposure Index (as provided by the fluoroscopic device): 9.0 mGy Kerma IMPRESSION: Successful right hip fluoroscopically guided injection. This exam was performed by Solmon Ku, PA-C and was supervised and interpreted by Ester Sides, MD. Electronically Signed   By: Ester Sides M.D.   On: 10/23/2023 15:03    Time coordinating discharge: 60 mins  SIGNED:  Camellia Door,  DO [facility] 11/04/23, 2:24 PM

## 2023-11-05 ENCOUNTER — Encounter (HOSPITAL_COMMUNITY): Payer: Self-pay

## 2023-11-05 LAB — SURGICAL PATHOLOGY

## 2023-11-05 NOTE — Therapy (Signed)
 [facility] at [facility] [address] [place], [place], [address] Phone: [phone]   Fax:  [phone]  Patient Details  Name: Jacqueline Koch MRN: [id number] Date of Birth: [birth year] Referring Provider:  No ref. provider found  Encounter Date: 11/05/2023 PHYSICAL THERAPY DISCHARGE SUMMARY  Visits from Start of Care: 14  Current functional level related to goals / functional outcomes: See last PT note   Remaining deficits: See last PT note   Education / Equipment: HEP   Patient agrees to discharge. Patient goals were partially met. Patient is being discharged due to a change in medical status. Patient fell last Friday and broke her hip.     [facility] at [facility] [address] [place], [place], [address] Phone: [phone]   Fax:  [phone]

## 2023-11-06 ENCOUNTER — Encounter (HOSPITAL_COMMUNITY)

## 2023-11-11 ENCOUNTER — Ambulatory Visit: Admitting: Sports Medicine

## 2023-11-12 ENCOUNTER — Encounter (HOSPITAL_COMMUNITY)

## 2023-11-13 NOTE — Therapy (Signed)
 OUTPATIENT PHYSICAL THERAPY LOWER EXTREMITY EVALUATION   Patient Name: Jacqueline Koch MRN: [id number] DOB:1995-09-13, 28 y.o., female Today's Date: 11/14/2023  END OF SESSION:  PT End of Session - 11/14/23 0728     Visit Number 1    Number of Visits 12    Date for Recertification  12/26/23    Authorization Type [place] Medicaid [facility]    Authorization Time Period seeking auth    PT Start Time 0728    PT Stop Time 0810    PT Time Calculation (min) 42 min    Equipment Utilized During Treatment Gait belt    Activity Tolerance Patient tolerated treatment well    Behavior During Therapy [facility] for tasks assessed/performed          Past Medical History:  Diagnosis Date   Chlamydia infection 01/20/2013   Closed nondisplaced fracture of shaft of left clavicle 03/30/2020   Kidney stone    Nexplanon  insertion 08/22/2015   08/22/2015       12/02/18 removal & reinsertion     Nexplanon  removal 06/09/2014   Nondisplaced fracture of body of left scapula with routine healing 03/30/2020   Syncope    Past Surgical History:  Procedure Laterality Date   TOTAL HIP ARTHROPLASTY Right 11/03/2023   Procedure: ARTHROPLASTY, HIP, TOTAL, ANTERIOR APPROACH VS POSSIBLE SCREW FIXATION;  Surgeon: Kendal Franky SQUIBB, MD;  Location: [facility] OR;  Service: Orthopedics;  Laterality: Right;   Patient Active Problem List   Diagnosis Date Noted   Acute postoperative anemia due to expected blood loss 11/04/2023   S/P total right hip arthroplasty - 11-04-2023 by Dr. Kendal 11/03/2023   Closed right hip fracture (HCC) 11/01/2023   Marijuana use 12/08/2014   Hearing deficit 12/01/2012   Scoliosis 11/02/2010    PCP: no PCP  REFERRING PROVIDER: Kendal Franky SQUIBB, MD  REFERRING DIAG: s/p Right THA  THERAPY DIAG:  S/P total right hip arthroplasty  Difficulty in walking, not elsewhere classified  Pain in right hip  Rationale for Evaluation and Treatment: Rehabilitation  ONSET DATE: 11/03/23  SUBJECTIVE:    SUBJECTIVE STATEMENT: Fell Friday 9/27 in [employer]; slipped in water; almost did a split; was unable to get up; went via ambulance to [facility] then to [facility]; surgery done 9/28 discharged home on Monday.  PERTINENT HISTORY: Here previously for therapy prior to fall PAIN:  Are you having pain? Yes: NPRS scale: 6/10 Pain location: right hip Pain description: sore/stiff Aggravating factors: just sitting prolonged Relieving factors: elevating right leg; ice packs; pain medication  PRECAUTIONS: Posterior hip    WEIGHT BEARING RESTRICTIONS: No  FALLS:  Has patient fallen in last 6 months? Yes. Number of falls 1   OCCUPATION:   PLOF: Independent  PATIENT GOALS: walk without the walker  NEXT MD VISIT: next Tuesday 10/14  OBJECTIVE:  Note: Objective measures were completed at Evaluation unless otherwise noted.  DIAGNOSTIC FINDINGS:   PATIENT SURVEYS:  LEFS  Extreme difficulty/unable (0), Quite a bit of difficulty (1), Moderate difficulty (2), Little difficulty (3), No difficulty (4) Survey date:    Any of your usual work, housework or school activities   2. Usual hobbies, recreational or sporting activities   3. Getting into/out of the bath   4. Walking between rooms   5. Putting on socks/shoes   6. Squatting    7. Lifting an object, like a bag of groceries from the floor   8. Performing light activities around your home   9. Performing heavy activities  around your home   10. Getting into/out of a car   11. Walking 2 blocks   12. Walking 1 mile   13. Going up/down 10 stairs (1 flight)   14. Standing for 1 hour   15.  sitting for 1 hour   16. Running on even ground   17. Running on uneven ground   18. Making sharp turns while running fast   19. Hopping    20. Rolling over in bed   Score total:  13/80; 16.3%     COGNITION: Overall cognitive status: Within functional limits for tasks assessed     SENSATION: WFL  EDEMA:  Yes right hip normal for this time  s/p  PALPATION: Swelling right hip  LOWER EXTREMITY ROM:  Active ROM Right eval Left eval  Hip flexion Not tested   Hip extension    Hip abduction    Hip adduction    Hip internal rotation    Hip external rotation    Knee flexion    Knee extension 3- 5  Ankle dorsiflexion 5 5  Ankle plantarflexion    Ankle inversion    Ankle eversion     (Blank rows = not tested)  LOWER EXTREMITY MMT:  MMT Right eval Left eval  Hip flexion    Hip extension    Hip abduction    Hip adduction    Hip internal rotation    Hip external rotation    Knee flexion    Knee extension    Ankle dorsiflexion    Ankle plantarflexion    Ankle inversion    Ankle eversion     (Blank rows = not tested)   FUNCTIONAL TESTS:  5 times sit to stand: 19.16 sec Timed up and go (TUG): 21.79 sec with RW  GAIT: Distance walked: 60 ft in clinic Assistive device utilized: Walker - 2 wheeled Level of assistance: SBA Comments: antalgic gait decreased stance right lower extremity                                                                                                                                TREATMENT DATE: 11/13/23 physical therapy evaluation and HEP instruction    PATIENT EDUCATION:  Education details: Patient educated on exam findings, POC, scope of PT, HEP, and what to expect next visit; review of hip precautions. Person educated: Patient Education method: Explanation, Demonstration, and Handouts Education comprehension: verbalized understanding, returned demonstration, verbal cues required, and tactile cues required   HOME EXERCISE PROGRAM: Access Code: [email or web address] URL: [email or web address] Date: 11/14/2023 Prepared by: [facility] - Rehab  Exercises - Seated Long Arc Quad  - 2 x daily - 7 x weekly - 2 sets - 10 reps - Seated Heel Toe Raises  - 2 x daily - 7 x weekly - 2 sets - 10 reps - Sit to Stand with Armchair  - 2 x daily - 7 x weekly - 1 sets - 5 reps -  Heel Raises  with Counter Support  - 2 x daily - 7 x weekly - 2 sets - 10 reps - Standing March with Counter Support  - 2 x daily - 7 x weekly - 2 sets - 10 reps - Standing Knee Flexion with Counter Support  - 2 x daily - 7 x weekly - 2 sets - 10 reps - Supine Quad Set  - 2 x daily - 7 x weekly - 1 sets - 10 reps - 5 sec hold - Hooklying Gluteal Sets  - 1 x daily - 7 x weekly - 1 sets - 10 reps - 5 sec hold  ASSESSMENT:  CLINICAL IMPRESSION: Patient is a 28 y.o. female who was seen today for physical therapy evaluation and treatment for s/p right THA after a fall and hip fracture; Patient demonstrates muscle weakness, reduced ROM, and fascial restrictions which are likely contributing to symptoms of pain and are negatively impacting patient ability to perform ADLs and functional mobility tasks. Patient will benefit from skilled physical therapy services to address these deficits to reduce pain and improve level of function with ADLs and functional mobility tasks.   OBJECTIVE IMPAIRMENTS: Abnormal gait, decreased activity tolerance, decreased balance, difficulty walking, decreased strength, increased edema, increased fascial restrictions, impaired perceived functional ability, and pain.   ACTIVITY LIMITATIONS: carrying, lifting, bending, sitting, standing, squatting, sleeping, stairs, transfers, bed mobility, bathing, toileting, dressing, and locomotion level  PARTICIPATION LIMITATIONS: meal prep, cleaning, laundry, driving, shopping, and community activity  REHAB POTENTIAL: Good  CLINICAL DECISION MAKING: Evolving/moderate complexity  EVALUATION COMPLEXITY: Moderate   GOALS: Goals reviewed with patient? No  SHORT TERM GOALS: Target date: 12/06/2023 patient will be independent with initial HEP  Baseline: Goal status: INITIAL  2.  Patient will report 50% improvement overall  Baseline:  Goal status: INITIAL  3.  Patient will be able to state 3/3 hip precautions independently; (no hip flexion  past 90, no crossing legs; no IR of hip) Baseline:  Goal status: INITIAL   LONG TERM GOALS: Target date: 12/26/2023  Patient will be independent in self management strategies to improve quality of life and functional outcomes.  Baseline:  Goal status: INITIAL  2.  Patient will report 70% improvement overall  Baseline:  Goal status: INITIAL  3.  Patient will improve LEFS score by 30  points to demonstrate improved perceived function  Baseline: 13/80 Goal status: INITIAL  4.  Patient will improve TUG score to 12 sec or less to demonstrate decreased fall risk and improved functional mobility Baseline: 21.79 sec Goal status: INITIAL  5.  Patient will improve 5 times sit to stand score to 12 sec or less to demonstrate improved functional mobility and increased leg strength.    Baseline: 19.16 sec Goal status: INITIAL    PLAN:  PT FREQUENCY: 2x/week  PT DURATION: 6 weeks  PLANNED INTERVENTIONS: 97164- PT Re-evaluation, 97110-Therapeutic exercises, 97530- Therapeutic activity, 97112- Neuromuscular re-education, 97535- Self Care, 02859- Manual therapy, [id number]- Gait training, [phone]- Orthotic Fit/training, [phone]- Canalith repositioning, [id number]- Aquatic Therapy, [phone]- Splinting, [phone]- Wound care (first 20 sq cm), 97598- Wound care (each additional 20 sq cm)Patient/Family education, Balance training, Stair training, Taping, Dry Needling, Joint mobilization, Joint manipulation, Spinal manipulation, Spinal mobilization, Scar mobilization, and DME instructions.   PLAN FOR NEXT SESSION: Review HEP and goals; posterior hip precautions; right leg strengthening; gait training, balance; stairs  8:14 AM, 11/14/23 Shalandria Elsbernd Small Reyana Leisey MPT [facility] [place] [phone] Ph:[phone]   Managed Medicaid Authorization Request Treatment  Start Date: 11/14/2023  Visit Dx Codes: R26.2, Z96.641, M25.551  Functional Tool Score: LEFS 13/80; 16.3%  For all possible CPT codes, reference  the Planned Interventions line above.     Check all conditions that are expected to impact treatment: {Conditions expected to impact treatment:None of these apply   If treatment provided at initial evaluation, no treatment charged due to lack of authorization.

## 2023-11-14 ENCOUNTER — Encounter (HOSPITAL_COMMUNITY): Admitting: Physical Therapy

## 2023-11-14 ENCOUNTER — Ambulatory Visit (HOSPITAL_COMMUNITY): Attending: Sports Medicine

## 2023-11-14 ENCOUNTER — Other Ambulatory Visit: Payer: Self-pay

## 2023-11-14 DIAGNOSIS — R262 Difficulty in walking, not elsewhere classified: Secondary | ICD-10-CM | POA: Insufficient documentation

## 2023-11-14 DIAGNOSIS — M25551 Pain in right hip: Secondary | ICD-10-CM | POA: Insufficient documentation

## 2023-11-14 DIAGNOSIS — Z96641 Presence of right artificial hip joint: Secondary | ICD-10-CM | POA: Insufficient documentation

## 2023-11-14 DIAGNOSIS — S72001A Fracture of unspecified part of neck of right femur, initial encounter for closed fracture: Secondary | ICD-10-CM | POA: Diagnosis not present

## 2023-11-19 ENCOUNTER — Ambulatory Visit (INDEPENDENT_AMBULATORY_CARE_PROVIDER_SITE_OTHER)

## 2023-11-19 DIAGNOSIS — M25551 Pain in right hip: Secondary | ICD-10-CM

## 2023-11-19 DIAGNOSIS — R262 Difficulty in walking, not elsewhere classified: Secondary | ICD-10-CM | POA: Diagnosis not present

## 2023-11-19 DIAGNOSIS — Z96641 Presence of right artificial hip joint: Secondary | ICD-10-CM

## 2023-11-19 DIAGNOSIS — S72001A Fracture of unspecified part of neck of right femur, initial encounter for closed fracture: Secondary | ICD-10-CM | POA: Diagnosis not present

## 2023-11-19 DIAGNOSIS — S72041D Displaced fracture of base of neck of right femur, subsequent encounter for closed fracture with routine healing: Secondary | ICD-10-CM | POA: Diagnosis not present

## 2023-11-19 DIAGNOSIS — M25561 Pain in right knee: Secondary | ICD-10-CM | POA: Diagnosis not present

## 2023-11-19 NOTE — Therapy (Signed)
 OUTPATIENT PHYSICAL THERAPY LOWER EXTREMITY EVALUATION   Patient Name: Jacqueline Koch MRN: [id number] DOB:03/18/1995, 28 y.o., female Today's Date: 11/19/2023  END OF SESSION:  PT End of Session - 11/19/23 0735     Visit Number 2    Number of Visits 12    Date for Recertification  12/26/23    Authorization Type [place] Medicaid [employer]    Authorization Time Period 12 visits from 10/10 to 02/12/23    Authorization - Visit Number 2    Authorization - Number of Visits 12    PT Start Time 0732    PT Stop Time 0812    PT Time Calculation (min) 40 min    Equipment Utilized During Treatment Gait belt    Activity Tolerance Patient tolerated treatment well    Behavior During Therapy [facility] for tasks assessed/performed          Past Medical History:  Diagnosis Date   Chlamydia infection 01/20/2013   Closed nondisplaced fracture of shaft of left clavicle 03/30/2020   Kidney stone    Nexplanon  insertion 08/22/2015   08/22/2015       12/02/18 removal & reinsertion     Nexplanon  removal 06/09/2014   Nondisplaced fracture of body of left scapula with routine healing 03/30/2020   Syncope    Past Surgical History:  Procedure Laterality Date   TOTAL HIP ARTHROPLASTY Right 11/03/2023   Procedure: ARTHROPLASTY, HIP, TOTAL, ANTERIOR APPROACH VS POSSIBLE SCREW FIXATION;  Surgeon: Kendal Franky SQUIBB, MD;  Location: [facility] OR;  Service: Orthopedics;  Laterality: Right;   Patient Active Problem List   Diagnosis Date Noted   Acute postoperative anemia due to expected blood loss 11/04/2023   S/P total right hip arthroplasty - 11-04-2023 by Dr. Kendal 11/03/2023   Closed right hip fracture (HCC) 11/01/2023   Marijuana use 12/08/2014   Hearing deficit 12/01/2012   Scoliosis 11/02/2010    PCP: no PCP  REFERRING PROVIDER: Kendal Franky SQUIBB, MD  REFERRING DIAG: s/p Right THA  THERAPY DIAG:  Difficulty in walking, not elsewhere classified  S/P total right hip arthroplasty  Pain in right  hip  Rationale for Evaluation and Treatment: Rehabilitation  ONSET DATE: 11/03/23  SUBJECTIVE:   SUBJECTIVE STATEMENT: 4/10 right knee pain today; hip is really just stiff.  Was able to get into her bed to sleep over the weekend   Eval:Fell Friday 9/27 in [employer]; slipped in water; almost did a split; was unable to get up; went via ambulance to [facility] then to [facility]; surgery done 9/28 discharged home on Monday.  PERTINENT HISTORY: Here previously for therapy prior to fall PAIN:  Are you having pain? Yes: NPRS scale: 6/10 Pain location: right hip Pain description: sore/stiff Aggravating factors: just sitting prolonged Relieving factors: elevating right leg; ice packs; pain medication  PRECAUTIONS: Posterior hip    WEIGHT BEARING RESTRICTIONS: No  FALLS:  Has patient fallen in last 6 months? Yes. Number of falls 1   OCCUPATION:   PLOF: Independent  PATIENT GOALS: walk without the walker  NEXT MD VISIT: next Tuesday 10/14  OBJECTIVE:  Note: Objective measures were completed at Evaluation unless otherwise noted.  DIAGNOSTIC FINDINGS:   PATIENT SURVEYS:  LEFS  Extreme difficulty/unable (0), Quite a bit of difficulty (1), Moderate difficulty (2), Little difficulty (3), No difficulty (4) Survey date:    Any of your usual work, housework or school activities   2. Usual hobbies, recreational or sporting activities   3. Getting into/out of the bath  4. Walking between rooms   5. Putting on socks/shoes   6. Squatting    7. Lifting an object, like a bag of groceries from the floor   8. Performing light activities around your home   9. Performing heavy activities around your home   10. Getting into/out of a car   11. Walking 2 blocks   12. Walking 1 mile   13. Going up/down 10 stairs (1 flight)   14. Standing for 1 hour   15.  sitting for 1 hour   16. Running on even ground   17. Running on uneven ground   18. Making sharp turns while running fast   19.  Hopping    20. Rolling over in bed   Score total:  13/80; 16.3%     COGNITION: Overall cognitive status: Within functional limits for tasks assessed     SENSATION: WFL  EDEMA:  Yes right hip normal for this time s/p  PALPATION: Swelling right hip  LOWER EXTREMITY ROM:  Active ROM Right eval Left eval  Hip flexion Not tested   Hip extension    Hip abduction    Hip adduction    Hip internal rotation    Hip external rotation    Knee flexion    Knee extension 3- 5  Ankle dorsiflexion 5 5  Ankle plantarflexion    Ankle inversion    Ankle eversion     (Blank rows = not tested)  LOWER EXTREMITY MMT:  MMT Right eval Left eval  Hip flexion    Hip extension    Hip abduction    Hip adduction    Hip internal rotation    Hip external rotation    Knee flexion    Knee extension    Ankle dorsiflexion    Ankle plantarflexion    Ankle inversion    Ankle eversion     (Blank rows = not tested)   FUNCTIONAL TESTS:  5 times sit to stand: 19.16 sec Timed up and go (TUG): 21.79 sec with RW  GAIT: Distance walked: 60 ft in clinic Assistive device utilized: Walker - 2 wheeled Level of assistance: SBA Comments: antalgic gait decreased stance right lower extremity                                                                                                                                TREATMENT DATE:  11/19/23 Review of HEP and goals Review of Hip precautions Standing  Heel raises 2 x 10 Slant board 3 x 20 Weight shifts side to side 2 x 10 Trial of marching; cannot put full weight on right leg to lift left so only right hip flexion 2 x 10 Sit to stand x 5 using hands Right knee flexion 2 x 10 Supine Quad set 5 hold x 10 Heel slides x 10 SAQ's x 10       11/13/23 physical therapy evaluation and HEP instruction  PATIENT EDUCATION:  Education details: Patient educated on exam findings, POC, scope of PT, HEP, and what to expect next visit; review of  hip precautions. Person educated: Patient Education method: Explanation, Demonstration, and Handouts Education comprehension: verbalized understanding, returned demonstration, verbal cues required, and tactile cues required   HOME EXERCISE PROGRAM: Access Code: [email or web address] URL: [email or web address] Date: 11/14/2023 Prepared by: AP - Rehab  Exercises - Seated Long Arc Quad  - 2 x daily - 7 x weekly - 2 sets - 10 reps - Seated Heel Toe Raises  - 2 x daily - 7 x weekly - 2 sets - 10 reps - Sit to Stand with Armchair  - 2 x daily - 7 x weekly - 1 sets - 5 reps - Heel Raises with Counter Support  - 2 x daily - 7 x weekly - 2 sets - 10 reps - Standing March with Counter Support  - 2 x daily - 7 x weekly - 2 sets - 10 reps - Standing Knee Flexion with Counter Support  - 2 x daily - 7 x weekly - 2 sets - 10 reps - Supine Quad Set  - 2 x daily - 7 x weekly - 1 sets - 10 reps - 5 sec hold - Hooklying Gluteal Sets  - 1 x daily - 7 x weekly - 1 sets - 10 reps - 5 sec hold  ASSESSMENT:  CLINICAL IMPRESSION: Today's session started with a review of HEP and goals. Able to state 2/3 hip precautions.  Patient verbalizes agreement with set rehab goals.  Patient unable to put full weight on right leg to lift her left. Added weight shifts to HEP and to treatment to encourage increased weight shift on the right leg.   Patient able to lie more comfortably in supine today.  Continues with significant swelling right hip and antalgic gait.  Patient will benefit from continued skilled therapy services during the remainder of her hospital stay and at the next recommended venue of care to address deficits and promote return to optimal function.       Eval:Patient is a 28 y.o. female who was seen today for physical therapy evaluation and treatment for s/p right THA after a fall and hip fracture; Patient demonstrates muscle weakness, reduced ROM, and fascial restrictions which are likely contributing to  symptoms of pain and are negatively impacting patient ability to perform ADLs and functional mobility tasks. Patient will benefit from skilled physical therapy services to address these deficits to reduce pain and improve level of function with ADLs and functional mobility tasks.   OBJECTIVE IMPAIRMENTS: Abnormal gait, decreased activity tolerance, decreased balance, difficulty walking, decreased strength, increased edema, increased fascial restrictions, impaired perceived functional ability, and pain.   ACTIVITY LIMITATIONS: carrying, lifting, bending, sitting, standing, squatting, sleeping, stairs, transfers, bed mobility, bathing, toileting, dressing, and locomotion level  PARTICIPATION LIMITATIONS: meal prep, cleaning, laundry, driving, shopping, and community activity  REHAB POTENTIAL: Good  CLINICAL DECISION MAKING: Evolving/moderate complexity  EVALUATION COMPLEXITY: Moderate   GOALS: Goals reviewed with patient? No  SHORT TERM GOALS: Target date: 12/06/2023 patient will be independent with initial HEP  Baseline: Goal status: in progress  2.  Patient will report 50% improvement overall  Baseline:  Goal status: in progress   3.  Patient will be able to state 3/3 hip precautions independently; (no hip flexion past 90, no crossing legs; no IR of hip) Baseline:  Goal status: in progress   LONG TERM GOALS: Target date: 12/26/2023  Patient  will be independent in self management strategies to improve quality of life and functional outcomes.  Baseline:  Goal status: in progress  2.  Patient will report 70% improvement overall  Baseline:  Goal status: in progress  3.  Patient will improve LEFS score by 30  points to demonstrate improved perceived function  Baseline: 13/80 Goal status: in progress  4.  Patient will improve TUG score to 12 sec or less to demonstrate decreased fall risk and improved functional mobility Baseline: 21.79 sec Goal status: in progress    5.  Patient will improve 5 times sit to stand score to 12 sec or less to demonstrate improved functional mobility and increased leg strength.    Baseline: 19.16 sec Goal status: in progress    PLAN:  PT FREQUENCY: 2x/week  PT DURATION: 6 weeks  PLANNED INTERVENTIONS: 97164- PT Re-evaluation, 97110-Therapeutic exercises, 97530- Therapeutic activity, 97112- Neuromuscular re-education, 97535- Self Care, 02859- Manual therapy, [id number]- Gait training, [phone]- Orthotic Fit/training, [phone]- Canalith repositioning, [id number]- Aquatic Therapy, [phone]- Splinting, [phone]- Wound care (first 20 sq cm), 97598- Wound care (each additional 20 sq cm)Patient/Family education, Balance training, Stair training, Taping, Dry Needling, Joint mobilization, Joint manipulation, Spinal manipulation, Spinal mobilization, Scar mobilization, and DME instructions.   PLAN FOR NEXT SESSION:  posterior hip precautions; right leg strengthening; gait training, balance; stairs  8:16 AM, 11/19/23 Amberli Ruegg Small Gonzalo Waymire MPT [facility] [place] [phone] Ph:[phone]

## 2023-11-21 ENCOUNTER — Ambulatory Visit (INDEPENDENT_AMBULATORY_CARE_PROVIDER_SITE_OTHER)

## 2023-11-21 ENCOUNTER — Encounter (HOSPITAL_COMMUNITY)

## 2023-11-21 ENCOUNTER — Encounter (HOSPITAL_COMMUNITY): Payer: Self-pay

## 2023-11-21 DIAGNOSIS — S72001A Fracture of unspecified part of neck of right femur, initial encounter for closed fracture: Secondary | ICD-10-CM | POA: Diagnosis not present

## 2023-11-21 DIAGNOSIS — M25551 Pain in right hip: Secondary | ICD-10-CM

## 2023-11-21 DIAGNOSIS — Z96641 Presence of right artificial hip joint: Secondary | ICD-10-CM | POA: Diagnosis not present

## 2023-11-21 DIAGNOSIS — R262 Difficulty in walking, not elsewhere classified: Secondary | ICD-10-CM | POA: Diagnosis not present

## 2023-11-21 NOTE — Therapy (Signed)
 OUTPATIENT PHYSICAL THERAPY LOWER EXTREMITY TREATMENT   Patient Name: Jacqueline Koch MRN: [id number] DOB:[birth year], 28 y.o., female Today's Date: 11/21/2023  END OF SESSION:  PT End of Session - 11/21/23 0816     Visit Number 3    Number of Visits 12    Date for Recertification  12/26/23    Authorization Type [place] [employer]    Authorization Time Period 12 visits from 10/10 to 02/12/23    Authorization - Visit Number 3    Authorization - Number of Visits 12    Progress Note Due on Visit 10    PT Start Time 0817    PT Stop Time 0900    PT Time Calculation (min) 43 min    Activity Tolerance Patient tolerated treatment well    Behavior During Therapy [facility] for tasks assessed/performed          Past Medical History:  Diagnosis Date   Chlamydia infection 01/20/2013   Closed nondisplaced fracture of shaft of left clavicle 03/30/2020   Kidney stone    Nexplanon  insertion 08/22/2015   08/22/2015       12/02/18 removal & reinsertion     Nexplanon  removal 06/09/2014   Nondisplaced fracture of body of left scapula with routine healing 03/30/2020   Syncope    Past Surgical History:  Procedure Laterality Date   TOTAL HIP ARTHROPLASTY Right 11/03/2023   Procedure: ARTHROPLASTY, HIP, TOTAL, ANTERIOR APPROACH VS POSSIBLE SCREW FIXATION;  Surgeon: Kendal Franky SQUIBB, MD;  Location: [facility] OR;  Service: Orthopedics;  Laterality: Right;   Patient Active Problem List   Diagnosis Date Noted   Acute postoperative anemia due to expected blood loss 11/04/2023   S/P total right hip arthroplasty - 11-04-2023 by Dr. Kendal 11/03/2023   Closed right hip fracture (HCC) 11/01/2023   Marijuana use 12/08/2014   Hearing deficit 12/01/2012   Scoliosis 11/02/2010    PCP: no PCP  REFERRING PROVIDER: Kendal Franky SQUIBB, MD  REFERRING DIAG: s/p Right THA  THERAPY DIAG:  Difficulty in walking, not elsewhere classified  S/P total right hip arthroplasty  Pain in right hip  Rationale for  Evaluation and Treatment: Rehabilitation  ONSET DATE: 11/03/23  SUBJECTIVE:   SUBJECTIVE STATEMENT: 6/10 right knee pain today; hip doesn't really hurt, just tight. Reporting doing exercises every day and walking a lot which is why she thinks knee is hurting. Reports things at home are getting easier to do.   Eval:Fell Friday 9/27 in [employer]; slipped in water; almost did a split; was unable to get up; went via ambulance to [facility] then to [facility]; surgery done 9/28 discharged home on Monday.  PERTINENT HISTORY: Here previously for therapy prior to fall PAIN:  Are you having pain? Yes: NPRS scale: 6/10 Pain location: right hip Pain description: sore/stiff Aggravating factors: just sitting prolonged Relieving factors: elevating right leg; ice packs; pain medication  PRECAUTIONS: Posterior hip    WEIGHT BEARING RESTRICTIONS: No  FALLS:  Has patient fallen in last 6 months? Yes. Number of falls 1   OCCUPATION:   PLOF: Independent  PATIENT GOALS: walk without the walker  NEXT MD VISIT: next Tuesday 10/14  OBJECTIVE:  Note: Objective measures were completed at Evaluation unless otherwise noted.  DIAGNOSTIC FINDINGS:   PATIENT SURVEYS:  LEFS  Extreme difficulty/unable (0), Quite a bit of difficulty (1), Moderate difficulty (2), Little difficulty (3), No difficulty (4) Survey date:    Any of your usual work, housework or school activities   2.  Usual hobbies, recreational or sporting activities   3. Getting into/out of the bath   4. Walking between rooms   5. Putting on socks/shoes   6. Squatting    7. Lifting an object, like a bag of groceries from the floor   8. Performing light activities around your home   9. Performing heavy activities around your home   10. Getting into/out of a car   11. Walking 2 blocks   12. Walking 1 mile   13. Going up/down 10 stairs (1 flight)   14. Standing for 1 hour   15.  sitting for 1 hour   16. Running on even ground   17.  Running on uneven ground   18. Making sharp turns while running fast   19. Hopping    20. Rolling over in bed   Score total:  13/80; 16.3%     COGNITION: Overall cognitive status: Within functional limits for tasks assessed     SENSATION: WFL  EDEMA:  Yes right hip normal for this time s/p  PALPATION: Swelling right hip  LOWER EXTREMITY ROM:  Active ROM Right eval Left eval  Hip flexion Not tested   Hip extension    Hip abduction    Hip adduction    Hip internal rotation    Hip external rotation    Knee flexion    Knee extension 3- 5  Ankle dorsiflexion 5 5  Ankle plantarflexion    Ankle inversion    Ankle eversion     (Blank rows = not tested)  LOWER EXTREMITY MMT:  MMT Right eval Left eval  Hip flexion    Hip extension    Hip abduction    Hip adduction    Hip internal rotation    Hip external rotation    Knee flexion    Knee extension    Ankle dorsiflexion    Ankle plantarflexion    Ankle inversion    Ankle eversion     (Blank rows = not tested)   FUNCTIONAL TESTS:  5 times sit to stand: 19.16 sec Timed up and go (TUG): 21.79 sec with RW  GAIT: Distance walked: 60 ft in clinic Assistive device utilized: Walker - 2 wheeled Level of assistance: SBA Comments: antalgic gait decreased stance right lower extremity                                                                                                                                TREATMENT DATE:  11/21/23: STS from chair height, 10x  10x with weight shift to RLE, to pt tolerance Heel raises, 2x10 Toe raises, 2x10 Seated hip adduction, 5 holds, 15x Seated hip abduction, RTB at knees, 5 holds, 15x Standing hip abduction, RTB at knees, 10x2 Seated LAQ, 3 lb. AW, 3 holds, 2x10 Standing hamstring curl on RLE, 3 lb. AW, 2x10 Tandem balance, 30x2 each LE leading   11/19/23 Review of HEP and goals Review  of Hip precautions Standing  Heel raises 2 x 10 Slant board 3 x  20 Weight shifts side to side 2 x 10 Trial of marching; cannot put full weight on right leg to lift left so only right hip flexion 2 x 10 Sit to stand x 5 using hands Right knee flexion 2 x 10 Supine Quad set 5 hold x 10 Heel slides x 10 SAQ's x 10   11/13/23 physical therapy evaluation and HEP instruction    PATIENT EDUCATION:  Education details: Patient educated on exam findings, POC, scope of PT, HEP, and what to expect next visit; review of hip precautions. Person educated: Patient Education method: Explanation, Demonstration, and Handouts Education comprehension: verbalized understanding, returned demonstration, verbal cues required, and tactile cues required   HOME EXERCISE PROGRAM: Access Code: [email or web address] URL: [email or web address] Date: 11/14/2023 Prepared by: [facility] - Rehab  Exercises - Seated Long Arc Quad  - 2 x daily - 7 x weekly - 2 sets - 10 reps - Seated Heel Toe Raises  - 2 x daily - 7 x weekly - 2 sets - 10 reps - Sit to Stand with Armchair  - 2 x daily - 7 x weekly - 1 sets - 5 reps - Heel Raises with Counter Support  - 2 x daily - 7 x weekly - 2 sets - 10 reps - Standing March with Counter Support  - 2 x daily - 7 x weekly - 2 sets - 10 reps - Standing Knee Flexion with Counter Support  - 2 x daily - 7 x weekly - 2 sets - 10 reps - Supine Quad Set  - 2 x daily - 7 x weekly - 1 sets - 10 reps - 5 sec hold - Hooklying Gluteal Sets  - 1 x daily - 7 x weekly - 1 sets - 10 reps - 5 sec hold  ASSESSMENT:  CLINICAL IMPRESSION: Pt arrives to session ambulating with RW. Reports she is having increased knee pain in R knee this date. Began session with general LE strengthening in standing position. Pt tolerated all well with hip, just limited due to increased knee pain. Followed with seated hip strengthening for comfort. Attempted side stepping, unable to complete this date due to discomfort in R knee with increased weight bearing. Addressed quad strength with  seated weighted LAQ as this may be reason for difficulty. Ended with balance exercises. Pt demonstrating good tandem balance when leading with RLE. More ankle strategy noted with LLE leading. Pt reports knee pain as 5/10 at end of session. Patient will benefit from continued skilled therapy services during the remainder of her hospital stay and at the next recommended venue of care to address deficits and promote return to optimal function.       Eval:Patient is a 28 y.o. female who was seen today for physical therapy evaluation and treatment for s/p right THA after a fall and hip fracture; Patient demonstrates muscle weakness, reduced ROM, and fascial restrictions which are likely contributing to symptoms of pain and are negatively impacting patient ability to perform ADLs and functional mobility tasks. Patient will benefit from skilled physical therapy services to address these deficits to reduce pain and improve level of function with ADLs and functional mobility tasks.   OBJECTIVE IMPAIRMENTS: Abnormal gait, decreased activity tolerance, decreased balance, difficulty walking, decreased strength, increased edema, increased fascial restrictions, impaired perceived functional ability, and pain.   ACTIVITY LIMITATIONS: carrying, lifting, bending, sitting, standing, squatting, sleeping, stairs, transfers, bed mobility, bathing, toileting,  dressing, and locomotion level  PARTICIPATION LIMITATIONS: meal prep, cleaning, laundry, driving, shopping, and community activity  REHAB POTENTIAL: Good  CLINICAL DECISION MAKING: Evolving/moderate complexity  EVALUATION COMPLEXITY: Moderate   GOALS: Goals reviewed with patient? No  SHORT TERM GOALS: Target date: 12/06/2023 patient will be independent with initial HEP  Baseline: Goal status: in progress  2.  Patient will report 50% improvement overall  Baseline:  Goal status: in progress   3.  Patient will be able to state 3/3 hip precautions  independently; (no hip flexion past 90, no crossing legs; no IR of hip) Baseline:  Goal status: in progress   LONG TERM GOALS: Target date: 12/26/2023  Patient will be independent in self management strategies to improve quality of life and functional outcomes.  Baseline:  Goal status: in progress  2.  Patient will report 70% improvement overall  Baseline:  Goal status: in progress  3.  Patient will improve LEFS score by 30  points to demonstrate improved perceived function  Baseline: 13/80 Goal status: in progress  4.  Patient will improve TUG score to 12 sec or less to demonstrate decreased fall risk and improved functional mobility Baseline: 21.79 sec Goal status: in progress   5.  Patient will improve 5 times sit to stand score to 12 sec or less to demonstrate improved functional mobility and increased leg strength.    Baseline: 19.16 sec Goal status: in progress    PLAN:  PT FREQUENCY: 2x/week  PT DURATION: 6 weeks  PLANNED INTERVENTIONS: 97164- PT Re-evaluation, 97110-Therapeutic exercises, 97530- Therapeutic activity, 97112- Neuromuscular re-education, 97535- Self Care, [id number]- Manual therapy, [id number]- Gait training, [phone]- Orthotic Fit/training, [phone]- Canalith repositioning, [id number]- Aquatic Therapy, [phone]- Splinting, [phone]- Wound care (first 20 sq cm), 97598- Wound care (each additional 20 sq cm)Patient/Family education, Balance training, Stair training, Taping, Dry Needling, Joint mobilization, Joint manipulation, Spinal manipulation, Spinal mobilization, Scar mobilization, and DME instructions.   PLAN FOR NEXT SESSION:  posterior hip precautions; right leg strengthening; gait training, balance; stairs    9:04 AM, 11/21/23 Rosaria Settler, PT, DPT [facility] [place]

## 2023-11-27 ENCOUNTER — Ambulatory Visit (HOSPITAL_COMMUNITY): Admitting: Physical Therapy

## 2023-11-27 DIAGNOSIS — Z96641 Presence of right artificial hip joint: Secondary | ICD-10-CM | POA: Diagnosis not present

## 2023-11-27 DIAGNOSIS — M25551 Pain in right hip: Secondary | ICD-10-CM

## 2023-11-27 DIAGNOSIS — S72001A Fracture of unspecified part of neck of right femur, initial encounter for closed fracture: Secondary | ICD-10-CM | POA: Diagnosis not present

## 2023-11-27 DIAGNOSIS — R262 Difficulty in walking, not elsewhere classified: Secondary | ICD-10-CM

## 2023-11-27 NOTE — Therapy (Signed)
 OUTPATIENT PHYSICAL THERAPY LOWER EXTREMITY TREATMENT   Patient Name: Jacqueline Koch MRN: [id number] DOB:06-13-1995, 28 y.o., female Today's Date: 11/27/2023  END OF SESSION:  PT End of Session - 11/27/23 1656     Visit Number 4    Number of Visits 12    Date for Recertification  12/26/23    Authorization Type [place] Medicaid Healthy Blue    Authorization Time Period 12 visits from 10/10 to 02/12/23    Authorization - Visit Number 4    Authorization - Number of Visits 12    Progress Note Due on Visit 10    PT Start Time 1500    PT Stop Time 1542    PT Time Calculation (min) 42 min    Activity Tolerance Patient tolerated treatment well    Behavior During Therapy [facility] for tasks assessed/performed           Past Medical History:  Diagnosis Date   Chlamydia infection 01/20/2013   Closed nondisplaced fracture of shaft of left clavicle 03/30/2020   Kidney stone    Nexplanon  insertion 08/22/2015   08/22/2015       12/02/18 removal & reinsertion     Nexplanon  removal 06/09/2014   Nondisplaced fracture of body of left scapula with routine healing 03/30/2020   Syncope    Past Surgical History:  Procedure Laterality Date   TOTAL HIP ARTHROPLASTY Right 11/03/2023   Procedure: ARTHROPLASTY, HIP, TOTAL, ANTERIOR APPROACH VS POSSIBLE SCREW FIXATION;  Surgeon: Kendal Franky SQUIBB, MD;  Location: [facility] OR;  Service: Orthopedics;  Laterality: Right;   Patient Active Problem List   Diagnosis Date Noted   Acute postoperative anemia due to expected blood loss 11/04/2023   S/P total right hip arthroplasty - 11-04-2023 by Dr. Kendal 11/03/2023   Closed right hip fracture (HCC) 11/01/2023   Marijuana use 12/08/2014   Hearing deficit 12/01/2012   Scoliosis 11/02/2010    PCP: no PCP  REFERRING PROVIDER: Kendal Franky SQUIBB, MD  REFERRING DIAG: s/p Right THA  THERAPY DIAG:  Difficulty in walking, not elsewhere classified  S/P total right hip arthroplasty  Pain in right hip  Rationale for  Evaluation and Treatment: Rehabilitation  ONSET DATE: 11/03/23  SUBJECTIVE:   SUBJECTIVE STATEMENT: 6/10 right knee pain today when weight bearing, none at rest.  Rt hip has no pain.  Reported HEP compliance.    Eval:Fell Friday 9/27 in [employer]; slipped in water; almost did a split; was unable to get up; went via ambulance to [facility] then to [facility]; surgery done 9/28 discharged home on Monday.  PERTINENT HISTORY: Here previously for therapy prior to fall PAIN:  Are you having pain? Yes: NPRS scale: 6/10 Pain location: right hip Pain description: sore/stiff Aggravating factors: just sitting prolonged Relieving factors: elevating right leg; ice packs; pain medication  PRECAUTIONS: Posterior hip    WEIGHT BEARING RESTRICTIONS: No  FALLS:  Has patient fallen in last 6 months? Yes. Number of falls 1   OCCUPATION:   PLOF: Independent  PATIENT GOALS: walk without the walker  NEXT MD VISIT: next Tuesday 10/14  OBJECTIVE:  Note: Objective measures were completed at Evaluation unless otherwise noted.  DIAGNOSTIC FINDINGS:   PATIENT SURVEYS:  LEFS  Extreme difficulty/unable (0), Quite a bit of difficulty (1), Moderate difficulty (2), Little difficulty (3), No difficulty (4) Survey date:    Any of your usual work, housework or school activities   2. Usual hobbies, recreational or sporting activities   3. Getting into/out of the bath  4. Walking between rooms   5. Putting on socks/shoes   6. Squatting    7. Lifting an object, like a bag of groceries from the floor   8. Performing light activities around your home   9. Performing heavy activities around your home   10. Getting into/out of a car   11. Walking 2 blocks   12. Walking 1 mile   13. Going up/down 10 stairs (1 flight)   14. Standing for 1 hour   15.  sitting for 1 hour   16. Running on even ground   17. Running on uneven ground   18. Making sharp turns while running fast   19. Hopping    20. Rolling  over in bed   Score total:  13/80; 16.3%     COGNITION: Overall cognitive status: Within functional limits for tasks assessed     SENSATION: WFL  EDEMA:  Yes right hip normal for this time s/p  PALPATION: Swelling right hip  LOWER EXTREMITY ROM:  Active ROM Right eval Left eval  Hip flexion    Hip extension    Hip abduction    Hip adduction    Hip internal rotation    Hip external rotation    Knee flexion    Knee extension    Ankle dorsiflexion    Ankle plantarflexion    Ankle inversion    Ankle eversion     (Blank rows = not tested)  LOWER EXTREMITY MMT:  MMT Right eval Left eval  Hip flexion N/t   Hip extension    Hip abduction    Hip adduction    Hip internal rotation    Hip external rotation    Knee flexion    Knee extension 3- 5  Ankle dorsiflexion 5 5  Ankle plantarflexion    Ankle inversion    Ankle eversion     (Blank rows = not tested)   FUNCTIONAL TESTS:  5 times sit to stand: 19.16 sec Timed up and go (TUG): 21.79 sec with RW  GAIT: Distance walked: 60 ft in clinic Assistive device utilized: Walker - 2 wheeled Level of assistance: SBA Comments: antalgic gait decreased stance right lower extremity                                                                                                                                TREATMENT DATE:  11/27/23: Standing:  with bil UE assist in // Heel raises, 2x10 Toe raises, 2x10 hip abduction, RTB at knees, 10x2 Forward lunges onto 4 step 2X10 each no UE assist to intermittent Tandem balance, 30x2 each LE leading Attempted vectors but unable to complete due to Rt knee pain High marching, alternating 10X each Seated LAQ 10X5 no weight, 4# 10X Sit to stands feet even, no UE 2X10  11/21/23: STS from chair height, 10x  10x with weight shift to RLE, to pt tolerance Heel raises, 2x10 Toe raises, 2x10  Seated hip adduction, 5 holds, 15x Seated hip abduction, RTB at knees, 5 holds,  15x Standing hip abduction, RTB at knees, 10x2 Seated LAQ, 3 lb. AW, 3 holds, 2x10 Standing hamstring curl on RLE, 3 lb. AW, 2x10 Tandem balance, 30x2 each LE leading   11/19/23 Review of HEP and goals Review of Hip precautions Standing  Heel raises 2 x 10 Slant board 3 x 20 Weight shifts side to side 2 x 10 Trial of marching; cannot put full weight on right leg to lift left so only right hip flexion 2 x 10 Sit to stand x 5 using hands Right knee flexion 2 x 10 Supine Quad set 5 hold x 10 Heel slides x 10 SAQ's x 10   11/13/23 physical therapy evaluation and HEP instruction    PATIENT EDUCATION:  Education details: Patient educated on exam findings, POC, scope of PT, HEP, and what to expect next visit; review of hip precautions. Person educated: Patient Education method: Explanation, Demonstration, and Handouts Education comprehension: verbalized understanding, returned demonstration, verbal cues required, and tactile cues required   HOME EXERCISE PROGRAM: Access Code: [email or web address] URL: [email or web address] Date: 11/14/2023 Prepared by: [facility] - Rehab  Exercises - Seated Long Arc Quad  - 2 x daily - 7 x weekly - 2 sets - 10 reps - Seated Heel Toe Raises  - 2 x daily - 7 x weekly - 2 sets - 10 reps - Sit to Stand with Armchair  - 2 x daily - 7 x weekly - 1 sets - 5 reps - Heel Raises with Counter Support  - 2 x daily - 7 x weekly - 2 sets - 10 reps - Standing March with Counter Support  - 2 x daily - 7 x weekly - 2 sets - 10 reps - Standing Knee Flexion with Counter Support  - 2 x daily - 7 x weekly - 2 sets - 10 reps - Supine Quad Set  - 2 x daily - 7 x weekly - 1 sets - 10 reps - 5 sec hold - Hooklying Gluteal Sets  - 1 x daily - 7 x weekly - 1 sets - 10 reps - 5 sec hold  ASSESSMENT:  CLINICAL IMPRESSION: Pt still having Rt knee pain that has increased after beginning therapy here in outpatient.  Pt unable to recall any event that would have increased  her pain or caused injury. Returns to [place] November and will address if still having issues.   Noted trembling of Rt LE with exercises today due to weakness, especially in quadriceps. Attempted to complete vectors, however unable to displace all weight into Rt LE due to increasing knee pain.   Worked on sit to stands, unable to control descent initially without bil UE but after completing 6 repetitions began completing without UE assist.   Rt knee pain is heeding progress as unable to progress more advanced WB'ing exercises.    lating with RW. Reports she is having increased knee pain in R knee this date. Began session with general LE strengthening in standing position. Pt tolerated all well with hip, just limited due to increased knee pain. Followed with seated hip strengthening for comfort. Attempted side stepping, unable to complete this date due to discomfort in R knee with increased weight bearing. Addressed quad strength with seated weighted LAQ as this may be reason for difficulty. Ended with balance exercises. Pt demonstrating good tandem balance when leading with RLE. More ankle strategy noted with LLE leading. Pt reports knee  pain as 5/10 at end of session. Patient will benefit from continued skilled therapy services during the remainder of her hospital stay and at the next recommended venue of care to address deficits and promote return to optimal function.       Eval:Patient is a 28 y.o. female who was seen today for physical therapy evaluation and treatment for s/p right THA after a fall and hip fracture; Patient demonstrates muscle weakness, reduced ROM, and fascial restrictions which are likely contributing to symptoms of pain and are negatively impacting patient ability to perform ADLs and functional mobility tasks. Patient will benefit from skilled physical therapy services to address these deficits to reduce pain and improve level of function with ADLs and functional mobility  tasks.   OBJECTIVE IMPAIRMENTS: Abnormal gait, decreased activity tolerance, decreased balance, difficulty walking, decreased strength, increased edema, increased fascial restrictions, impaired perceived functional ability, and pain.   ACTIVITY LIMITATIONS: carrying, lifting, bending, sitting, standing, squatting, sleeping, stairs, transfers, bed mobility, bathing, toileting, dressing, and locomotion level  PARTICIPATION LIMITATIONS: meal prep, cleaning, laundry, driving, shopping, and community activity  REHAB POTENTIAL: Good  CLINICAL DECISION MAKING: Evolving/moderate complexity  EVALUATION COMPLEXITY: Moderate   GOALS: Goals reviewed with patient? No  SHORT TERM GOALS: Target date: 12/06/2023 patient will be independent with initial HEP  Baseline: Goal status: in progress  2.  Patient will report 50% improvement overall  Baseline:  Goal status: in progress   3.  Patient will be able to state 3/3 hip precautions independently; (no hip flexion past 90, no crossing legs; no IR of hip) Baseline:  Goal status: in progress   LONG TERM GOALS: Target date: 12/26/2023  Patient will be independent in self management strategies to improve quality of life and functional outcomes.  Baseline:  Goal status: in progress  2.  Patient will report 70% improvement overall  Baseline:  Goal status: in progress  3.  Patient will improve LEFS score by 30  points to demonstrate improved perceived function  Baseline: 13/80 Goal status: in progress  4.  Patient will improve TUG score to 12 sec or less to demonstrate decreased fall risk and improved functional mobility Baseline: 21.79 sec Goal status: in progress   5.  Patient will improve 5 times sit to stand score to 12 sec or less to demonstrate improved functional mobility and increased leg strength.    Baseline: 19.16 sec Goal status: in progress    PLAN:  PT FREQUENCY: 2x/week  PT DURATION: 6 weeks  PLANNED  INTERVENTIONS: 97164- PT Re-evaluation, 97110-Therapeutic exercises, 97530- Therapeutic activity, 97112- Neuromuscular re-education, 97535- Self Care, 02859- Manual therapy, [id number]- Gait training, [phone]- Orthotic Fit/training, [phone]- Canalith repositioning, [id number]- Aquatic Therapy, [phone]- Splinting, [phone]- Wound care (first 20 sq cm), 97598- Wound care (each additional 20 sq cm)Patient/Family education, Balance training, Stair training, Taping, Dry Needling, Joint mobilization, Joint manipulation, Spinal manipulation, Spinal mobilization, Scar mobilization, and DME instructions.   PLAN FOR NEXT SESSION:  posterior hip precautions; right leg strengthening; gait training, balance; stairs    4:57 PM, 11/27/23 Greig KATHEE Fuse, PTA/CLT [facility] [place] Ph: [phone]

## 2023-11-29 ENCOUNTER — Encounter (HOSPITAL_COMMUNITY)

## 2023-12-03 ENCOUNTER — Ambulatory Visit (HOSPITAL_COMMUNITY)

## 2023-12-03 ENCOUNTER — Encounter (HOSPITAL_COMMUNITY): Payer: Self-pay

## 2023-12-03 DIAGNOSIS — R262 Difficulty in walking, not elsewhere classified: Secondary | ICD-10-CM

## 2023-12-03 DIAGNOSIS — M25551 Pain in right hip: Secondary | ICD-10-CM | POA: Diagnosis not present

## 2023-12-03 DIAGNOSIS — Z96641 Presence of right artificial hip joint: Secondary | ICD-10-CM

## 2023-12-03 DIAGNOSIS — S72001A Fracture of unspecified part of neck of right femur, initial encounter for closed fracture: Secondary | ICD-10-CM | POA: Diagnosis not present

## 2023-12-03 NOTE — Therapy (Signed)
 OUTPATIENT PHYSICAL THERAPY LOWER EXTREMITY TREATMENT   Patient Name: Jacqueline Koch MRN: [id number] DOB:23-May-1995, 28 y.o., female Today's Date: 12/03/2023  END OF SESSION:  PT End of Session - 12/03/23 1547     Visit Number 5    Number of Visits 12    Date for Recertification  12/26/23    Authorization Type [place] Medicaid Healthy Blue    Authorization Time Period 12 visits from 10/10 to 02/12/23    Authorization - Visit Number 5    Authorization - Number of Visits 12    Progress Note Due on Visit 10    PT Start Time 1547    PT Stop Time 1630    PT Time Calculation (min) 43 min    Activity Tolerance Patient tolerated treatment well    Behavior During Therapy [facility] for tasks assessed/performed           Past Medical History:  Diagnosis Date   Chlamydia infection 01/20/2013   Closed nondisplaced fracture of shaft of left clavicle 03/30/2020   Kidney stone    Nexplanon  insertion 08/22/2015   08/22/2015       12/02/18 removal & reinsertion     Nexplanon  removal 06/09/2014   Nondisplaced fracture of body of left scapula with routine healing 03/30/2020   Syncope    Past Surgical History:  Procedure Laterality Date   TOTAL HIP ARTHROPLASTY Right 11/03/2023   Procedure: ARTHROPLASTY, HIP, TOTAL, ANTERIOR APPROACH VS POSSIBLE SCREW FIXATION;  Surgeon: Kendal Franky SQUIBB, MD;  Location: [facility] OR;  Service: Orthopedics;  Laterality: Right;   Patient Active Problem List   Diagnosis Date Noted   Acute postoperative anemia due to expected blood loss 11/04/2023   S/P total right hip arthroplasty - 11-04-2023 by Dr. Kendal 11/03/2023   Closed right hip fracture (HCC) 11/01/2023   Marijuana use 12/08/2014   Hearing deficit 12/01/2012   Scoliosis 11/02/2010    PCP: no PCP  REFERRING PROVIDER: Kendal Franky SQUIBB, MD  REFERRING DIAG: s/p Right THA  THERAPY DIAG:  Difficulty in walking, not elsewhere classified  S/P total right hip arthroplasty  Pain in right hip  Rationale for  Evaluation and Treatment: Rehabilitation  ONSET DATE: 11/03/23  SUBJECTIVE:   SUBJECTIVE STATEMENT: Hasn't done that much today, no pain in hip and Rt knee is sore and achy today.     Eval:Fell Friday 9/27 in [employer]; slipped in water; almost did a split; was unable to get up; went via ambulance to [facility] then to [facility]; surgery done 9/28 discharged home on Monday.  PERTINENT HISTORY: Here previously for therapy prior to fall PAIN:  Are you having pain? Yes: NPRS scale: 3/10 Pain location: right hip Pain description: sore/stiff Aggravating factors: just sitting prolonged Relieving factors: elevating right leg; ice packs; pain medication  PRECAUTIONS: Posterior hip    WEIGHT BEARING RESTRICTIONS: No  FALLS:  Has patient fallen in last 6 months? Yes. Number of falls 1   OCCUPATION:   PLOF: Independent  PATIENT GOALS: walk without the walker  NEXT MD VISIT: next Tuesday 10/14  OBJECTIVE:  Note: Objective measures were completed at Evaluation unless otherwise noted.  DIAGNOSTIC FINDINGS:   PATIENT SURVEYS:  LEFS  Extreme difficulty/unable (0), Quite a bit of difficulty (1), Moderate difficulty (2), Little difficulty (3), No difficulty (4) Survey date:    Any of your usual work, housework or school activities   2. Usual hobbies, recreational or sporting activities   3. Getting into/out of the bath   4.  Walking between rooms   5. Putting on socks/shoes   6. Squatting    7. Lifting an object, like a bag of groceries from the floor   8. Performing light activities around your home   9. Performing heavy activities around your home   10. Getting into/out of a car   11. Walking 2 blocks   12. Walking 1 mile   13. Going up/down 10 stairs (1 flight)   14. Standing for 1 hour   15.  sitting for 1 hour   16. Running on even ground   17. Running on uneven ground   18. Making sharp turns while running fast   19. Hopping    20. Rolling over in bed   Score total:   13/80; 16.3%     COGNITION: Overall cognitive status: Within functional limits for tasks assessed     SENSATION: WFL  EDEMA:  Yes right hip normal for this time s/p  PALPATION: Swelling right hip  LOWER EXTREMITY ROM:  Active ROM Right eval Left eval  Hip flexion    Hip extension    Hip abduction    Hip adduction    Hip internal rotation    Hip external rotation    Knee flexion    Knee extension    Ankle dorsiflexion    Ankle plantarflexion    Ankle inversion    Ankle eversion     (Blank rows = not tested)  LOWER EXTREMITY MMT:  MMT Right eval Left eval  Hip flexion N/t   Hip extension    Hip abduction    Hip adduction    Hip internal rotation    Hip external rotation    Knee flexion    Knee extension 3- 5  Ankle dorsiflexion 5 5  Ankle plantarflexion    Ankle inversion    Ankle eversion     (Blank rows = not tested)   FUNCTIONAL TESTS:  5 times sit to stand: 19.16 sec Timed up and go (TUG): 21.79 sec with RW  GAIT: Distance walked: 60 ft in clinic Assistive device utilized: Walker - 2 wheeled Level of assistance: SBA Comments: antalgic gait decreased stance right lower extremity                                                                                                                                TREATMENT DATE:  12/03/23: Leg length measurements- equal Discussed equal weight bearing stance Rockerboard: Rt/Lt x 2 min; Df/PF x 2 min Toe tapping on 6in 20x  Bed mobility- encouraged to stop helping Rt LE up Supine- March to knee extended  Unable to complete SLR- complete with knee flexed to 60 degrees Sidelying:  Clam 10x 5 Sit to stand with increased Rt weight bearing elevated mat height to 20 in Standing reaching to Rt side to increased Rt LE weightbearing EOS pt able to lift LE onto mat  11/27/23: Standing:  with bil  UE assist in // Heel raises, 2x10 Toe raises, 2x10 hip abduction, RTB at knees, 10x2 Forward lunges onto 4  step 2X10 each no UE assist to intermittent Tandem balance, 30x2 each LE leading Attempted vectors but unable to complete due to Rt knee pain High marching, alternating 10X each Seated LAQ 10X5 no weight, 4# 10X Sit to stands feet even, no UE 2X10  11/21/23: STS from chair height, 10x  10x with weight shift to RLE, to pt tolerance Heel raises, 2x10 Toe raises, 2x10 Seated hip adduction, 5 holds, 15x Seated hip abduction, RTB at knees, 5 holds, 15x Standing hip abduction, RTB at knees, 10x2 Seated LAQ, 3 lb. AW, 3 holds, 2x10 Standing hamstring curl on RLE, 3 lb. AW, 2x10 Tandem balance, 30x2 each LE leading   11/19/23 Review of HEP and goals Review of Hip precautions Standing  Heel raises 2 x 10 Slant board 3 x 20 Weight shifts side to side 2 x 10 Trial of marching; cannot put full weight on right leg to lift left so only right hip flexion 2 x 10 Sit to stand x 5 using hands Right knee flexion 2 x 10 Supine Quad set 5 hold x 10 Heel slides x 10 SAQ's x 10   11/13/23 physical therapy evaluation and HEP instruction    PATIENT EDUCATION:  Education details: Patient educated on exam findings, POC, scope of PT, HEP, and what to expect next visit; review of hip precautions. Person educated: Patient Education method: Explanation, Demonstration, and Handouts Education comprehension: verbalized understanding, returned demonstration, verbal cues required, and tactile cues required   HOME EXERCISE PROGRAM: Access Code: [email or web address] URL: [email or web address] Date: 11/14/2023 Prepared by: [facility] - Rehab  Exercises - Seated Long Arc Quad  - 2 x daily - 7 x weekly - 2 sets - 10 reps - Seated Heel Toe Raises  - 2 x daily - 7 x weekly - 2 sets - 10 reps - Sit to Stand with Armchair  - 2 x daily - 7 x weekly - 1 sets - 5 reps - Heel Raises with Counter Support  - 2 x daily - 7 x weekly - 2 sets - 10 reps - Standing March with Counter Support  - 2 x daily - 7 x  weekly - 2 sets - 10 reps - Standing Knee Flexion with Counter Support  - 2 x daily - 7 x weekly - 2 sets - 10 reps - Supine Quad Set  - 2 x daily - 7 x weekly - 1 sets - 10 reps - 5 sec hold - Hooklying Gluteal Sets  - 1 x daily - 7 x weekly - 1 sets - 10 reps - 5 sec hold  ASSESSMENT:  CLINICAL IMPRESSION: Session focus on improving Rt LE weight bearing with gait mechanics and proximal strengthening.  Pt arrived ambulating with Rt LE circumduction, therapist assessed leg length discrepancy with equal length.  Pt presents with significant hip weakness.  Encouraged to use Rt LE more with bed mobility vs UE assistance.  Pt unable to complete SLR due to weakness.  Discussed current HEP with additional SLR, sidelying clam and standing reaching activities, with printout given.  Assessed Rt knee pain through session with no increased pain.  EOS pt limited by fatigue.  Eval:Patient is a 28 y.o. female who was seen today for physical therapy evaluation and treatment for s/p right THA after a fall and hip fracture; Patient demonstrates muscle weakness, reduced ROM, and fascial restrictions which are likely  contributing to symptoms of pain and are negatively impacting patient ability to perform ADLs and functional mobility tasks. Patient will benefit from skilled physical therapy services to address these deficits to reduce pain and improve level of function with ADLs and functional mobility tasks.   OBJECTIVE IMPAIRMENTS: Abnormal gait, decreased activity tolerance, decreased balance, difficulty walking, decreased strength, increased edema, increased fascial restrictions, impaired perceived functional ability, and pain.   ACTIVITY LIMITATIONS: carrying, lifting, bending, sitting, standing, squatting, sleeping, stairs, transfers, bed mobility, bathing, toileting, dressing, and locomotion level  PARTICIPATION LIMITATIONS: meal prep, cleaning, laundry, driving, shopping, and community activity  REHAB  POTENTIAL: Good  CLINICAL DECISION MAKING: Evolving/moderate complexity  EVALUATION COMPLEXITY: Moderate   GOALS: Goals reviewed with patient? No  SHORT TERM GOALS: Target date: 12/06/2023 patient will be independent with initial HEP  Baseline: Goal status: in progress  2.  Patient will report 50% improvement overall  Baseline:  Goal status: in progress   3.  Patient will be able to state 3/3 hip precautions independently; (no hip flexion past 90, no crossing legs; no IR of hip) Baseline:  Goal status: in progress   LONG TERM GOALS: Target date: 12/26/2023  Patient will be independent in self management strategies to improve quality of life and functional outcomes.  Baseline:  Goal status: in progress  2.  Patient will report 70% improvement overall  Baseline:  Goal status: in progress  3.  Patient will improve LEFS score by 30  points to demonstrate improved perceived function  Baseline: 13/80 Goal status: in progress  4.  Patient will improve TUG score to 12 sec or less to demonstrate decreased fall risk and improved functional mobility Baseline: 21.79 sec Goal status: in progress   5.  Patient will improve 5 times sit to stand score to 12 sec or less to demonstrate improved functional mobility and increased leg strength.    Baseline: 19.16 sec Goal status: in progress    PLAN:  PT FREQUENCY: 2x/week  PT DURATION: 6 weeks  PLANNED INTERVENTIONS: 97164- PT Re-evaluation, 97110-Therapeutic exercises, 97530- Therapeutic activity, 97112- Neuromuscular re-education, 97535- Self Care, [id number]- Manual therapy, [id number]- Gait training, [phone]- Orthotic Fit/training, [phone]- Canalith repositioning, [id number]- Aquatic Therapy, [phone]- Splinting, [phone]- Wound care (first 20 sq cm), 97598- Wound care (each additional 20 sq cm)Patient/Family education, Balance training, Stair training, Taping, Dry Needling, Joint mobilization, Joint manipulation, Spinal manipulation, Spinal  mobilization, Scar mobilization, and DME instructions.   PLAN FOR NEXT SESSION:  posterior hip precautions; right leg strengthening; gait training, balance; stairs   Augustin Mclean, LPTA/CLT; CBIS [phone]  4:42 PM, 12/03/23

## 2023-12-05 ENCOUNTER — Ambulatory Visit (HOSPITAL_COMMUNITY)

## 2023-12-05 DIAGNOSIS — R262 Difficulty in walking, not elsewhere classified: Secondary | ICD-10-CM | POA: Diagnosis not present

## 2023-12-05 DIAGNOSIS — M25551 Pain in right hip: Secondary | ICD-10-CM | POA: Diagnosis not present

## 2023-12-05 DIAGNOSIS — S72001A Fracture of unspecified part of neck of right femur, initial encounter for closed fracture: Secondary | ICD-10-CM | POA: Diagnosis not present

## 2023-12-05 DIAGNOSIS — Z96641 Presence of right artificial hip joint: Secondary | ICD-10-CM | POA: Diagnosis not present

## 2023-12-05 NOTE — Therapy (Signed)
 OUTPATIENT PHYSICAL THERAPY LOWER EXTREMITY TREATMENT   Patient Name: Jacqueline Koch MRN: [id number] DOB:[birth year], 28 y.o., female Today's Date: 12/05/2023  END OF SESSION:  PT End of Session - 12/05/23 1502     Visit Number 6    Number of Visits 12    Date for Recertification  12/26/23    Authorization Type [place] Medicaid Healthy Blue    Authorization Time Period 12 visits from 10/10 to 02/12/23    Authorization - Visit Number 6    Authorization - Number of Visits 12    Progress Note Due on Visit 10    PT Start Time 1502    PT Stop Time 1542    PT Time Calculation (min) 40 min    Activity Tolerance Patient tolerated treatment well    Behavior During Therapy [facility] for tasks assessed/performed           Past Medical History:  Diagnosis Date   Chlamydia infection 01/20/2013   Closed nondisplaced fracture of shaft of left clavicle 03/30/2020   Kidney stone    Nexplanon  insertion 08/22/2015   08/22/2015       12/02/18 removal & reinsertion     Nexplanon  removal 06/09/2014   Nondisplaced fracture of body of left scapula with routine healing 03/30/2020   Syncope    Past Surgical History:  Procedure Laterality Date   TOTAL HIP ARTHROPLASTY Right 11/03/2023   Procedure: ARTHROPLASTY, HIP, TOTAL, ANTERIOR APPROACH VS POSSIBLE SCREW FIXATION;  Surgeon: Kendal Franky SQUIBB, MD;  Location: [facility] OR;  Service: Orthopedics;  Laterality: Right;   Patient Active Problem List   Diagnosis Date Noted   Acute postoperative anemia due to expected blood loss 11/04/2023   S/P total right hip arthroplasty - 11-04-2023 by Dr. Kendal 11/03/2023   Closed right hip fracture (HCC) 11/01/2023   Marijuana use 12/08/2014   Hearing deficit 12/01/2012   Scoliosis 11/02/2010    PCP: no PCP  REFERRING PROVIDER: Kendal Franky SQUIBB, MD  REFERRING DIAG: s/p Right THA  THERAPY DIAG:  Difficulty in walking, not elsewhere classified  S/P total right hip arthroplasty  Pain in right hip  Rationale for  Evaluation and Treatment: Rehabilitation  ONSET DATE: 11/03/23  SUBJECTIVE:   SUBJECTIVE STATEMENT: Right knee is a little sore 4/10 but a little better than before; trying to work hard on walking right. Arrives with RW   Eval:Fell Friday 9/27 in [employer]; slipped in water; almost did a split; was unable to get up; went via ambulance to [facility] then to [facility]; surgery done 9/28 discharged home on Monday.  PERTINENT HISTORY: Here previously for therapy prior to fall PAIN:  Are you having pain? Yes: NPRS scale: 3/10 Pain location: right hip Pain description: sore/stiff Aggravating factors: just sitting prolonged Relieving factors: elevating right leg; ice packs; pain medication  PRECAUTIONS: Posterior hip    WEIGHT BEARING RESTRICTIONS: No  FALLS:  Has patient fallen in last 6 months? Yes. Number of falls 1   OCCUPATION:   PLOF: Independent  PATIENT GOALS: walk without the walker  NEXT MD VISIT: next Tuesday 10/14  OBJECTIVE:  Note: Objective measures were completed at Evaluation unless otherwise noted.  DIAGNOSTIC FINDINGS:   PATIENT SURVEYS:  LEFS  Extreme difficulty/unable (0), Quite a bit of difficulty (1), Moderate difficulty (2), Little difficulty (3), No difficulty (4) Survey date:    Any of your usual work, housework or school activities   2. Usual hobbies, recreational or sporting activities   3. Getting into/out of the  bath   4. Walking between rooms   5. Putting on socks/shoes   6. Squatting    7. Lifting an object, like a bag of groceries from the floor   8. Performing light activities around your home   9. Performing heavy activities around your home   10. Getting into/out of a car   11. Walking 2 blocks   12. Walking 1 mile   13. Going up/down 10 stairs (1 flight)   14. Standing for 1 hour   15.  sitting for 1 hour   16. Running on even ground   17. Running on uneven ground   18. Making sharp turns while running fast   19. Hopping    20.  Rolling over in bed   Score total:  13/80; 16.3%     COGNITION: Overall cognitive status: Within functional limits for tasks assessed     SENSATION: WFL  EDEMA:  Yes right hip normal for this time s/p  PALPATION: Swelling right hip  LOWER EXTREMITY ROM:  Active ROM Right eval Left eval  Hip flexion    Hip extension    Hip abduction    Hip adduction    Hip internal rotation    Hip external rotation    Knee flexion    Knee extension    Ankle dorsiflexion    Ankle plantarflexion    Ankle inversion    Ankle eversion     (Blank rows = not tested)  LOWER EXTREMITY MMT:  MMT Right eval Left eval  Hip flexion N/t   Hip extension    Hip abduction    Hip adduction    Hip internal rotation    Hip external rotation    Knee flexion    Knee extension 3- 5  Ankle dorsiflexion 5 5  Ankle plantarflexion    Ankle inversion    Ankle eversion     (Blank rows = not tested)   FUNCTIONAL TESTS:  5 times sit to stand: 19.16 sec Timed up and go (TUG): 21.79 sec with RW  GAIT: Distance walked: 60 ft in clinic Assistive device utilized: Walker - 2 wheeled Level of assistance: SBA Comments: antalgic gait decreased stance right lower extremity                                                                                                                                TREATMENT DATE:  12/05/23 Standing: Heel/toe raises 2 x 10 4 box toe taps 2 x 10 Stagger stance 2 x 30 each no UE assist Stand on foam 3 x 1 minute no UE assist Ambulation in  // bars with mirror for feedback down and back light bilateral UE assist x 5 Squats mini to chair 2 x 10 Sidestepping // bars with bilateral UE assist x 3 Standing hamstring curls with 2# weight 2 x 10    12/03/23: Leg length measurements- equal Discussed equal weight bearing stance Rockerboard:  Rt/Lt x 2 min; Df/PF x 2 min Toe tapping on 6in 20x  Bed mobility- encouraged to stop helping Rt LE up Supine- March to knee  extended  Unable to complete SLR- complete with knee flexed to 60 degrees Sidelying:  Clam 10x 5 Sit to stand with increased Rt weight bearing elevated mat height to 20 in Standing reaching to Rt side to increased Rt LE weightbearing EOS pt able to lift LE onto mat  11/27/23: Standing:  with bil UE assist in // Heel raises, 2x10 Toe raises, 2x10 hip abduction, RTB at knees, 10x2 Forward lunges onto 4 step 2X10 each no UE assist to intermittent Tandem balance, 30x2 each LE leading Attempted vectors but unable to complete due to Rt knee pain High marching, alternating 10X each Seated LAQ 10X5 no weight, 4# 10X Sit to stands feet even, no UE 2X10  11/21/23: STS from chair height, 10x  10x with weight shift to RLE, to pt tolerance Heel raises, 2x10 Toe raises, 2x10 Seated hip adduction, 5 holds, 15x Seated hip abduction, RTB at knees, 5 holds, 15x Standing hip abduction, RTB at knees, 10x2 Seated LAQ, 3 lb. AW, 3 holds, 2x10 Standing hamstring curl on RLE, 3 lb. AW, 2x10 Tandem balance, 30x2 each LE leading   11/19/23 Review of HEP and goals Review of Hip precautions Standing  Heel raises 2 x 10 Slant board 3 x 20 Weight shifts side to side 2 x 10 Trial of marching; cannot put full weight on right leg to lift left so only right hip flexion 2 x 10 Sit to stand x 5 using hands Right knee flexion 2 x 10 Supine Quad set 5 hold x 10 Heel slides x 10 SAQ's x 10   11/13/23 physical therapy evaluation and HEP instruction    PATIENT EDUCATION:  Education details: Patient educated on exam findings, POC, scope of PT, HEP, and what to expect next visit; review of hip precautions. Person educated: Patient Education method: Explanation, Demonstration, and Handouts Education comprehension: verbalized understanding, returned demonstration, verbal cues required, and tactile cues required   HOME EXERCISE PROGRAM: Access Code: [email or web address] URL:  [email or web address] Date: 11/14/2023 Prepared by: [facility] - Rehab  Exercises - Seated Long Arc Quad  - 2 x daily - 7 x weekly - 2 sets - 10 reps - Seated Heel Toe Raises  - 2 x daily - 7 x weekly - 2 sets - 10 reps - Sit to Stand with Armchair  - 2 x daily - 7 x weekly - 1 sets - 5 reps - Heel Raises with Counter Support  - 2 x daily - 7 x weekly - 2 sets - 10 reps - Standing March with Counter Support  - 2 x daily - 7 x weekly - 2 sets - 10 reps - Standing Knee Flexion with Counter Support  - 2 x daily - 7 x weekly - 2 sets - 10 reps - Supine Quad Set  - 2 x daily - 7 x weekly - 1 sets - 10 reps - 5 sec hold - Hooklying Gluteal Sets  - 1 x daily - 7 x weekly - 1 sets - 10 reps - 5 sec hold  ASSESSMENT:  CLINICAL IMPRESSION: Today's session with focus on gait training and balance.  She needs verbal and visual cues to avoid trunk substitution with stance on right leg. Continues with slight antalgic gait but overall is progressing.  Still has noted swelling right hip versus left.  Still needs RW for  safety with ambulation; able to stand statically now with upper extremity assist but needs it with any type of dynamic movement.  Patient will benefit from continued skilled therapy services to address deficits and promote return to optimal function.      Eval:Patient is a 28 y.o. female who was seen today for physical therapy evaluation and treatment for s/p right THA after a fall and hip fracture; Patient demonstrates muscle weakness, reduced ROM, and fascial restrictions which are likely contributing to symptoms of pain and are negatively impacting patient ability to perform ADLs and functional mobility tasks. Patient will benefit from skilled physical therapy services to address these deficits to reduce pain and improve level of function with ADLs and functional mobility tasks.   OBJECTIVE IMPAIRMENTS: Abnormal gait, decreased activity tolerance, decreased balance, difficulty walking,  decreased strength, increased edema, increased fascial restrictions, impaired perceived functional ability, and pain.   ACTIVITY LIMITATIONS: carrying, lifting, bending, sitting, standing, squatting, sleeping, stairs, transfers, bed mobility, bathing, toileting, dressing, and locomotion level  PARTICIPATION LIMITATIONS: meal prep, cleaning, laundry, driving, shopping, and community activity  REHAB POTENTIAL: Good  CLINICAL DECISION MAKING: Evolving/moderate complexity  EVALUATION COMPLEXITY: Moderate   GOALS: Goals reviewed with patient? No  SHORT TERM GOALS: Target date: 12/06/2023 patient will be independent with initial HEP  Baseline: Goal status: in progress  2.  Patient will report 50% improvement overall  Baseline:  Goal status: in progress   3.  Patient will be able to state 3/3 hip precautions independently; (no hip flexion past 90, no crossing legs; no IR of hip) Baseline:  Goal status: in progress   LONG TERM GOALS: Target date: 12/26/2023  Patient will be independent in self management strategies to improve quality of life and functional outcomes.  Baseline:  Goal status: in progress  2.  Patient will report 70% improvement overall  Baseline:  Goal status: in progress  3.  Patient will improve LEFS score by 30  points to demonstrate improved perceived function  Baseline: 13/80 Goal status: in progress  4.  Patient will improve TUG score to 12 sec or less to demonstrate decreased fall risk and improved functional mobility Baseline: 21.79 sec Goal status: in progress   5.  Patient will improve 5 times sit to stand score to 12 sec or less to demonstrate improved functional mobility and increased leg strength.    Baseline: 19.16 sec Goal status: in progress    PLAN:  PT FREQUENCY: 2x/week  PT DURATION: 6 weeks  PLANNED INTERVENTIONS: 97164- PT Re-evaluation, 97110-Therapeutic exercises, 97530- Therapeutic activity, 97112- Neuromuscular  re-education, 97535- Self Care, 02859- Manual therapy, [id number]- Gait training, [phone]- Orthotic Fit/training, [phone]- Canalith repositioning, [id number]- Aquatic Therapy, [phone]- Splinting, [phone]- Wound care (first 20 sq cm), 97598- Wound care (each additional 20 sq cm)Patient/Family education, Balance training, Stair training, Taping, Dry Needling, Joint mobilization, Joint manipulation, Spinal manipulation, Spinal mobilization, Scar mobilization, and DME instructions.   PLAN FOR NEXT SESSION:  posterior hip precautions; right leg strengthening; gait training, balance; stairs   3:47 PM, 12/05/23 Anquanette Bahner Small Danelle Curiale MPT [place] [facility]  [phone] Ph:[phone]

## 2023-12-09 ENCOUNTER — Encounter: Payer: Self-pay | Admitting: Radiology

## 2023-12-10 ENCOUNTER — Ambulatory Visit (HOSPITAL_COMMUNITY): Attending: Sports Medicine | Admitting: Physical Therapy

## 2023-12-10 DIAGNOSIS — Z96641 Presence of right artificial hip joint: Secondary | ICD-10-CM | POA: Diagnosis not present

## 2023-12-10 DIAGNOSIS — M25551 Pain in right hip: Secondary | ICD-10-CM | POA: Diagnosis not present

## 2023-12-10 DIAGNOSIS — R262 Difficulty in walking, not elsewhere classified: Secondary | ICD-10-CM | POA: Diagnosis not present

## 2023-12-10 NOTE — Therapy (Signed)
 OUTPATIENT PHYSICAL THERAPY LOWER EXTREMITY TREATMENT   Patient Name: Jacqueline Koch MRN: [id number] DOB:09-Apr-1995, 28 y.o., female Today's Date: 12/10/2023  END OF SESSION:     Past Medical History:  Diagnosis Date   Chlamydia infection 01/20/2013   Closed nondisplaced fracture of shaft of left clavicle 03/30/2020   Kidney stone    Nexplanon  insertion 08/22/2015   08/22/2015       12/02/18 removal & reinsertion     Nexplanon  removal 06/09/2014   Nondisplaced fracture of body of left scapula with routine healing 03/30/2020   Syncope    Past Surgical History:  Procedure Laterality Date   TOTAL HIP ARTHROPLASTY Right 11/03/2023   Procedure: ARTHROPLASTY, HIP, TOTAL, ANTERIOR APPROACH VS POSSIBLE SCREW FIXATION;  Surgeon: Kendal Franky SQUIBB, MD;  Location: [facility] OR;  Service: Orthopedics;  Laterality: Right;   Patient Active Problem List   Diagnosis Date Noted   Acute postoperative anemia due to expected blood loss 11/04/2023   S/P total right hip arthroplasty - 11-04-2023 by Dr. Kendal 11/03/2023   Closed right hip fracture (HCC) 11/01/2023   Marijuana use 12/08/2014   Hearing deficit 12/01/2012   Scoliosis 11/02/2010    PCP: no PCP  REFERRING PROVIDER: Kendal Franky SQUIBB, MD  REFERRING DIAG: s/p Right THA  THERAPY DIAG:  Difficulty in walking, not elsewhere classified  S/P total right hip arthroplasty  Pain in right hip  Rationale for Evaluation and Treatment: Rehabilitation  ONSET DATE: 11/03/23  SUBJECTIVE:   SUBJECTIVE STATEMENT: Pt reports Rt knee is still not right with some pain at times. Arrives with RW   Eval:Fell Friday 9/27 in [employer]; slipped in water; almost did a split; was unable to get up; went via ambulance to [facility] then to [facility]; surgery done 9/28 discharged home on Monday.  PERTINENT HISTORY: Here previously for therapy prior to fall PAIN:  Are you having pain? Yes: NPRS scale: 3/10 Pain location: right hip Pain description:  sore/stiff Aggravating factors: just sitting prolonged Relieving factors: elevating right leg; ice packs; pain medication  PRECAUTIONS: Posterior hip    WEIGHT BEARING RESTRICTIONS: No  FALLS:  Has patient fallen in last 6 months? Yes. Number of falls 1   OCCUPATION:   PLOF: Independent  PATIENT GOALS: walk without the walker  NEXT MD VISIT: next Tuesday 10/14  OBJECTIVE:  Note: Objective measures were completed at Evaluation unless otherwise noted.  DIAGNOSTIC FINDINGS:   PATIENT SURVEYS:  LEFS  Extreme difficulty/unable (0), Quite a bit of difficulty (1), Moderate difficulty (2), Little difficulty (3), No difficulty (4) Survey date:    Any of your usual work, housework or school activities   2. Usual hobbies, recreational or sporting activities   3. Getting into/out of the bath   4. Walking between rooms   5. Putting on socks/shoes   6. Squatting    7. Lifting an object, like a bag of groceries from the floor   8. Performing light activities around your home   9. Performing heavy activities around your home   10. Getting into/out of a car   11. Walking 2 blocks   12. Walking 1 mile   13. Going up/down 10 stairs (1 flight)   14. Standing for 1 hour   15.  sitting for 1 hour   16. Running on even ground   17. Running on uneven ground   18. Making sharp turns while running fast   19. Hopping    20. Rolling over in bed   Score  total:  13/80; 16.3%     COGNITION: Overall cognitive status: Within functional limits for tasks assessed     SENSATION: WFL  EDEMA:  Yes right hip normal for this time s/p  PALPATION: Swelling right hip  LOWER EXTREMITY ROM:  Active ROM Right eval Left eval  Hip flexion    Hip extension    Hip abduction    Hip adduction    Hip internal rotation    Hip external rotation    Knee flexion    Knee extension    Ankle dorsiflexion    Ankle plantarflexion    Ankle inversion    Ankle eversion     (Blank rows = not  tested)  LOWER EXTREMITY MMT:  MMT Right eval Left eval  Hip flexion N/t   Hip extension    Hip abduction    Hip adduction    Hip internal rotation    Hip external rotation    Knee flexion    Knee extension 3- 5  Ankle dorsiflexion 5 5  Ankle plantarflexion    Ankle inversion    Ankle eversion     (Blank rows = not tested)   FUNCTIONAL TESTS:  5 times sit to stand: 19.16 sec Timed up and go (TUG): 21.79 sec with RW  GAIT: Distance walked: 60 ft in clinic Assistive device utilized: Walker - 2 wheeled Level of assistance: SBA Comments: antalgic gait decreased stance right lower extremity                                                                                                                                TREATMENT DATE:  12/10/23 Standing: in // with bil UE assist Heel/toe raises 20X each on incline/decline hamstring curls with 3# weight 2 x 10 Vectors bil 2X5 with 3 holds  Vectors standing on Rt only 3X3 1 UE only 4 box fwd step ups Rt leading  2 x 10 4 box fwd step downs Lt leading 2X10 Tandem stance on airex BB strip 2X30 each Gait with SPC X 100 feet, SBA Squats mini to chair 10X cues to shift more weight to Rt side   12/05/23 Standing: Heel/toe raises 2 x 10 4 box toe taps 2 x 10 Stagger stance 2 x 30 each no UE assist Stand on foam 3 x 1 minute no UE assist Ambulation in  // bars with mirror for feedback down and back light bilateral UE assist x 5 Squats mini to chair 2 x 10 Sidestepping // bars with bilateral UE assist x 3 Standing hamstring curls with 2# weight 2 x 10   12/03/23: Leg length measurements- equal Discussed equal weight bearing stance Rockerboard: Rt/Lt x 2 min; Df/PF x 2 min Toe tapping on 6in 20x  Bed mobility- encouraged to stop helping Rt LE up Supine- March to knee extended  Unable to complete SLR- complete with knee flexed to 60 degrees Sidelying:  Clam 10x 5 Sit  to stand with increased Rt weight bearing  elevated mat height to 20 in Standing reaching to Rt side to increased Rt LE weightbearing EOS pt able to lift LE onto mat  11/27/23: Standing:  with bil UE assist in // Heel raises, 2x10 Toe raises, 2x10 hip abduction, RTB at knees, 10x2 Forward lunges onto 4 step 2X10 each no UE assist to intermittent Tandem balance, 30x2 each LE leading Attempted vectors but unable to complete due to Rt knee pain High marching, alternating 10X each Seated LAQ 10X5 no weight, 4# 10X Sit to stands feet even, no UE 2X10  11/21/23: STS from chair height, 10x  10x with weight shift to RLE, to pt tolerance Heel raises, 2x10 Toe raises, 2x10 Seated hip adduction, 5 holds, 15x Seated hip abduction, RTB at knees, 5 holds, 15x Standing hip abduction, RTB at knees, 10x2 Seated LAQ, 3 lb. AW, 3 holds, 2x10 Standing hamstring curl on RLE, 3 lb. AW, 2x10 Tandem balance, 30x2 each LE leading   11/19/23 Review of HEP and goals Review of Hip precautions Standing  Heel raises 2 x 10 Slant board 3 x 20 Weight shifts side to side 2 x 10 Trial of marching; cannot put full weight on right leg to lift left so only right hip flexion 2 x 10 Sit to stand x 5 using hands Right knee flexion 2 x 10 Supine Quad set 5 hold x 10 Heel slides x 10 SAQ's x 10   11/13/23 physical therapy evaluation and HEP instruction    PATIENT EDUCATION:  Education details: Patient educated on exam findings, POC, scope of PT, HEP, and what to expect next visit; review of hip precautions. Person educated: Patient Education method: Explanation, Demonstration, and Handouts Education comprehension: verbalized understanding, returned demonstration, verbal cues required, and tactile cues required   HOME EXERCISE PROGRAM: Access Code: [email or web address] URL: [email or web address] Date: 11/14/2023 Prepared by: [facility] - Rehab  Exercises - Seated Long Arc Quad  - 2 x daily - 7 x weekly - 2 sets - 10 reps - Seated Heel  Toe Raises  - 2 x daily - 7 x weekly - 2 sets - 10 reps - Sit to Stand with Armchair  - 2 x daily - 7 x weekly - 1 sets - 5 reps - Heel Raises with Counter Support  - 2 x daily - 7 x weekly - 2 sets - 10 reps - Standing March with Counter Support  - 2 x daily - 7 x weekly - 2 sets - 10 reps - Standing Knee Flexion with Counter Support  - 2 x daily - 7 x weekly - 2 sets - 10 reps - Supine Quad Set  - 2 x daily - 7 x weekly - 1 sets - 10 reps - 5 sec hold - Hooklying Gluteal Sets  - 1 x daily - 7 x weekly - 1 sets - 10 reps - 5 sec hold  ASSESSMENT:  CLINICAL IMPRESSION: Pt continues to use RW for gait at 5 weeks post op. Trained today using SPC with overall good sequencing and no c/o pain.  Pt does present with antalgic gait, however continues to also with RW.  Encouraged pt to work on using [facility] at home as well.  Increased weight with hamstring curl and also added foam to tandem stance as no longer challenging.  Squats continued with near full ROM to chair, however not completely being able to control as begins to favor Lt side too much.   Pt was  able to complete vector activity with full WB through Rt as was unable to do this previously.  Only able to complete 3 repetitions using only 1 UE assist. Forward step ups and downs also added with noted shaking of LE due to fatigue.  Pt will continue to benefit from continued skilled therapy services to address deficits and promote return to optimal function.      Eval:Patient is a 28 y.o. female who was seen today for physical therapy evaluation and treatment for s/p right THA after a fall and hip fracture; Patient demonstrates muscle weakness, reduced ROM, and fascial restrictions which are likely contributing to symptoms of pain and are negatively impacting patient ability to perform ADLs and functional mobility tasks. Patient will benefit from skilled physical therapy services to address these deficits to reduce pain and improve level of function with ADLs  and functional mobility tasks.   OBJECTIVE IMPAIRMENTS: Abnormal gait, decreased activity tolerance, decreased balance, difficulty walking, decreased strength, increased edema, increased fascial restrictions, impaired perceived functional ability, and pain.   ACTIVITY LIMITATIONS: carrying, lifting, bending, sitting, standing, squatting, sleeping, stairs, transfers, bed mobility, bathing, toileting, dressing, and locomotion level  PARTICIPATION LIMITATIONS: meal prep, cleaning, laundry, driving, shopping, and community activity  REHAB POTENTIAL: Good  CLINICAL DECISION MAKING: Evolving/moderate complexity  EVALUATION COMPLEXITY: Moderate   GOALS: Goals reviewed with patient? No  SHORT TERM GOALS: Target date: 12/06/2023 patient will be independent with initial HEP  Baseline: Goal status: in progress  2.  Patient will report 50% improvement overall  Baseline:  Goal status: in progress   3.  Patient will be able to state 3/3 hip precautions independently; (no hip flexion past 90, no crossing legs; no IR of hip) Baseline:  Goal status: in progress   LONG TERM GOALS: Target date: 12/26/2023  Patient will be independent in self management strategies to improve quality of life and functional outcomes.  Baseline:  Goal status: in progress  2.  Patient will report 70% improvement overall  Baseline:  Goal status: in progress  3.  Patient will improve LEFS score by 30  points to demonstrate improved perceived function  Baseline: 13/80 Goal status: in progress  4.  Patient will improve TUG score to 12 sec or less to demonstrate decreased fall risk and improved functional mobility Baseline: 21.79 sec Goal status: in progress   5.  Patient will improve 5 times sit to stand score to 12 sec or less to demonstrate improved functional mobility and increased leg strength.    Baseline: 19.16 sec Goal status: in progress    PLAN:  PT FREQUENCY: 2x/week  PT DURATION: 6  weeks  PLANNED INTERVENTIONS: 97164- PT Re-evaluation, 97110-Therapeutic exercises, 97530- Therapeutic activity, 97112- Neuromuscular re-education, 97535- Self Care, [id number]- Manual therapy, [id number]- Gait training, [phone]- Orthotic Fit/training, [phone]- Canalith repositioning, [id number]- Aquatic Therapy, [phone]- Splinting, [phone]- Wound care (first 20 sq cm), 97598- Wound care (each additional 20 sq cm)Patient/Family education, Balance training, Stair training, Taping, Dry Needling, Joint mobilization, Joint manipulation, Spinal manipulation, Spinal mobilization, Scar mobilization, and DME instructions.   PLAN FOR NEXT SESSION:  posterior hip precautions; right leg strengthening; gait training, balance; stairs.  Progress difficulty; begin side stepping outside // bars, continue with gait LRAD   2:36 PM, 12/10/23 Greig KATHEE Fuse, PTA/CLT [facility] Ph: [phone]

## 2023-12-12 ENCOUNTER — Ambulatory Visit (HOSPITAL_COMMUNITY): Admitting: Physical Therapy

## 2023-12-12 DIAGNOSIS — R262 Difficulty in walking, not elsewhere classified: Secondary | ICD-10-CM | POA: Diagnosis not present

## 2023-12-12 DIAGNOSIS — M25551 Pain in right hip: Secondary | ICD-10-CM

## 2023-12-12 DIAGNOSIS — Z96641 Presence of right artificial hip joint: Secondary | ICD-10-CM

## 2023-12-12 NOTE — Therapy (Signed)
 OUTPATIENT PHYSICAL THERAPY LOWER EXTREMITY TREATMENT   Patient Name: Jacqueline Koch MRN: [id number] DOB:[birth year], 28 y.o., female Today's Date: 12/12/2023  END OF SESSION:  PT End of Session - 12/12/23 1632     Visit Number 8    Number of Visits 12    Date for Recertification  12/26/23    Authorization Type [place] Medicaid [employer]    Authorization Time Period 12 visits from 10/10 to 02/12/23    Authorization - Visit Number 8    Authorization - Number of Visits 12    Progress Note Due on Visit 10    PT Start Time 1540    PT Stop Time 1625    PT Time Calculation (min) 45 min    Activity Tolerance Patient tolerated treatment well    Behavior During Therapy [facility] for tasks assessed/performed            Past Medical History:  Diagnosis Date   Chlamydia infection 01/20/2013   Closed nondisplaced fracture of shaft of left clavicle 03/30/2020   Kidney stone    Nexplanon  insertion 08/22/2015   08/22/2015       12/02/18 removal & reinsertion     Nexplanon  removal 06/09/2014   Nondisplaced fracture of body of left scapula with routine healing 03/30/2020   Syncope    Past Surgical History:  Procedure Laterality Date   TOTAL HIP ARTHROPLASTY Right 11/03/2023   Procedure: ARTHROPLASTY, HIP, TOTAL, ANTERIOR APPROACH VS POSSIBLE SCREW FIXATION;  Surgeon: Kendal Franky SQUIBB, MD;  Location: [facility] OR;  Service: Orthopedics;  Laterality: Right;   Patient Active Problem List   Diagnosis Date Noted   Acute postoperative anemia due to expected blood loss 11/04/2023   S/P total right hip arthroplasty - 11-04-2023 by Dr. Kendal 11/03/2023   Closed right hip fracture (HCC) 11/01/2023   Marijuana use 12/08/2014   Hearing deficit 12/01/2012   Scoliosis 11/02/2010    PCP: no PCP  REFERRING PROVIDER: Kendal Franky SQUIBB, MD  REFERRING DIAG: s/p Right THA  THERAPY DIAG:  Difficulty in walking, not elsewhere classified  S/P total right hip arthroplasty  Pain in right hip  Rationale for  Evaluation and Treatment: Rehabilitation  ONSET DATE: 11/03/23  SUBJECTIVE:   SUBJECTIVE STATEMENT: Pt reports still having pain with weight bearing less than 3/10.  States she was sore after last visit but not today.  Reports she noticed if she contracts her mm when she's walking her Rt knee does not hurt with walking so trying to be more award of that.  Arrives with RW   Eval:Fell Friday 9/27 in [employer]; slipped in water; almost did a split; was unable to get up; went via ambulance to [facility] then to [facility]; surgery done 9/28 discharged home on Monday.  PERTINENT HISTORY: Here previously for therapy prior to fall PAIN:  Are you having pain? Yes: NPRS scale: 3/10 with weight bearing only, 0/10 without Pain location: right hip Pain description: sore/stiff Aggravating factors: just sitting prolonged Relieving factors: elevating right leg; ice packs; pain medication  PRECAUTIONS: Posterior hip    WEIGHT BEARING RESTRICTIONS: No  FALLS:  Has patient fallen in last 6 months? Yes. Number of falls 1   OCCUPATION:   PLOF: Independent  PATIENT GOALS: walk without the walker  NEXT MD VISIT: next Tuesday 10/14  OBJECTIVE:  Note: Objective measures were completed at Evaluation unless otherwise noted.  DIAGNOSTIC FINDINGS:   PATIENT SURVEYS:  LEFS  Extreme difficulty/unable (0), Quite a bit of difficulty (1), Moderate  difficulty (2), Little difficulty (3), No difficulty (4) Survey date:    Any of your usual work, housework or school activities   2. Usual hobbies, recreational or sporting activities   3. Getting into/out of the bath   4. Walking between rooms   5. Putting on socks/shoes   6. Squatting    7. Lifting an object, like a bag of groceries from the floor   8. Performing light activities around your home   9. Performing heavy activities around your home   10. Getting into/out of a car   11. Walking 2 blocks   12. Walking 1 mile   13. Going up/down 10 stairs (1  flight)   14. Standing for 1 hour   15.  sitting for 1 hour   16. Running on even ground   17. Running on uneven ground   18. Making sharp turns while running fast   19. Hopping    20. Rolling over in bed   Score total:  13/80; 16.3%     COGNITION: Overall cognitive status: Within functional limits for tasks assessed     SENSATION: WFL  EDEMA:  Yes right hip normal for this time s/p  PALPATION: Swelling right hip   LOWER EXTREMITY MMT:  MMT Right eval Left eval  Hip flexion N/t   Hip extension    Hip abduction    Hip adduction    Hip internal rotation    Hip external rotation    Knee flexion    Knee extension 3- 5  Ankle dorsiflexion 5 5  Ankle plantarflexion    Ankle inversion    Ankle eversion     (Blank rows = not tested)   FUNCTIONAL TESTS:  5 times sit to stand: 19.16 sec Timed up and go (TUG): 21.79 sec with RW  GAIT: Distance walked: 60 ft in clinic Assistive device utilized: Walker - 2 wheeled Level of assistance: SBA Comments: antalgic gait decreased stance right lower extremity                                                                                                                                TREATMENT DATE:  12/12/23 Standing: in // with bil UE assist Heel/toe raises 20X each on incline/decline Vectors bil 2 sets 5 reps with 3 holds, bil UE assist Vectors standing on Rt only 3X3 1 UE only 4 box fwd step ups Rt leading  2 x 10 4 box fwd step downs Lt leading 2X10 4 lateral step downs, heel taps 2X10 Side stepping 43ft line with SPC 1RT Gait with SPC X 150 feet, SBA cues for even WB, cadence  12/10/23 Standing: in // with bil UE assist Heel/toe raises 20X each on incline/decline hamstring curls with 3# weight 2 x 10 Vectors bil 2 sets 5 reps with 3 holds, bil UE assist Vectors standing on Rt only 3X3 1 UE only 4 box fwd step ups Rt leading  2 x 10 4 box fwd step downs Lt leading 2X10 Tandem stance on airex BB strip  2X30 each Gait with SPC X 100 feet, SBA Squats mini to chair 10X cues to shift more weight to Rt side   12/05/23 Standing: Heel/toe raises 2 x 10 4 box toe taps 2 x 10 Stagger stance 2 x 30 each no UE assist Stand on foam 3 x 1 minute no UE assist Ambulation in  // bars with mirror for feedback down and back light bilateral UE assist x 5 Squats mini to chair 2 x 10 Sidestepping // bars with bilateral UE assist x 3 Standing hamstring curls with 2# weight 2 x 10   12/03/23: Leg length measurements- equal Discussed equal weight bearing stance Rockerboard: Rt/Lt x 2 min; Df/PF x 2 min Toe tapping on 6in 20x  Bed mobility- encouraged to stop helping Rt LE up Supine- March to knee extended  Unable to complete SLR- complete with knee flexed to 60 degrees Sidelying:  Clam 10x 5 Sit to stand with increased Rt weight bearing elevated mat height to 20 in Standing reaching to Rt side to increased Rt LE weightbearing EOS pt able to lift LE onto mat   PATIENT EDUCATION:  Education details: Patient educated on exam findings, POC, scope of PT, HEP, and what to expect next visit; review of hip precautions. Person educated: Patient Education method: Explanation, Demonstration, and Handouts Education comprehension: verbalized understanding, returned demonstration, verbal cues required, and tactile cues required   HOME EXERCISE PROGRAM: Access Code: [email or web address] URL: [email or web address] Date: 11/14/2023 Prepared by: [facility] - Rehab  Exercises - Seated Long Arc Quad  - 2 x daily - 7 x weekly - 2 sets - 10 reps - Seated Heel Toe Raises  - 2 x daily - 7 x weekly - 2 sets - 10 reps - Sit to Stand with Armchair  - 2 x daily - 7 x weekly - 1 sets - 5 reps - Heel Raises with Counter Support  - 2 x daily - 7 x weekly - 2 sets - 10 reps - Standing March with Counter Support  - 2 x daily - 7 x weekly - 2 sets - 10 reps - Standing Knee Flexion with Counter Support  - 2 x daily - 7 x  weekly - 2 sets - 10 reps - Supine Quad Set  - 2 x daily - 7 x weekly - 1 sets - 10 reps - 5 sec hold - Hooklying Gluteal Sets  - 1 x daily - 7 x weekly - 1 sets - 10 reps - 5 sec hold  ASSESSMENT:  CLINICAL IMPRESSION: Pt reports soreness following new activities last visit.  Explained this was welcoming as proof the mm was working.  Pt feels her knee issue is improving with more awareness of how she is walking and activating correct mm . Vectors remain challenging for pt, however able to complete all 5 repetitions standing on Rt LE only today and using only 1 UE (was only able to do 3 repetitions).  Continued with SPC with overall good sequencing and no c/o pain.  VC's for larger stride, more even steps to reduce antalgia.  ATtmepted forward step ups with only 1 UE, however was unable to complete without use of bil UE.  Began side stepping outside bars using SPC.  Pt very apprehensive originally but increased her comfort as she continued with activity.  Pt will continue to benefit from continued skilled therapy services to address  deficits and promote return to optimal function.      Eval:Patient is a 28 y.o. female who was seen today for physical therapy evaluation and treatment for s/p right THA after a fall and hip fracture; Patient demonstrates muscle weakness, reduced ROM, and fascial restrictions which are likely contributing to symptoms of pain and are negatively impacting patient ability to perform ADLs and functional mobility tasks. Patient will benefit from skilled physical therapy services to address these deficits to reduce pain and improve level of function with ADLs and functional mobility tasks.   OBJECTIVE IMPAIRMENTS: Abnormal gait, decreased activity tolerance, decreased balance, difficulty walking, decreased strength, increased edema, increased fascial restrictions, impaired perceived functional ability, and pain.   ACTIVITY LIMITATIONS: carrying, lifting, bending, sitting,  standing, squatting, sleeping, stairs, transfers, bed mobility, bathing, toileting, dressing, and locomotion level  PARTICIPATION LIMITATIONS: meal prep, cleaning, laundry, driving, shopping, and community activity  REHAB POTENTIAL: Good  CLINICAL DECISION MAKING: Evolving/moderate complexity  EVALUATION COMPLEXITY: Moderate   GOALS: Goals reviewed with patient? No  SHORT TERM GOALS: Target date: 12/06/2023 patient will be independent with initial HEP  Baseline: Goal status: in progress  2.  Patient will report 50% improvement overall  Baseline:  Goal status: in progress   3.  Patient will be able to state 3/3 hip precautions independently; (no hip flexion past 90, no crossing legs; no IR of hip) Baseline:  Goal status: in progress   LONG TERM GOALS: Target date: 12/26/2023  Patient will be independent in self management strategies to improve quality of life and functional outcomes.  Baseline:  Goal status: in progress  2.  Patient will report 70% improvement overall  Baseline:  Goal status: in progress  3.  Patient will improve LEFS score by 30  points to demonstrate improved perceived function  Baseline: 13/80 Goal status: in progress  4.  Patient will improve TUG score to 12 sec or less to demonstrate decreased fall risk and improved functional mobility Baseline: 21.79 sec Goal status: in progress   5.  Patient will improve 5 times sit to stand score to 12 sec or less to demonstrate improved functional mobility and increased leg strength.    Baseline: 19.16 sec Goal status: in progress    PLAN:  PT FREQUENCY: 2x/week  PT DURATION: 6 weeks  PLANNED INTERVENTIONS: 97164- PT Re-evaluation, 97110-Therapeutic exercises, 97530- Therapeutic activity, 97112- Neuromuscular re-education, 97535- Self Care, [id number]- Manual therapy, [id number]- Gait training, [phone]- Orthotic Fit/training, [phone]- Canalith repositioning, [id number]- Aquatic Therapy, [phone]- Splinting, [phone]-  Wound care (first 20 sq cm), 97598- Wound care (each additional 20 sq cm)Patient/Family education, Balance training, Stair training, Taping, Dry Needling, Joint mobilization, Joint manipulation, Spinal manipulation, Spinal mobilization, Scar mobilization, and DME instructions.   PLAN FOR NEXT SESSION:  posterior hip precautions; right leg strengthening; gait training, balance; stairs.  continue with gait LRAD   4:33 PM, 12/12/23 Greig KATHEE Fuse, PTA/CLT [facility] Ph: [phone]

## 2023-12-17 ENCOUNTER — Ambulatory Visit (HOSPITAL_COMMUNITY): Admitting: Physical Therapy

## 2023-12-17 DIAGNOSIS — M25561 Pain in right knee: Secondary | ICD-10-CM | POA: Diagnosis not present

## 2023-12-17 DIAGNOSIS — R262 Difficulty in walking, not elsewhere classified: Secondary | ICD-10-CM | POA: Diagnosis not present

## 2023-12-17 DIAGNOSIS — M25551 Pain in right hip: Secondary | ICD-10-CM | POA: Diagnosis not present

## 2023-12-17 DIAGNOSIS — Z96641 Presence of right artificial hip joint: Secondary | ICD-10-CM | POA: Diagnosis not present

## 2023-12-17 DIAGNOSIS — S72041D Displaced fracture of base of neck of right femur, subsequent encounter for closed fracture with routine healing: Secondary | ICD-10-CM | POA: Diagnosis not present

## 2023-12-17 NOTE — Therapy (Signed)
 OUTPATIENT PHYSICAL THERAPY LOWER EXTREMITY TREATMENT   Patient Name: Jacqueline Koch MRN: [id number] DOB:[birth year], 28 y.o., female Today's Date: 12/17/2023  END OF SESSION:  PT End of Session - 12/17/23 1557     Visit Number 9    Number of Visits 12    Date for Recertification  12/26/23    Authorization Type [place] Medicaid [employer]    Authorization Time Period 12 visits from 10/10 to 02/12/23    Authorization - Visit Number 9    Authorization - Number of Visits 12    Progress Note Due on Visit 10    PT Start Time 1510    PT Stop Time 1555    PT Time Calculation (min) 45 min    Activity Tolerance Patient tolerated treatment well    Behavior During Therapy [facility] for tasks assessed/performed             Past Medical History:  Diagnosis Date   Chlamydia infection 01/20/2013   Closed nondisplaced fracture of shaft of left clavicle 03/30/2020   Kidney stone    Nexplanon  insertion 08/22/2015   08/22/2015       12/02/18 removal & reinsertion     Nexplanon  removal 06/09/2014   Nondisplaced fracture of body of left scapula with routine healing 03/30/2020   Syncope    Past Surgical History:  Procedure Laterality Date   TOTAL HIP ARTHROPLASTY Right 11/03/2023   Procedure: ARTHROPLASTY, HIP, TOTAL, ANTERIOR APPROACH VS POSSIBLE SCREW FIXATION;  Surgeon: Kendal Franky SQUIBB, MD;  Location: [facility] OR;  Service: Orthopedics;  Laterality: Right;   Patient Active Problem List   Diagnosis Date Noted   Acute postoperative anemia due to expected blood loss 11/04/2023   S/P total right hip arthroplasty - 11-04-2023 by Dr. Kendal 11/03/2023   Closed right hip fracture (HCC) 11/01/2023   Marijuana use 12/08/2014   Hearing deficit 12/01/2012   Scoliosis 11/02/2010    PCP: no PCP  REFERRING PROVIDER: Kendal Franky SQUIBB, MD  REFERRING DIAG: s/p Right THA  THERAPY DIAG:  Difficulty in walking, not elsewhere classified  S/P total right hip arthroplasty  Pain in right hip  Rationale for  Evaluation and Treatment: Rehabilitation  ONSET DATE: 11/03/23  SUBJECTIVE:   SUBJECTIVE STATEMENT: Pt went to MD with overall good report, knee is doing well and MD released from all precautions.  States she is working on her exercises, been going up/down stairs sideways but would like to do normally.  States she is really trying ot focus and contract her mm when she's walking now. Still using  RW   Eval:Fell Friday 9/27 in [employer]; slipped in water; almost did a split; was unable to get up; went via ambulance to [facility] then to [facility]; surgery done 9/28 discharged home on Monday.  PERTINENT HISTORY: Here previously for therapy prior to fall PAIN:  Are you having pain? Yes: NPRS scale: 3/10 with weight bearing only, 0/10 without Pain location: right hip Pain description: sore/stiff Aggravating factors: just sitting prolonged Relieving factors: elevating right leg; ice packs; pain medication  PRECAUTIONS: Anterior hip From Dr Kendal through staff messenger 12/17/23:  good morning, I just saw this patient and she's doing well. She has no restrictions from my standpoint. She never had any hip precautions since we went anterior. Reach out with any other questions    WEIGHT BEARING RESTRICTIONS: No  FALLS:  Has patient fallen in last 6 months? Yes. Number of falls 1   OCCUPATION:   PLOF: Independent  PATIENT GOALS: walk without the walker  NEXT MD VISIT: next Tuesday 10/14  OBJECTIVE:  Note: Objective measures were completed at Evaluation unless otherwise noted.  DIAGNOSTIC FINDINGS:   PATIENT SURVEYS:  LEFS  Extreme difficulty/unable (0), Quite a bit of difficulty (1), Moderate difficulty (2), Little difficulty (3), No difficulty (4) Survey date:    Any of your usual work, housework or school activities   2. Usual hobbies, recreational or sporting activities   3. Getting into/out of the bath   4. Walking between rooms   5. Putting on socks/shoes   6. Squatting     7. Lifting an object, like a bag of groceries from the floor   8. Performing light activities around your home   9. Performing heavy activities around your home   10. Getting into/out of a car   11. Walking 2 blocks   12. Walking 1 mile   13. Going up/down 10 stairs (1 flight)   14. Standing for 1 hour   15.  sitting for 1 hour   16. Running on even ground   17. Running on uneven ground   18. Making sharp turns while running fast   19. Hopping    20. Rolling over in bed   Score total:  13/80; 16.3%     COGNITION: Overall cognitive status: Within functional limits for tasks assessed     SENSATION: WFL  EDEMA:  Yes right hip normal for this time s/p  PALPATION: Swelling right hip   LOWER EXTREMITY MMT:  MMT Right eval Left eval  Hip flexion N/t   Hip extension    Hip abduction    Hip adduction    Hip internal rotation    Hip external rotation    Knee flexion    Knee extension 3- 5  Ankle dorsiflexion 5 5  Ankle plantarflexion    Ankle inversion    Ankle eversion     (Blank rows = not tested)   FUNCTIONAL TESTS:  5 times sit to stand: 19.16 sec Timed up and go (TUG): 21.79 sec with RW  GAIT: Distance walked: 60 ft in clinic Assistive device utilized: Walker - 2 wheeled Level of assistance: SBA Comments: antalgic gait decreased stance right lower extremity                                                                                                                                TREATMENT DATE:  12/17/23 Standing: in // with bil UE assist Heel/toe raises 20X each on incline/decline Vectors bil 2 sets 5 reps with 3 holds, bil UE assist 4 box fwd step ups Rt leading  x 10 with only 1 UE assist (took increased time) Rockerboard balancing without UE X 2 minutes Gait with SPC X  SBA cues for even WB, cadence 4 stair step training with 1 UE assist only, step to gait Sit to stand with Rt closer to body, Lt on foam  cushion, no UE assist  10X  12/12/23 Standing: in // with bil UE assist Heel/toe raises 20X each on incline/decline Vectors bil 2 sets 5 reps with 3 holds, bil UE assist Vectors standing on Rt only 3X3 1 UE only 4 box fwd step ups Rt leading  2 x 10 4 box fwd step downs Lt leading 2X10 4 lateral step downs, heel taps 2X10 Side stepping 54ft line with SPC 1RT Gait with SPC X 150 feet, SBA cues for even WB, cadence  12/10/23 Standing: in // with bil UE assist Heel/toe raises 20X each on incline/decline hamstring curls with 3# weight 2 x 10 Vectors bil 2 sets 5 reps with 3 holds, bil UE assist Vectors standing on Rt only 3X3 1 UE only 4 box fwd step ups Rt leading  2 x 10 4 box fwd step downs Lt leading 2X10 Tandem stance on airex BB strip 2X30 each Gait with SPC X 100 feet, SBA Squats mini to chair 10X cues to shift more weight to Rt side   12/05/23 Standing: Heel/toe raises 2 x 10 4 box toe taps 2 x 10 Stagger stance 2 x 30 each no UE assist Stand on foam 3 x 1 minute no UE assist Ambulation in  // bars with mirror for feedback down and back light bilateral UE assist x 5 Squats mini to chair 2 x 10 Sidestepping // bars with bilateral UE assist x 3 Standing hamstring curls with 2# weight 2 x 10   12/03/23: Leg length measurements- equal Discussed equal weight bearing stance Rockerboard: Rt/Lt x 2 min; Df/PF x 2 min Toe tapping on 6in 20x  Bed mobility- encouraged to stop helping Rt LE up Supine- March to knee extended  Unable to complete SLR- complete with knee flexed to 60 degrees Sidelying:  Clam 10x 5 Sit to stand with increased Rt weight bearing elevated mat height to 20 in Standing reaching to Rt side to increased Rt LE weightbearing EOS pt able to lift LE onto mat   PATIENT EDUCATION:  Education details: Patient educated on exam findings, POC, scope of PT, HEP, and what to expect next visit; review of hip precautions. Person educated: Patient Education method:  Explanation, Demonstration, and Handouts Education comprehension: verbalized understanding, returned demonstration, verbal cues required, and tactile cues required   HOME EXERCISE PROGRAM: Access Code: [email or web address] URL: [email or web address] Date: 11/14/2023 Prepared by: [facility] - Rehab  Exercises - Seated Long Arc Quad  - 2 x daily - 7 x weekly - 2 sets - 10 reps - Seated Heel Toe Raises  - 2 x daily - 7 x weekly - 2 sets - 10 reps - Sit to Stand with Armchair  - 2 x daily - 7 x weekly - 1 sets - 5 reps - Heel Raises with Counter Support  - 2 x daily - 7 x weekly - 2 sets - 10 reps - Standing March with Counter Support  - 2 x daily - 7 x weekly - 2 sets - 10 reps - Standing Knee Flexion with Counter Support  - 2 x daily - 7 x weekly - 2 sets - 10 reps - Supine Quad Set  - 2 x daily - 7 x weekly - 1 sets - 10 reps - 5 sec hold - Hooklying Gluteal Sets  - 1 x daily - 7 x weekly - 1 sets - 10 reps - 5 sec hold  ASSESSMENT:  CLINICAL IMPRESSION:continued with focus on improving Rt LE strength and stabilization  to return to normalized gait and functioning.  Worked on completing more FWB activities throughout Rt LE with less UE assistance.  Abe to complete forward step ups with Rt LE leading and only 1 UE, however did take extended time to complete 10 Reps due to time spent for focus and recovery when isolating mm.  Worked on step to stair negotiation and 1 HR use as this is her entrance into her home.  Began with 4 step, however pt reports her step height is more like 7-8 inches.  Still with significant antalgic gait when using SPC.  Otherwise, continues to make slow and steady gains. Pt request to resume bike (either nustep or recumbent) next session as feels this helped.  Pt will continue to benefit from continued skilled therapy services to address deficits and promote return to optimal function.      Eval:Patient is a 28 y.o. female who was seen today for physical therapy evaluation and  treatment for s/p right THA after a fall and hip fracture; Patient demonstrates muscle weakness, reduced ROM, and fascial restrictions which are likely contributing to symptoms of pain and are negatively impacting patient ability to perform ADLs and functional mobility tasks. Patient will benefit from skilled physical therapy services to address these deficits to reduce pain and improve level of function with ADLs and functional mobility tasks.   OBJECTIVE IMPAIRMENTS: Abnormal gait, decreased activity tolerance, decreased balance, difficulty walking, decreased strength, increased edema, increased fascial restrictions, impaired perceived functional ability, and pain.   ACTIVITY LIMITATIONS: carrying, lifting, bending, sitting, standing, squatting, sleeping, stairs, transfers, bed mobility, bathing, toileting, dressing, and locomotion level  PARTICIPATION LIMITATIONS: meal prep, cleaning, laundry, driving, shopping, and community activity  REHAB POTENTIAL: Good  CLINICAL DECISION MAKING: Evolving/moderate complexity  EVALUATION COMPLEXITY: Moderate   GOALS: Goals reviewed with patient? No  SHORT TERM GOALS: Target date: 12/06/2023 patient will be independent with initial HEP  Baseline: Goal status: in progress  2.  Patient will report 50% improvement overall  Baseline:  Goal status: in progress   3.  Patient will be able to state 3/3 hip precautions independently; (no hip flexion past 90, no crossing legs; no IR of hip) Baseline:  Goal status: in progress   LONG TERM GOALS: Target date: 12/26/2023  Patient will be independent in self management strategies to improve quality of life and functional outcomes.  Baseline:  Goal status: in progress  2.  Patient will report 70% improvement overall  Baseline:  Goal status: in progress  3.  Patient will improve LEFS score by 30  points to demonstrate improved perceived function  Baseline: 13/80 Goal status: in progress  4.   Patient will improve TUG score to 12 sec or less to demonstrate decreased fall risk and improved functional mobility Baseline: 21.79 sec Goal status: in progress   5.  Patient will improve 5 times sit to stand score to 12 sec or less to demonstrate improved functional mobility and increased leg strength.    Baseline: 19.16 sec Goal status: in progress    PLAN:  PT FREQUENCY: 2x/week  PT DURATION: 6 weeks  PLANNED INTERVENTIONS: 97164- PT Re-evaluation, 97110-Therapeutic exercises, 97530- Therapeutic activity, 97112- Neuromuscular re-education, 97535- Self Care, [id number]- Manual therapy, [phone]- Gait training, [phone]- Orthotic Fit/training, [phone]- Canalith repositioning, [id number]- Aquatic Therapy, [phone]- Splinting, [phone]- Wound care (first 20 sq cm), 97598- Wound care (each additional 20 sq cm)Patient/Family education, Balance training, Stair training, Taping, Dry Needling, Joint mobilization, Joint manipulation, Spinal manipulation, Spinal mobilization, Scar mobilization,  and DME instructions.   PLAN FOR NEXT SESSION:  no hip precautions per MD.  Continue to progress Rt LE strengthening; gait training, balance; stairs.  continue with gait LRAD.  Next session begin with either nustep or bike per pt request.    3:58 PM, 12/17/23 Greig KATHEE Fuse, PTA/CLT [facility] Ph: [phone]

## 2023-12-19 ENCOUNTER — Ambulatory Visit (HOSPITAL_COMMUNITY)

## 2023-12-19 DIAGNOSIS — R262 Difficulty in walking, not elsewhere classified: Secondary | ICD-10-CM | POA: Diagnosis not present

## 2023-12-19 DIAGNOSIS — M25551 Pain in right hip: Secondary | ICD-10-CM

## 2023-12-19 DIAGNOSIS — Z96641 Presence of right artificial hip joint: Secondary | ICD-10-CM

## 2023-12-19 NOTE — Therapy (Signed)
 OUTPATIENT PHYSICAL THERAPY LOWER EXTREMITY TREATMENT   Patient Name: Jacqueline Koch MRN: [id number] DOB:1995/08/05, 28 y.o., female Today's Date: 12/19/2023  END OF SESSION:  PT End of Session - 12/19/23 1454     Visit Number 10    Number of Visits 12    Date for Recertification  01/16/24    Authorization Type [place] [employer]    Authorization Time Period 12 visits from 10/10 to 02/12/23    Authorization - Visit Number 10    Authorization - Number of Visits 12    Progress Note Due on Visit 10    PT Start Time 1459    PT Stop Time 1539    PT Time Calculation (min) 40 min    Activity Tolerance Patient tolerated treatment well    Behavior During Therapy [facility] for tasks assessed/performed             Past Medical History:  Diagnosis Date   Chlamydia infection 01/20/2013   Closed nondisplaced fracture of shaft of left clavicle 03/30/2020   Kidney stone    Nexplanon  insertion 08/22/2015   08/22/2015       12/02/18 removal & reinsertion     Nexplanon  removal 06/09/2014   Nondisplaced fracture of body of left scapula with routine healing 03/30/2020   Syncope    Past Surgical History:  Procedure Laterality Date   TOTAL HIP ARTHROPLASTY Right 11/03/2023   Procedure: ARTHROPLASTY, HIP, TOTAL, ANTERIOR APPROACH VS POSSIBLE SCREW FIXATION;  Surgeon: Kendal Franky SQUIBB, MD;  Location: [facility] OR;  Service: Orthopedics;  Laterality: Right;   Patient Active Problem List   Diagnosis Date Noted   Acute postoperative anemia due to expected blood loss 11/04/2023   S/P total right hip arthroplasty - 11-04-2023 by Dr. Kendal 11/03/2023   Closed right hip fracture (HCC) 11/01/2023   Marijuana use 12/08/2014   Hearing deficit 12/01/2012   Scoliosis 11/02/2010    PCP: no PCP  REFERRING PROVIDER: Kendal Franky SQUIBB, MD  REFERRING DIAG: s/p Right THA  THERAPY DIAG:  Difficulty in walking, not elsewhere classified  S/P total right hip arthroplasty  Pain in right hip  Rationale for  Evaluation and Treatment: Rehabilitation  ONSET DATE: 11/03/23  SUBJECTIVE:   SUBJECTIVE STATEMENT: Walking at home with [facility]; arrives with RW due to thinking she may need it after therapy; about 40% better overall  Eval:Fell Friday 9/27 in [employer]; slipped in water; almost did a split; was unable to get up; went via ambulance to [facility] then to [facility]; surgery done 9/28 discharged home on Monday.  PERTINENT HISTORY: Here previously for therapy prior to fall PAIN:  Are you having pain? Yes: NPRS scale: 3/10 with weight bearing only, 0/10 without Pain location: right hip Pain description: sore/stiff Aggravating factors: just sitting prolonged Relieving factors: elevating right leg; ice packs; pain medication  PRECAUTIONS: Anterior hip From Dr Kendal through staff messenger 12/17/23:  good morning, I just saw this patient and she's doing well. She has no restrictions from my standpoint. She never had any hip precautions since we went anterior. Reach out with any other questions    WEIGHT BEARING RESTRICTIONS: No  FALLS:  Has patient fallen in last 6 months? Yes. Number of falls 1   OCCUPATION:   PLOF: Independent  PATIENT GOALS: walk without the walker  NEXT MD VISIT: next Tuesday 10/14  OBJECTIVE:  Note: Objective measures were completed at Evaluation unless otherwise noted.  DIAGNOSTIC FINDINGS:   PATIENT SURVEYS:  LEFS  Extreme  difficulty/unable (0), Quite a bit of difficulty (1), Moderate difficulty (2), Little difficulty (3), No difficulty (4) Survey date:    Any of your usual work, housework or school activities   2. Usual hobbies, recreational or sporting activities   3. Getting into/out of the bath   4. Walking between rooms   5. Putting on socks/shoes   6. Squatting    7. Lifting an object, like a bag of groceries from the floor   8. Performing light activities around your home   9. Performing heavy activities around your home   10. Getting into/out  of a car   11. Walking 2 blocks   12. Walking 1 mile   13. Going up/down 10 stairs (1 flight)   14. Standing for 1 hour   15.  sitting for 1 hour   16. Running on even ground   17. Running on uneven ground   18. Making sharp turns while running fast   19. Hopping    20. Rolling over in bed   Score total:  13/80; 16.3%     COGNITION: Overall cognitive status: Within functional limits for tasks assessed     SENSATION: WFL  EDEMA:  Yes right hip normal for this time s/p  PALPATION: Swelling right hip   LOWER EXTREMITY MMT:  MMT Right eval Left eval Right 12/19/23 Left 12/19/23  Hip flexion N/t  3- 5  Hip extension      Hip abduction      Hip adduction      Hip internal rotation      Hip external rotation      Knee flexion      Knee extension 3- 5 4-* 5  Ankle dorsiflexion 5 5 5 5   Ankle plantarflexion      Ankle inversion      Ankle eversion       (Blank rows = not tested)   FUNCTIONAL TESTS:  5 times sit to stand: 19.16 sec Timed up and go (TUG): 21.79 sec with RW  GAIT: Distance walked: 60 ft in clinic Assistive device utilized: Walker - 2 wheeled Level of assistance: SBA Comments: antalgic gait decreased stance right lower extremity                                                                                                                                TREATMENT DATE:  12/19/23 Nustep seat 6 lvl 2 x 5' dynamic warm up Progress note LEFS 25/80 31.3% TUG 14.36 sec with RW 5 time sit to stand 14.28 sec MMT's see above Seated right hip flexion and abduction and back over 6 block to simulate getting leg in and out of car 2 x 5 Standing toe raises no UE assist for quad/hamstring co contract 2 x 15 Stagger stance and rock 2 x 10 Side stepping in // bars down and back x 5   12/17/23 Standing: in // with bil UE assist  Heel/toe raises 20X each on incline/decline Vectors bil 2 sets 5 reps with 3 holds, bil UE assist 4 box fwd step ups Rt  leading  x 10 with only 1 UE assist (took increased time) Rockerboard balancing without UE X 2 minutes Gait with SPC X  SBA cues for even WB, cadence 4 stair step training with 1 UE assist only, step to gait Sit to stand with Rt closer to body, Lt on foam cushion, no UE assist 10X  12/12/23 Standing: in // with bil UE assist Heel/toe raises 20X each on incline/decline Vectors bil 2 sets 5 reps with 3 holds, bil UE assist Vectors standing on Rt only 3X3 1 UE only 4 box fwd step ups Rt leading  2 x 10 4 box fwd step downs Lt leading 2X10 4 lateral step downs, heel taps 2X10 Side stepping 38ft line with SPC 1RT Gait with SPC X 150 feet, SBA cues for even WB, cadence  12/10/23 Standing: in // with bil UE assist Heel/toe raises 20X each on incline/decline hamstring curls with 3# weight 2 x 10 Vectors bil 2 sets 5 reps with 3 holds, bil UE assist Vectors standing on Rt only 3X3 1 UE only 4 box fwd step ups Rt leading  2 x 10 4 box fwd step downs Lt leading 2X10 Tandem stance on airex BB strip 2X30 each Gait with SPC X 100 feet, SBA Squats mini to chair 10X cues to shift more weight to Rt side   12/05/23 Standing: Heel/toe raises 2 x 10 4 box toe taps 2 x 10 Stagger stance 2 x 30 each no UE assist Stand on foam 3 x 1 minute no UE assist Ambulation in  // bars with mirror for feedback down and back light bilateral UE assist x 5 Squats mini to chair 2 x 10 Sidestepping // bars with bilateral UE assist x 3 Standing hamstring curls with 2# weight 2 x 10   12/03/23: Leg length measurements- equal Discussed equal weight bearing stance Rockerboard: Rt/Lt x 2 min; Df/PF x 2 min Toe tapping on 6in 20x  Bed mobility- encouraged to stop helping Rt LE up Supine- March to knee extended  Unable to complete SLR- complete with knee flexed to 60 degrees Sidelying:  Clam 10x 5 Sit to stand with increased Rt weight bearing elevated mat height to 20 in Standing reaching to Rt  side to increased Rt LE weightbearing EOS pt able to lift LE onto mat   PATIENT EDUCATION:  Education details: Patient educated on exam findings, POC, scope of PT, HEP, and what to expect next visit; review of hip precautions. Person educated: Patient Education method: Explanation, Demonstration, and Handouts Education comprehension: verbalized understanding, returned demonstration, verbal cues required, and tactile cues required   HOME EXERCISE PROGRAM: Access Code: [email or web address] URL: [email or web address] Date: 11/14/2023 Prepared by: [facility] - Rehab  Exercises - Seated Long Arc Quad  - 2 x daily - 7 x weekly - 2 sets - 10 reps - Seated Heel Toe Raises  - 2 x daily - 7 x weekly - 2 sets - 10 reps - Sit to Stand with Armchair  - 2 x daily - 7 x weekly - 1 sets - 5 reps - Heel Raises with Counter Support  - 2 x daily - 7 x weekly - 2 sets - 10 reps - Standing March with Counter Support  - 2 x daily - 7 x weekly - 2 sets - 10 reps - Standing Knee Flexion with  Counter Support  - 2 x daily - 7 x weekly - 2 sets - 10 reps - Supine Quad Set  - 2 x daily - 7 x weekly - 1 sets - 10 reps - 5 sec hold - Hooklying Gluteal Sets  - 1 x daily - 7 x weekly - 1 sets - 10 reps - 5 sec hold  ASSESSMENT:  CLINICAL IMPRESSION:Progress note today.  Patient with good improvement with all objective testing today and on LEFS.  Patient will benefit from continued skilled PT services to address remaining unmet and partially met goals.  Patient with noted weight shift to the left with sit to stands; cued for more awareness.  Patient still with difficultly getting her right leg into and out of the car so added seated hip flexion and abduction and return back over a 6 block to mimic that motion.  Added stagger stance weight shifts to work on improved balance with gait type motion.   Pt will continue to benefit from continued skilled therapy services to address deficits and promote return to optimal function.       Eval:Patient is a 28 y.o. female who was seen today for physical therapy evaluation and treatment for s/p right THA after a fall and hip fracture; Patient demonstrates muscle weakness, reduced ROM, and fascial restrictions which are likely contributing to symptoms of pain and are negatively impacting patient ability to perform ADLs and functional mobility tasks. Patient will benefit from skilled physical therapy services to address these deficits to reduce pain and improve level of function with ADLs and functional mobility tasks.   OBJECTIVE IMPAIRMENTS: Abnormal gait, decreased activity tolerance, decreased balance, difficulty walking, decreased strength, increased edema, increased fascial restrictions, impaired perceived functional ability, and pain.   ACTIVITY LIMITATIONS: carrying, lifting, bending, sitting, standing, squatting, sleeping, stairs, transfers, bed mobility, bathing, toileting, dressing, and locomotion level  PARTICIPATION LIMITATIONS: meal prep, cleaning, laundry, driving, shopping, and community activity  REHAB POTENTIAL: Good  CLINICAL DECISION MAKING: Evolving/moderate complexity  EVALUATION COMPLEXITY: Moderate   GOALS: Goals reviewed with patient? No  SHORT TERM GOALS: Target date: 12/06/2023 patient will be independent with initial HEP  Baseline: Goal status: met  2.  Patient will report 50% improvement overall  Baseline:  Goal status: in progress   3.  Patient will be able to state 3/3 hip precautions independently; (no hip flexion past 90, no crossing legs; no IR of hip) Baseline:  Goal status: anterior approach so not applicable   LONG TERM GOALS: Target date: 01/30/2024  Patient will be independent in self management strategies to improve quality of life and functional outcomes.  Baseline:  Goal status: in progress  2.  Patient will report 70% improvement overall  Baseline:  Goal status: in progress  3.  Patient will improve LEFS score  by 30  points to demonstrate improved perceived function  Baseline: 13/80; 12/19/23 25/80 (12 point improvement) Goal status: in progress  4.  Patient will improve TUG score to 12 sec or less to demonstrate decreased fall risk and improved functional mobility Baseline: 21.79 sec; 14.36 sec 12/19/23 Goal status: in progress   5.  Patient will improve 5 times sit to stand score to 12 sec or less to demonstrate improved functional mobility and increased leg strength.    Baseline: 19.16 sec; 14.28 sec on 12/19/23 Goal status: in progress    PLAN:  PT FREQUENCY: 2x/week  PT DURATION: 4 weeks  PLANNED INTERVENTIONS: 97164- PT Re-evaluation, 97110-Therapeutic exercises, 97530- Therapeutic activity,  02887- Neuromuscular re-education, [phone]- Self Care, [id number]- Manual therapy, [id number]- Gait training, [phone]- Orthotic Fit/training, [id number]- Canalith repositioning, [id number]- Aquatic Therapy, [id number]- Splinting, [id number]- Wound care (first 20 sq cm), 97598- Wound care (each additional 20 sq cm)Patient/Family education, Balance training, Stair training, Taping, Dry Needling, Joint mobilization, Joint manipulation, Spinal manipulation, Spinal mobilization, Scar mobilization, and DME instructions.   PLAN FOR NEXT SESSION:  no hip precautions per MD.  Continue to progress Rt LE strengthening; gait training, balance; stairs.  continue with gait LRAD.  Next session begin with either nustep or bike per pt request. Extend 2 week 4 to address remaining unmet and partially met goal.    3:37 PM, 12/19/23 Audree Schrecengost Small Lilyth Lawyer MPT [facility] [place] [phone] Ph:[phone]

## 2023-12-23 ENCOUNTER — Ambulatory Visit (HOSPITAL_COMMUNITY): Admitting: Physical Therapy

## 2023-12-23 DIAGNOSIS — M25551 Pain in right hip: Secondary | ICD-10-CM

## 2023-12-23 DIAGNOSIS — R262 Difficulty in walking, not elsewhere classified: Secondary | ICD-10-CM | POA: Diagnosis not present

## 2023-12-23 DIAGNOSIS — Z96641 Presence of right artificial hip joint: Secondary | ICD-10-CM

## 2023-12-23 NOTE — Therapy (Signed)
 OUTPATIENT PHYSICAL THERAPY LOWER EXTREMITY TREATMENT   Patient Name: Jacqueline Koch MRN: [id number] DOB:09/08/1995, 28 y.o., female Today's Date: 12/23/2023  END OF SESSION:  PT End of Session - 12/23/23 1002     Visit Number 11    Number of Visits 20    Date for Recertification  01/16/24    Authorization Type [place] Medicaid [employer]    Authorization Time Period 12 visits from 10/10 to 02/12/23    Authorization - Number of Visits --    Progress Note Due on Visit 20    PT Start Time 0945    PT Stop Time 1025    PT Time Calculation (min) 40 min    Activity Tolerance Patient tolerated treatment well    Behavior During Therapy [facility] for tasks assessed/performed              Past Medical History:  Diagnosis Date   Chlamydia infection 01/20/2013   Closed nondisplaced fracture of shaft of left clavicle 03/30/2020   Kidney stone    Nexplanon  insertion 08/22/2015   08/22/2015       12/02/18 removal & reinsertion     Nexplanon  removal 06/09/2014   Nondisplaced fracture of body of left scapula with routine healing 03/30/2020   Syncope    Past Surgical History:  Procedure Laterality Date   TOTAL HIP ARTHROPLASTY Right 11/03/2023   Procedure: ARTHROPLASTY, HIP, TOTAL, ANTERIOR APPROACH VS POSSIBLE SCREW FIXATION;  Surgeon: Kendal Franky SQUIBB, MD;  Location: [facility] OR;  Service: Orthopedics;  Laterality: Right;   Patient Active Problem List   Diagnosis Date Noted   Acute postoperative anemia due to expected blood loss 11/04/2023   S/P total right hip arthroplasty - 11-04-2023 by Dr. Kendal 11/03/2023   Closed right hip fracture (HCC) 11/01/2023   Marijuana use 12/08/2014   Hearing deficit 12/01/2012   Scoliosis 11/02/2010    PCP: no PCP  REFERRING PROVIDER: Kendal Franky SQUIBB, MD  REFERRING DIAG: s/p Right THA  THERAPY DIAG:  Difficulty in walking, not elsewhere classified  S/P total right hip arthroplasty  Pain in right hip  Rationale for Evaluation and Treatment:  Rehabilitation  ONSET DATE: 11/03/23  SUBJECTIVE:   SUBJECTIVE STATEMENT: Pt states she was able to get in the car today without using her hand to help lift Rt LE and also been getting into the bed without using her UE's to assist.  Walking at home with [facility] but arrives RW.   Eval:Fell Friday 9/27 in [employer]; slipped in water; almost did a split; was unable to get up; went via ambulance to [facility] then to [facility]; surgery done 9/28 discharged home on Monday.  PERTINENT HISTORY: Here previously for therapy prior to fall PAIN:  Are you having pain? Yes: NPRS scale: 3/10 with weight bearing only, 0/10 without Pain location: right hip Pain description: sore/stiff Aggravating factors: just sitting prolonged Relieving factors: elevating right leg; ice packs; pain medication  PRECAUTIONS: Anterior hip From Dr Kendal through staff messenger 12/17/23:  good morning, I just saw this patient and she's doing well. She has no restrictions from my standpoint. She never had any hip precautions since we went anterior. Reach out with any other questions    WEIGHT BEARING RESTRICTIONS: No  FALLS:  Has patient fallen in last 6 months? Yes. Number of falls 1   OCCUPATION:   PLOF: Independent  PATIENT GOALS: walk without the walker  NEXT MD VISIT: next Tuesday 10/14  OBJECTIVE:  Note: Objective measures were completed at  Evaluation unless otherwise noted.  DIAGNOSTIC FINDINGS:   PATIENT SURVEYS:  LEFS  Extreme difficulty/unable (0), Quite a bit of difficulty (1), Moderate difficulty (2), Little difficulty (3), No difficulty (4) Survey date:    Any of your usual work, housework or school activities   2. Usual hobbies, recreational or sporting activities   3. Getting into/out of the bath   4. Walking between rooms   5. Putting on socks/shoes   6. Squatting    7. Lifting an object, like a bag of groceries from the floor   8. Performing light activities around your home   9. Performing  heavy activities around your home   10. Getting into/out of a car   11. Walking 2 blocks   12. Walking 1 mile   13. Going up/down 10 stairs (1 flight)   14. Standing for 1 hour   15.  sitting for 1 hour   16. Running on even ground   17. Running on uneven ground   18. Making sharp turns while running fast   19. Hopping    20. Rolling over in bed   Score total:  13/80; 16.3%     COGNITION: Overall cognitive status: Within functional limits for tasks assessed     SENSATION: WFL  EDEMA:  Yes right hip normal for this time s/p  PALPATION: Swelling right hip   LOWER EXTREMITY MMT:  MMT Right eval Left eval Right 12/19/23 Left 12/19/23  Hip flexion N/t  3- 5  Hip extension      Hip abduction      Hip adduction      Hip internal rotation      Hip external rotation      Knee flexion      Knee extension 3- 5 4-* 5  Ankle dorsiflexion 5 5 5 5   Ankle plantarflexion      Ankle inversion      Ankle eversion       (Blank rows = not tested)   FUNCTIONAL TESTS:  5 times sit to stand: 19.16 sec Timed up and go (TUG): 21.79 sec with RW  GAIT: Distance walked: 60 ft in clinic Assistive device utilized: Walker - 2 wheeled Level of assistance: SBA Comments: antalgic gait decreased stance right lower extremity                                                                                                                                TREATMENT DATE:  12/23/23 Nustep seat 6 lvl 4 x 5' dynamic warm up Standing: in // with bil UE assist Heel/toe raises 20X each on incline/decline Vectors bil 2 sets 5 reps with 3 holds, bil UE assist Forward lunges onto 6 step no UE 2X10 Each side Rt toe tap onto 6 step 2X10 4 box fwd step ups Rt leading  x 10 with only 1 UE assist (took increased time) Rockerboard balancing without UE X 2 minutes  12/19/23 Nustep seat 6 lvl 2 x 5' dynamic warm up Progress note LEFS 25/80 31.3% TUG 14.36 sec with RW 5 time sit to stand 14.28  sec MMT's see above Seated right hip flexion and abduction and back over 6 block to simulate getting leg in and out of car 2 x 5 Standing toe raises no UE assist for quad/hamstring co contract 2 x 15 Stagger stance and rock 2 x 10 Side stepping in // bars down and back x 5   12/17/23 Standing: in // with bil UE assist Heel/toe raises 20X each on incline/decline Vectors bil 2 sets 5 reps with 3 holds, bil UE assist 4 box fwd step ups Rt leading  x 10 with only 1 UE assist (took increased time) Rockerboard balancing without UE X 2 minutes Gait with SPC X  SBA cues for even WB, cadence 4 stair step training with 1 UE assist only, step to gait Sit to stand with Rt closer to body, Lt on foam cushion, no UE assist 10X  12/12/23 Standing: in // with bil UE assist Heel/toe raises 20X each on incline/decline Vectors bil 2 sets 5 reps with 3 holds, bil UE assist Vectors standing on Rt only 3X3 1 UE only 4 box fwd step ups Rt leading  2 x 10 4 box fwd step downs Lt leading 2X10 4 lateral step downs, heel taps 2X10 Side stepping 15ft line with SPC 1RT Gait with SPC X 150 feet, SBA cues for even WB, cadence  PATIENT EDUCATION:  Education details: Patient educated on exam findings, POC, scope of PT, HEP, and what to expect next visit; review of hip precautions. Person educated: Patient Education method: Explanation, Demonstration, and Handouts Education comprehension: verbalized understanding, returned demonstration, verbal cues required, and tactile cues required   HOME EXERCISE PROGRAM: Access Code: [email or web address] URL: [email or web address] Date: 11/14/2023 Prepared by: [facility] - Rehab  Exercises - Seated Long Arc Quad  - 2 x daily - 7 x weekly - 2 sets - 10 reps - Seated Heel Toe Raises  - 2 x daily - 7 x weekly - 2 sets - 10 reps - Sit to Stand with Armchair  - 2 x daily - 7 x weekly - 1 sets - 5 reps - Heel Raises with Counter Support  - 2 x daily - 7 x weekly - 2 sets  - 10 reps - Standing March with Counter Support  - 2 x daily - 7 x weekly - 2 sets - 10 reps - Standing Knee Flexion with Counter Support  - 2 x daily - 7 x weekly - 2 sets - 10 reps - Supine Quad Set  - 2 x daily - 7 x weekly - 1 sets - 10 reps - 5 sec hold - Hooklying Gluteal Sets  - 1 x daily - 7 x weekly - 1 sets - 10 reps - 5 sec hold  ASSESSMENT:  CLINICAL IMPRESSION: Continued with focus on improving gait and functional strength of Rt LE, specifically hip flexion and single leg stance activities.  Pt still tends to assist with UE's to lift her Rt LE, however reports she has lessened this and is able to do it some without assist.  Static hold of rockerboard remains challenging without UE assist.  Encouraged to discontinue walker all together as she is able to ambulate with SPC at this time.  Pt will continue to benefit from continued skilled therapy services to address deficits and promote return to optimal function.  Eval:Patient is a 28 y.o. female who was seen today for physical therapy evaluation and treatment for s/p right THA after a fall and hip fracture; Patient demonstrates muscle weakness, reduced ROM, and fascial restrictions which are likely contributing to symptoms of pain and are negatively impacting patient ability to perform ADLs and functional mobility tasks. Patient will benefit from skilled physical therapy services to address these deficits to reduce pain and improve level of function with ADLs and functional mobility tasks.   OBJECTIVE IMPAIRMENTS: Abnormal gait, decreased activity tolerance, decreased balance, difficulty walking, decreased strength, increased edema, increased fascial restrictions, impaired perceived functional ability, and pain.   ACTIVITY LIMITATIONS: carrying, lifting, bending, sitting, standing, squatting, sleeping, stairs, transfers, bed mobility, bathing, toileting, dressing, and locomotion level  PARTICIPATION LIMITATIONS: meal prep, cleaning,  laundry, driving, shopping, and community activity  REHAB POTENTIAL: Good  CLINICAL DECISION MAKING: Evolving/moderate complexity  EVALUATION COMPLEXITY: Moderate   GOALS: Goals reviewed with patient? No  SHORT TERM GOALS: Target date: 12/06/2023 patient will be independent with initial HEP  Baseline: Goal status: met  2.  Patient will report 50% improvement overall  Baseline:  Goal status: in progress   3.  Patient will be able to state 3/3 hip precautions independently; (no hip flexion past 90, no crossing legs; no IR of hip) Baseline:  Goal status: anterior approach so not applicable   LONG TERM GOALS: Target date: 01/30/2024  Patient will be independent in self management strategies to improve quality of life and functional outcomes.  Baseline:  Goal status: in progress  2.  Patient will report 70% improvement overall  Baseline:  Goal status: in progress  3.  Patient will improve LEFS score by 30  points to demonstrate improved perceived function  Baseline: 13/80; 12/19/23 25/80 (12 point improvement) Goal status: in progress  4.  Patient will improve TUG score to 12 sec or less to demonstrate decreased fall risk and improved functional mobility Baseline: 21.79 sec; 14.36 sec 12/19/23 Goal status: in progress   5.  Patient will improve 5 times sit to stand score to 12 sec or less to demonstrate improved functional mobility and increased leg strength.    Baseline: 19.16 sec; 14.28 sec on 12/19/23 Goal status: in progress    PLAN:  PT FREQUENCY: 2x/week  PT DURATION: 4 weeks  PLANNED INTERVENTIONS: 97164- PT Re-evaluation, 97110-Therapeutic exercises, 97530- Therapeutic activity, 97112- Neuromuscular re-education, 97535- Self Care, [id number]- Manual therapy, [phone]- Gait training, [phone]- Orthotic Fit/training, [phone]- Canalith repositioning, [id number]- Aquatic Therapy, [phone]- Splinting, [phone]- Wound care (first 20 sq cm), 97598- Wound care (each additional 20 sq  cm)Patient/Family education, Balance training, Stair training, Taping, Dry Needling, Joint mobilization, Joint manipulation, Spinal manipulation, Spinal mobilization, Scar mobilization, and DME instructions.   PLAN FOR NEXT SESSION:  no hip precautions per MD.  Continue to progress Rt LE strengthening; gait training, balance; stairs.  continue with gait LRAD.  Next session begin with either nustep or bike per pt request. Extend 2 week 4 to address remaining unmet and partially met goal.    10:13 AM, 12/23/23 Greig KATHEE Fuse, PTA/CLT [facility] Ph: [phone]

## 2023-12-27 ENCOUNTER — Ambulatory Visit (HOSPITAL_COMMUNITY)

## 2023-12-27 ENCOUNTER — Encounter (HOSPITAL_COMMUNITY): Payer: Self-pay

## 2023-12-27 DIAGNOSIS — R262 Difficulty in walking, not elsewhere classified: Secondary | ICD-10-CM | POA: Diagnosis not present

## 2023-12-27 DIAGNOSIS — Z96641 Presence of right artificial hip joint: Secondary | ICD-10-CM | POA: Diagnosis not present

## 2023-12-27 DIAGNOSIS — M25551 Pain in right hip: Secondary | ICD-10-CM | POA: Diagnosis not present

## 2023-12-27 NOTE — Therapy (Signed)
 OUTPATIENT PHYSICAL THERAPY LOWER EXTREMITY TREATMENT   Patient Name: Jacqueline Koch MRN: [id number] DOB:23-Apr-1995, 28 y.o., female Today's Date: 12/27/2023  END OF SESSION:  PT End of Session - 12/27/23 0954     Visit Number 12    Number of Visits 20    Date for Recertification  01/16/24    Authorization Type  Medicaid [employer]    Authorization Time Period 12 visits from 10/10 to 02/12/23; requested more on 12/27/23    Authorization - Visit Number 12    Authorization - Number of Visits 12    Progress Note Due on Visit 20    PT Start Time 0950    PT Stop Time 1032    PT Time Calculation (min) 42 min    Activity Tolerance Patient tolerated treatment well    Behavior During Therapy [facility] for tasks assessed/performed              Past Medical History:  Diagnosis Date   Chlamydia infection 01/20/2013   Closed nondisplaced fracture of shaft of left clavicle 03/30/2020   Kidney stone    Nexplanon  insertion 08/22/2015   08/22/2015       12/02/18 removal & reinsertion     Nexplanon  removal 06/09/2014   Nondisplaced fracture of body of left scapula with routine healing 03/30/2020   Syncope    Past Surgical History:  Procedure Laterality Date   TOTAL HIP ARTHROPLASTY Right 11/03/2023   Procedure: ARTHROPLASTY, HIP, TOTAL, ANTERIOR APPROACH VS POSSIBLE SCREW FIXATION;  Surgeon: Kendal Franky SQUIBB, MD;  Location: [facility] OR;  Service: Orthopedics;  Laterality: Right;   Patient Active Problem List   Diagnosis Date Noted   Acute postoperative anemia due to expected blood loss 11/04/2023   S/P total right hip arthroplasty - 11-04-2023 by Dr. Kendal 11/03/2023   Closed right hip fracture (HCC) 11/01/2023   Marijuana use 12/08/2014   Hearing deficit 12/01/2012   Scoliosis 11/02/2010    PCP: no PCP  REFERRING PROVIDER: Kendal Franky SQUIBB, MD  REFERRING DIAG: s/p Right THA  THERAPY DIAG:  Difficulty in walking, not elsewhere classified  S/P total right hip arthroplasty  Pain  in right hip  Rationale for Evaluation and Treatment: Rehabilitation  ONSET DATE: 11/03/23  SUBJECTIVE:   SUBJECTIVE STATEMENT: Pt arrives ambulating with QC. Pt reports 5/10 pain today, pain more so in R knee. Reports knee was swollen the past 2 days since she's been using QC. but its better now. Reports she drove this morning with no issues.    Eval:Fell Friday 9/27 in [employer]; slipped in water; almost did a split; was unable to get up; went via ambulance to [facility] then to [facility]; surgery done 9/28 discharged home on Monday.  PERTINENT HISTORY: Here previously for therapy prior to fall PAIN:  Are you having pain? Yes: NPRS scale: 3/10 with weight bearing only, 0/10 without Pain location: right hip Pain description: sore/stiff Aggravating factors: just sitting prolonged Relieving factors: elevating right leg; ice packs; pain medication  PRECAUTIONS: Anterior hip From Dr Kendal through staff messenger 12/17/23:  good morning, I just saw this patient and she's doing well. She has no restrictions from my standpoint. She never had any hip precautions since we went anterior. Reach out with any other questions    WEIGHT BEARING RESTRICTIONS: No  FALLS:  Has patient fallen in last 6 months? Yes. Number of falls 1   OCCUPATION:   PLOF: Independent  PATIENT GOALS: walk without the walker  NEXT MD VISIT:  next Tuesday 10/14  OBJECTIVE:  Note: Objective measures were completed at Evaluation unless otherwise noted.  DIAGNOSTIC FINDINGS:   PATIENT SURVEYS:  LEFS  Extreme difficulty/unable (0), Quite a bit of difficulty (1), Moderate difficulty (2), Little difficulty (3), No difficulty (4) Survey date:    Any of your usual work, housework or school activities   2. Usual hobbies, recreational or sporting activities   3. Getting into/out of the bath   4. Walking between rooms   5. Putting on socks/shoes   6. Squatting    7. Lifting an object, like a bag of groceries from  the floor   8. Performing light activities around your home   9. Performing heavy activities around your home   10. Getting into/out of a car   11. Walking 2 blocks   12. Walking 1 mile   13. Going up/down 10 stairs (1 flight)   14. Standing for 1 hour   15.  sitting for 1 hour   16. Running on even ground   17. Running on uneven ground   18. Making sharp turns while running fast   19. Hopping    20. Rolling over in bed   Score total:  13/80; 16.3%     12/19/23: LEFS 25/80 31.3%  COGNITION: Overall cognitive status: Within functional limits for tasks assessed     SENSATION: WFL  EDEMA:  Yes right hip normal for this time s/p  PALPATION: Swelling right hip   LOWER EXTREMITY MMT:  MMT Right eval Left eval Right 12/19/23 Left 12/19/23  Hip flexion N/t  3- 5  Hip extension      Hip abduction      Hip adduction      Hip internal rotation      Hip external rotation      Knee flexion      Knee extension 3- 5 4-* 5  Ankle dorsiflexion 5 5 5 5   Ankle plantarflexion      Ankle inversion      Ankle eversion       (Blank rows = not tested)   FUNCTIONAL TESTS:  5 times sit to stand: 19.16 sec Timed up and go (TUG): 21.79 sec with RW  12/19/23: TUG 14.36 sec with RW 5 time sit to stand 14.28 sec  GAIT: Distance walked: 60 ft in clinic Assistive device utilized: Walker - 2 wheeled Level of assistance: SBA Comments: antalgic gait decreased stance right lower extremity                                                                                                                                TREATMENT DATE:  12/27/23: Bike, seat 6, 5', full revolutions STS from chair height, 2x12 Forward foot taps to 6 inch step, 2x10, UE support Lateral step over, 4 inch step, 2x10 Side Stepping in // bars, RTB around knees, down and back 10x Hip vectors, RTB around knees, 10x each  Marching, RTB around knees, 12x each    12/23/23 Nustep seat 6 lvl 4 x 5' dynamic  warm up Standing: in // with bil UE assist Heel/toe raises 20X each on incline/decline Vectors bil 2 sets 5 reps with 3 holds, bil UE assist Forward lunges onto 6 step no UE 2X10 Each side Rt toe tap onto 6 step 2X10 4 box fwd step ups Rt leading  x 10 with only 1 UE assist (took increased time) Rockerboard balancing without UE X 2 minutes  12/19/23 Nustep seat 6 lvl 2 x 5' dynamic warm up Progress note LEFS 25/80 31.3% TUG 14.36 sec with RW 5 time sit to stand 14.28 sec MMT's see above Seated right hip flexion and abduction and back over 6 block to simulate getting leg in and out of car 2 x 5 Standing toe raises no UE assist for quad/hamstring co contract 2 x 15 Stagger stance and rock 2 x 10 Side stepping in // bars down and back x 5    PATIENT EDUCATION:  Education details: Patient educated on exam findings, POC, scope of PT, HEP, and what to expect next visit; review of hip precautions. Person educated: Patient Education method: Explanation, Demonstration, and Handouts Education comprehension: verbalized understanding, returned demonstration, verbal cues required, and tactile cues required   HOME EXERCISE PROGRAM: Access Code: [email or web address] URL: [email or web address] Date: 11/14/2023 Prepared by: [facility] - Rehab  Exercises - Seated Long Arc Quad  - 2 x daily - 7 x weekly - 2 sets - 10 reps - Seated Heel Toe Raises  - 2 x daily - 7 x weekly - 2 sets - 10 reps - Sit to Stand with Armchair  - 2 x daily - 7 x weekly - 1 sets - 5 reps - Heel Raises with Counter Support  - 2 x daily - 7 x weekly - 2 sets - 10 reps - Standing March with Counter Support  - 2 x daily - 7 x weekly - 2 sets - 10 reps - Standing Knee Flexion with Counter Support  - 2 x daily - 7 x weekly - 2 sets - 10 reps - Supine Quad Set  - 2 x daily - 7 x weekly - 1 sets - 10 reps - 5 sec hold - Hooklying Gluteal Sets  - 1 x daily - 7 x weekly - 1 sets - 10 reps - 5 sec hold  ASSESSMENT:  CLINICAL  IMPRESSION: Patient continues to demonstrate increased RLE pain, decreased strength and ROM. Began session on bike for ROM and strengthening. Pt able to complete full rotations without issue. Followed with LE strengthening. Added lateral stepping in this session with pt tolerating well, just reports of slight discomfort. Added resistance to side steps and hip vectors this date. Pt reports most difficulty with hip vectors when the RLE is the stance leg and marching with resistance. Reports a little pain at end of available range. PT tolerates session well overall. Pt will continue to benefit from continued skilled therapy services to address deficits and promote return to optimal function. More visits requested this date.      Eval:Patient is a 28 y.o. female who was seen today for physical therapy evaluation and treatment for s/p right THA after a fall and hip fracture; Patient demonstrates muscle weakness, reduced ROM, and fascial restrictions which are likely contributing to symptoms of pain and are negatively impacting patient ability to perform ADLs and functional mobility tasks. Patient will benefit from skilled physical therapy services  to address these deficits to reduce pain and improve level of function with ADLs and functional mobility tasks.   OBJECTIVE IMPAIRMENTS: Abnormal gait, decreased activity tolerance, decreased balance, difficulty walking, decreased strength, increased edema, increased fascial restrictions, impaired perceived functional ability, and pain.   ACTIVITY LIMITATIONS: carrying, lifting, bending, sitting, standing, squatting, sleeping, stairs, transfers, bed mobility, bathing, toileting, dressing, and locomotion level  PARTICIPATION LIMITATIONS: meal prep, cleaning, laundry, driving, shopping, and community activity  REHAB POTENTIAL: Good  CLINICAL DECISION MAKING: Evolving/moderate complexity  EVALUATION COMPLEXITY: Moderate   GOALS: Goals reviewed with patient?  No  SHORT TERM GOALS: Target date: 12/06/2023 patient will be independent with initial HEP  Baseline: Goal status: met  2.  Patient will report 50% improvement overall  Baseline:  Goal status: in progress   3.  Patient will be able to state 3/3 hip precautions independently; (no hip flexion past 90, no crossing legs; no IR of hip) Baseline:  Goal status: anterior approach so not applicable   LONG TERM GOALS: Target date: 01/30/2024  Patient will be independent in self management strategies to improve quality of life and functional outcomes.  Baseline:  Goal status: in progress  2.  Patient will report 70% improvement overall  Baseline:  Goal status: in progress  3.  Patient will improve LEFS score by 30  points to demonstrate improved perceived function  Baseline: 13/80; 12/19/23 25/80 (12 point improvement) Goal status: in progress  4.  Patient will improve TUG score to 12 sec or less to demonstrate decreased fall risk and improved functional mobility Baseline: 21.79 sec; 14.36 sec 12/19/23 Goal status: in progress   5.  Patient will improve 5 times sit to stand score to 12 sec or less to demonstrate improved functional mobility and increased leg strength.    Baseline: 19.16 sec; 14.28 sec on 12/19/23 Goal status: in progress    PLAN:  PT FREQUENCY: 2x/week  PT DURATION: 4 weeks  PLANNED INTERVENTIONS: 97164- PT Re-evaluation, 97110-Therapeutic exercises, 97530- Therapeutic activity, 97112- Neuromuscular re-education, 97535- Self Care, [id number]- Manual therapy, [phone]- Gait training, [phone]- Orthotic Fit/training, [phone]- Canalith repositioning, [id number]- Aquatic Therapy, [phone]- Splinting, [phone]- Wound care (first 20 sq cm), 97598- Wound care (each additional 20 sq cm)Patient/Family education, Balance training, Stair training, Taping, Dry Needling, Joint mobilization, Joint manipulation, Spinal manipulation, Spinal mobilization, Scar mobilization, and DME  instructions.   PLAN FOR NEXT SESSION:  no hip precautions per MD.  Continue to progress Rt LE strengthening; gait training, balance; stairs.  continue with gait LRAD.  Next session begin with either nustep or bike per pt request. Extend 2 week 4 to address remaining unmet and partially met goal.  More visits requested on 12/27/23    10:35 AM, 12/27/23 Rosaria Settler, PT, DPT  Rehabilitation - [place]   Managed Medicaid Authorization Request Treatment Start Date: 22-Nov-2023  Visit Dx Codes:  R26.2 Z96.641 M25.551  Functional Tool Score:   On 12/19/23:  LEFS 25/80 31.3% TUG 14.36 sec with RW 5 time sit to stand 14.28 sec  For all possible CPT codes, reference the Planned Interventions line above.     Check all conditions that are expected to impact treatment: {Conditions expected to impact treatment:None of these apply   If treatment provided at initial evaluation, no treatment charged due to lack of authorization.

## 2024-01-01 ENCOUNTER — Encounter (HOSPITAL_COMMUNITY): Payer: Self-pay

## 2024-01-01 ENCOUNTER — Ambulatory Visit (HOSPITAL_COMMUNITY)

## 2024-01-01 DIAGNOSIS — Z96641 Presence of right artificial hip joint: Secondary | ICD-10-CM

## 2024-01-01 DIAGNOSIS — R262 Difficulty in walking, not elsewhere classified: Secondary | ICD-10-CM

## 2024-01-01 DIAGNOSIS — M25551 Pain in right hip: Secondary | ICD-10-CM | POA: Diagnosis not present

## 2024-01-01 NOTE — Therapy (Signed)
 OUTPATIENT PHYSICAL THERAPY LOWER EXTREMITY TREATMENT   Patient Name: Jacqueline Koch MRN: [id number] DOB:1995/04/12, 28 y.o., female Today's Date: 01/01/2024  END OF SESSION:  PT End of Session - 01/01/24 1325     Visit Number 13    Number of Visits 20    Date for Recertification  01/16/24    Authorization Type [place] [employer]    Authorization Time Period 12/27/23-01/25/24    Authorization - Visit Number 1    Authorization - Number of Visits 4    Progress Note Due on Visit 20    PT Start Time 1327    PT Stop Time 1420    PT Time Calculation (min) 53 min    Activity Tolerance Patient tolerated treatment well    Behavior During Therapy [facility] for tasks assessed/performed               Past Medical History:  Diagnosis Date   Chlamydia infection 01/20/2013   Closed nondisplaced fracture of shaft of left clavicle 03/30/2020   Kidney stone    Nexplanon  insertion 08/22/2015   08/22/2015       12/02/18 removal & reinsertion     Nexplanon  removal 06/09/2014   Nondisplaced fracture of body of left scapula with routine healing 03/30/2020   Syncope    Past Surgical History:  Procedure Laterality Date   TOTAL HIP ARTHROPLASTY Right 11/03/2023   Procedure: ARTHROPLASTY, HIP, TOTAL, ANTERIOR APPROACH VS POSSIBLE SCREW FIXATION;  Surgeon: Kendal Franky SQUIBB, MD;  Location: [facility] OR;  Service: Orthopedics;  Laterality: Right;   Patient Active Problem List   Diagnosis Date Noted   Acute postoperative anemia due to expected blood loss 11/04/2023   S/P total right hip arthroplasty - 11-04-2023 by Dr. Kendal 11/03/2023   Closed right hip fracture (HCC) 11/01/2023   Marijuana use 12/08/2014   Hearing deficit 12/01/2012   Scoliosis 11/02/2010    PCP: no PCP  REFERRING PROVIDER: Kendal Franky SQUIBB, MD  REFERRING DIAG: s/p Right THA  THERAPY DIAG:  Difficulty in walking, not elsewhere classified  S/P total right hip arthroplasty  Pain in right hip  Rationale for Evaluation  and Treatment: Rehabilitation  ONSET DATE: 11/03/23  SUBJECTIVE:   SUBJECTIVE STATEMENT: Patient reports that she feels good today. She is not hurting right now.    Eval:Fell Friday 9/27 in [employer]; slipped in water; almost did a split; was unable to get up; went via ambulance to [facility] then to [facility]; surgery done 9/28 discharged home on Monday.  PERTINENT HISTORY: Here previously for therapy prior to fall PAIN:  Are you having pain? Yes: NPRS scale: 0/10 Pain location: right hip Pain description: sore/stiff Aggravating factors: just sitting prolonged Relieving factors: elevating right leg; ice packs; pain medication  PRECAUTIONS: Anterior hip From Dr Kendal through staff messenger 12/17/23:  good morning, I just saw this patient and she's doing well. She has no restrictions from my standpoint. She never had any hip precautions since we went anterior. Reach out with any other questions    WEIGHT BEARING RESTRICTIONS: No  FALLS:  Has patient fallen in last 6 months? Yes. Number of falls 1   OCCUPATION:   PLOF: Independent  PATIENT GOALS: walk without the walker  NEXT MD VISIT: next Tuesday 10/14  OBJECTIVE:  Note: Objective measures were completed at Evaluation unless otherwise noted.  DIAGNOSTIC FINDINGS:   PATIENT SURVEYS:  LEFS  Extreme difficulty/unable (0), Quite a bit of difficulty (1), Moderate difficulty (2), Little difficulty (3), No  difficulty (4) Survey date:    Any of your usual work, housework or school activities   2. Usual hobbies, recreational or sporting activities   3. Getting into/out of the bath   4. Walking between rooms   5. Putting on socks/shoes   6. Squatting    7. Lifting an object, like a bag of groceries from the floor   8. Performing light activities around your home   9. Performing heavy activities around your home   10. Getting into/out of a car   11. Walking 2 blocks   12. Walking 1 mile   13. Going up/down 10 stairs (1  flight)   14. Standing for 1 hour   15.  sitting for 1 hour   16. Running on even ground   17. Running on uneven ground   18. Making sharp turns while running fast   19. Hopping    20. Rolling over in bed   Score total:  13/80; 16.3%     12/19/23: LEFS 25/80 31.3%  COGNITION: Overall cognitive status: Within functional limits for tasks assessed     SENSATION: WFL  EDEMA:  Yes right hip normal for this time s/p  PALPATION: Swelling right hip   LOWER EXTREMITY MMT:  MMT Right eval Left eval Right 12/19/23 Left 12/19/23  Hip flexion N/t  3- 5  Hip extension      Hip abduction      Hip adduction      Hip internal rotation      Hip external rotation      Knee flexion      Knee extension 3- 5 4-* 5  Ankle dorsiflexion 5 5 5 5   Ankle plantarflexion      Ankle inversion      Ankle eversion       (Blank rows = not tested)   FUNCTIONAL TESTS:  5 times sit to stand: 19.16 sec Timed up and go (TUG): 21.79 sec with RW  12/19/23: TUG 14.36 sec with RW 5 time sit to stand 14.28 sec  GAIT: Distance walked: 60 ft in clinic Assistive device utilized: Walker - 2 wheeled Level of assistance: SBA Comments: antalgic gait decreased stance right lower extremity                                                                                                                                TREATMENT DATE:                                    01/01/24 EXERCISE LOG  Exercise Repetitions and Resistance Comments  Recumbent bike  L3 x 5 minutes  Full revolutions   Rocker board  2 minutes  AP; Infrequent UE support from parallel bars   Tandem stance on foam   3 x 30 seconds each  Intermittent UE support from parallel bars  Sit to stand   20 reps  15 reps w/ tidal tank at chest   Stepping over hurdles  2 laps each  Leading with each LE; 2 low hurdles and 1 high hurdle; BUE support from parallel bars   Side stepping over hurdles  3 laps in parallel bars  BUE support from  parallel bars; 2 low hurdles and 1 high hurdle  Stepping strategy  25 reps each     Gait training  For heel-toe pattern  Without SPC in parallel bars; with SPC without parallel bars    Blank cell = exercise not performed today   12/27/23: Bike, seat 6, 5', full revolutions STS from chair height, 2x12 Forward foot taps to 6 inch step, 2x10, UE support Lateral step over, 4 inch step, 2x10 Side Stepping in // bars, RTB around knees, down and back 10x Hip vectors, RTB around knees, 10x each  Marching, RTB around knees, 12x each    12/23/23 Nustep seat 6 lvl 4 x 5' dynamic warm up Standing: in // with bil UE assist Heel/toe raises 20X each on incline/decline Vectors bil 2 sets 5 reps with 3 holds, bil UE assist Forward lunges onto 6 step no UE 2X10 Each side Rt toe tap onto 6 step 2X10 4 box fwd step ups Rt leading  x 10 with only 1 UE assist (took increased time) Rockerboard balancing without UE X 2 minutes  12/19/23 Nustep seat 6 lvl 2 x 5' dynamic warm up Progress note LEFS 25/80 31.3% TUG 14.36 sec with RW 5 time sit to stand 14.28 sec MMT's see above Seated right hip flexion and abduction and back over 6 block to simulate getting leg in and out of car 2 x 5 Standing toe raises no UE assist for quad/hamstring co contract 2 x 15 Stagger stance and rock 2 x 10 Side stepping in // bars down and back x 5    PATIENT EDUCATION:  Education details: Patient educated on exam findings, POC, scope of PT, HEP, and what to expect next visit; review of hip precautions. Person educated: Patient Education method: Explanation, Demonstration, and Handouts Education comprehension: verbalized understanding, returned demonstration, verbal cues required, and tactile cues required   HOME EXERCISE PROGRAM: Access Code: [email or web address] URL: [email or web address] Date: 11/14/2023 Prepared by: [facility] - Rehab  Exercises - Seated Long Arc Quad  - 2 x daily - 7 x weekly - 2 sets - 10  reps - Seated Heel Toe Raises  - 2 x daily - 7 x weekly - 2 sets - 10 reps - Sit to Stand with Armchair  - 2 x daily - 7 x weekly - 1 sets - 5 reps - Heel Raises with Counter Support  - 2 x daily - 7 x weekly - 2 sets - 10 reps - Standing March with Counter Support  - 2 x daily - 7 x weekly - 2 sets - 10 reps - Standing Knee Flexion with Counter Support  - 2 x daily - 7 x weekly - 2 sets - 10 reps - Supine Quad Set  - 2 x daily - 7 x weekly - 1 sets - 10 reps - 5 sec hold - Hooklying Gluteal Sets  - 1 x daily - 7 x weekly - 1 sets - 10 reps - 5 sec hold  ASSESSMENT:  CLINICAL IMPRESSION: Today's treatment focused on improved gait mechanics to improve her functional mobility. She required moderate multimodal cueing with the stepping strategy and gait training to facilitate  a heel to toe gait pattern. Her gait pattern was also able to be improved when she was educated on slow and controlled mobility. She exhibited slow and labored movement when stepping over hurdles with side stepping over the hurdles being the most difficult. She experienced no significant increase in pain or discomfort with any of today's interventions. She reported feeling good upon the conclusion of treatment. Patient continues to require skilled physical therapy to address her remaining impairments to return to her prior level of function.      Eval:Patient is a 28 y.o. female who was seen today for physical therapy evaluation and treatment for s/p right THA after a fall and hip fracture; Patient demonstrates muscle weakness, reduced ROM, and fascial restrictions which are likely contributing to symptoms of pain and are negatively impacting patient ability to perform ADLs and functional mobility tasks. Patient will benefit from skilled physical therapy services to address these deficits to reduce pain and improve level of function with ADLs and functional mobility tasks.   OBJECTIVE IMPAIRMENTS: Abnormal gait, decreased activity  tolerance, decreased balance, difficulty walking, decreased strength, increased edema, increased fascial restrictions, impaired perceived functional ability, and pain.   ACTIVITY LIMITATIONS: carrying, lifting, bending, sitting, standing, squatting, sleeping, stairs, transfers, bed mobility, bathing, toileting, dressing, and locomotion level  PARTICIPATION LIMITATIONS: meal prep, cleaning, laundry, driving, shopping, and community activity  REHAB POTENTIAL: Good  CLINICAL DECISION MAKING: Evolving/moderate complexity  EVALUATION COMPLEXITY: Moderate   GOALS: Goals reviewed with patient? No  SHORT TERM GOALS: Target date: 12/06/2023 patient will be independent with initial HEP  Baseline: Goal status: met  2.  Patient will report 50% improvement overall  Baseline:  Goal status: in progress   3.  Patient will be able to state 3/3 hip precautions independently; (no hip flexion past 90, no crossing legs; no IR of hip) Baseline:  Goal status: anterior approach so not applicable   LONG TERM GOALS: Target date: 01/30/2024  Patient will be independent in self management strategies to improve quality of life and functional outcomes.  Baseline:  Goal status: in progress  2.  Patient will report 70% improvement overall  Baseline:  Goal status: in progress  3.  Patient will improve LEFS score by 30  points to demonstrate improved perceived function  Baseline: 13/80; 12/19/23 25/80 (12 point improvement) Goal status: in progress  4.  Patient will improve TUG score to 12 sec or less to demonstrate decreased fall risk and improved functional mobility Baseline: 21.79 sec; 14.36 sec 12/19/23 Goal status: in progress   5.  Patient will improve 5 times sit to stand score to 12 sec or less to demonstrate improved functional mobility and increased leg strength.    Baseline: 19.16 sec; 14.28 sec on 12/19/23 Goal status: in progress    PLAN:  PT FREQUENCY: 2x/week  PT  DURATION: 4 weeks  PLANNED INTERVENTIONS: 97164- PT Re-evaluation, 97110-Therapeutic exercises, 97530- Therapeutic activity, 97112- Neuromuscular re-education, 97535- Self Care, [id number]- Manual therapy, [phone]- Gait training, [phone]- Orthotic Fit/training, [phone]- Canalith repositioning, [id number]- Aquatic Therapy, [phone]- Splinting, [phone]- Wound care (first 20 sq cm), 97598- Wound care (each additional 20 sq cm)Patient/Family education, Balance training, Stair training, Taping, Dry Needling, Joint mobilization, Joint manipulation, Spinal manipulation, Spinal mobilization, Scar mobilization, and DME instructions.   PLAN FOR NEXT SESSION:  no hip precautions per MD.  Continue to progress Rt LE strengthening; gait training, balance; stairs.  continue with gait LRAD.  Next session begin with either nustep or bike per pt request.  Extend 2 week 4 to address remaining unmet and partially met goal.  More visits requested on 12/27/23   Lacinda Fass, PT, DPT  3:14 PM, 01/01/24

## 2024-01-09 ENCOUNTER — Encounter (HOSPITAL_COMMUNITY): Payer: Self-pay

## 2024-01-09 ENCOUNTER — Ambulatory Visit (HOSPITAL_COMMUNITY)

## 2024-01-09 DIAGNOSIS — M25551 Pain in right hip: Secondary | ICD-10-CM | POA: Diagnosis present

## 2024-01-09 DIAGNOSIS — R262 Difficulty in walking, not elsewhere classified: Secondary | ICD-10-CM | POA: Insufficient documentation

## 2024-01-09 DIAGNOSIS — Z96641 Presence of right artificial hip joint: Secondary | ICD-10-CM | POA: Diagnosis present

## 2024-01-09 NOTE — Therapy (Signed)
 OUTPATIENT PHYSICAL THERAPY LOWER EXTREMITY TREATMENT   Patient Name: Jacqueline Koch MRN: [id number] DOB:29-Apr-1995, 28 y.o., female Today's Date: 01/09/2024  END OF SESSION:  PT End of Session - 01/09/24 1118     Visit Number 14    Number of Visits 20    Date for Recertification  01/16/24    Authorization Type [place] Medicaid [employer]    Authorization Time Period 4 visits approved 12/27/23-01/25/24    Authorization - Visit Number 2    Authorization - Number of Visits 4    Progress Note Due on Visit 20    PT Start Time 1121    PT Stop Time 1200    PT Time Calculation (min) 39 min    Activity Tolerance Patient tolerated treatment well    Behavior During Therapy [facility] for tasks assessed/performed               Past Medical History:  Diagnosis Date   Chlamydia infection 01/20/2013   Closed nondisplaced fracture of shaft of left clavicle 03/30/2020   Kidney stone    Nexplanon  insertion 08/22/2015   08/22/2015       12/02/18 removal & reinsertion     Nexplanon  removal 06/09/2014   Nondisplaced fracture of body of left scapula with routine healing 03/30/2020   Syncope    Past Surgical History:  Procedure Laterality Date   TOTAL HIP ARTHROPLASTY Right 11/03/2023   Procedure: ARTHROPLASTY, HIP, TOTAL, ANTERIOR APPROACH VS POSSIBLE SCREW FIXATION;  Surgeon: Kendal Franky SQUIBB, MD;  Location: [facility] OR;  Service: Orthopedics;  Laterality: Right;   Patient Active Problem List   Diagnosis Date Noted   Acute postoperative anemia due to expected blood loss 11/04/2023   S/P total right hip arthroplasty - 11-04-2023 by Dr. Kendal 11/03/2023   Closed right hip fracture (HCC) 11/01/2023   Marijuana use 12/08/2014   Hearing deficit 12/01/2012   Scoliosis 11/02/2010    PCP: no PCP  REFERRING PROVIDER: Kendal Franky SQUIBB, MD  REFERRING DIAG: s/p Right THA  THERAPY DIAG:  S/P total right hip arthroplasty  Pain in right hip  Difficulty in walking, not elsewhere  classified  Rationale for Evaluation and Treatment: Rehabilitation  ONSET DATE: 11/03/23  SUBJECTIVE:   SUBJECTIVE STATEMENT: Reports she has been walking around the house without AD, continues with cane when out of the house.  Hip is feeling good with no pain and Rt knee is feeling better, maybe a 3/10.  Daughter has a automotive engineer, going to try to go to concert without cane.    Eval:Fell Friday 9/27 in [employer]; slipped in water; almost did a split; was unable to get up; went via ambulance to [facility] then to [facility]; surgery done 9/28 discharged home on Monday.  PERTINENT HISTORY: Here previously for therapy prior to fall PAIN:  Are you having pain? Yes: NPRS scale: 0/10 Pain location: no pain right hip; knee 3/10 Pain description: sore/stiff Aggravating factors: just sitting prolonged Relieving factors: elevating right leg; ice packs; pain medication  PRECAUTIONS: Anterior hip From Dr Kendal through staff messenger 12/17/23:  good morning, I just saw this patient and she's doing well. She has no restrictions from my standpoint. She never had any hip precautions since we went anterior. Reach out with any other questions    WEIGHT BEARING RESTRICTIONS: No  FALLS:  Has patient fallen in last 6 months? Yes. Number of falls 1   OCCUPATION:   PLOF: Independent  PATIENT GOALS: walk without the walker  NEXT  MD VISIT: next Tuesday 10/14  OBJECTIVE:  Note: Objective measures were completed at Evaluation unless otherwise noted.  DIAGNOSTIC FINDINGS:   PATIENT SURVEYS:  LEFS  Extreme difficulty/unable (0), Quite a bit of difficulty (1), Moderate difficulty (2), Little difficulty (3), No difficulty (4) Survey date:    Any of your usual work, housework or school activities   2. Usual hobbies, recreational or sporting activities   3. Getting into/out of the bath   4. Walking between rooms   5. Putting on socks/shoes   6. Squatting    7. Lifting an object, like a bag  of groceries from the floor   8. Performing light activities around your home   9. Performing heavy activities around your home   10. Getting into/out of a car   11. Walking 2 blocks   12. Walking 1 mile   13. Going up/down 10 stairs (1 flight)   14. Standing for 1 hour   15.  sitting for 1 hour   16. Running on even ground   17. Running on uneven ground   18. Making sharp turns while running fast   19. Hopping    20. Rolling over in bed   Score total:  13/80; 16.3%     12/19/23: LEFS 25/80 31.3%  COGNITION: Overall cognitive status: Within functional limits for tasks assessed     SENSATION: WFL  EDEMA:  Yes right hip normal for this time s/p  PALPATION: Swelling right hip   LOWER EXTREMITY MMT:  MMT Right eval Left eval Right 12/19/23 Left 12/19/23  Hip flexion N/t  3- 5  Hip extension      Hip abduction      Hip adduction      Hip internal rotation      Hip external rotation      Knee flexion      Knee extension 3- 5 4-* 5  Ankle dorsiflexion 5 5 5 5   Ankle plantarflexion      Ankle inversion      Ankle eversion       (Blank rows = not tested)   FUNCTIONAL TESTS:  5 times sit to stand: 19.16 sec Timed up and go (TUG): 21.79 sec with RW  12/19/23: TUG 14.36 sec with RW 5 time sit to stand 14.28 sec  GAIT: Distance walked: 60 ft in clinic Assistive device utilized: Walker - 2 wheeled Level of assistance: SBA Comments: antalgic gait decreased stance right lower extremity                                                                                                                                TREATMENT DATE:  01/09/24:  Treadmill grade 2, speed at 1.2 x 5 min no AD 210ft minimal antalgic gait mechanics Rockerboard x R/L minimal UE support Upon standing pt unable to fully extend Rt knee, discussed possible leg length discrepancy Attempted vector stance, unable to complete  without Lt UE support Toe tapping on 6in step no HHA x  20, cueign to reduce trunk sidebend with Rt LE weight bearing Lateral tapping between 6 and 8in step height 20x no HHA Split stance squat with mirror for feedback to equalize weight bearing, chain tapping x 10x  Supine: Leg length discrempany Rt femur from ASIS to medial knee 19.5in Lt femus from ASIS to medial knee 20in Encouraged to begin at 1/8-->1/4in heel support for Lt foot                                   01/01/24 EXERCISE LOG  Exercise Repetitions and Resistance Comments  Recumbent bike  L3 x 5 minutes  Full revolutions   Rocker board  2 minutes  AP; Infrequent UE support from parallel bars   Tandem stance on foam   3 x 30 seconds each  Intermittent UE support from parallel bars   Sit to stand   20 reps  15 reps w/ tidal tank at chest   Stepping over hurdles  2 laps each  Leading with each LE; 2 low hurdles and 1 high hurdle; BUE support from parallel bars   Side stepping over hurdles  3 laps in parallel bars  BUE support from parallel bars; 2 low hurdles and 1 high hurdle  Stepping strategy  25 reps each     Gait training  For heel-toe pattern  Without SPC in parallel bars; with SPC without parallel bars    Blank cell = exercise not performed today   12/27/23: Bike, seat 6, 5', full revolutions STS from chair height, 2x12 Forward foot taps to 6 inch step, 2x10, UE support Lateral step over, 4 inch step, 2x10 Side Stepping in // bars, RTB around knees, down and back 10x Hip vectors, RTB around knees, 10x each  Marching, RTB around knees, 12x each    12/23/23 Nustep seat 6 lvl 4 x 5' dynamic warm up Standing: in // with bil UE assist Heel/toe raises 20X each on incline/decline Vectors bil 2 sets 5 reps with 3 holds, bil UE assist Forward lunges onto 6 step no UE 2X10 Each side Rt toe tap onto 6 step 2X10 4 box fwd step ups Rt leading  x 10 with only 1 UE assist (took increased time) Rockerboard balancing without UE X 2 minutes  12/19/23 Nustep seat 6 lvl 2 x 5'  dynamic warm up Progress note LEFS 25/80 31.3% TUG 14.36 sec with RW 5 time sit to stand 14.28 sec MMT's see above Seated right hip flexion and abduction and back over 6 block to simulate getting leg in and out of car 2 x 5 Standing toe raises no UE assist for quad/hamstring co contract 2 x 15 Stagger stance and rock 2 x 10 Side stepping in // bars down and back x 5    PATIENT EDUCATION:  Education details: Patient educated on exam findings, POC, scope of PT, HEP, and what to expect next visit; review of hip precautions. Person educated: Patient Education method: Explanation, Demonstration, and Handouts Education comprehension: verbalized understanding, returned demonstration, verbal cues required, and tactile cues required   HOME EXERCISE PROGRAM: Access Code: [email or web address] URL: [email or web address] Date: 11/14/2023 Prepared by: [facility] - Rehab  Exercises - Seated Long Arc Quad  - 2 x daily - 7 x weekly - 2 sets - 10 reps - Seated Heel Toe Raises  - 2 x daily -  7 x weekly - 2 sets - 10 reps - Sit to Stand with Armchair  - 2 x daily - 7 x weekly - 1 sets - 5 reps - Heel Raises with Counter Support  - 2 x daily - 7 x weekly - 2 sets - 10 reps - Standing March with Counter Support  - 2 x daily - 7 x weekly - 2 sets - 10 reps - Standing Knee Flexion with Counter Support  - 2 x daily - 7 x weekly - 2 sets - 10 reps - Supine Quad Set  - 2 x daily - 7 x weekly - 1 sets - 10 reps - 5 sec hold - Hooklying Gluteal Sets  - 1 x daily - 7 x weekly - 1 sets - 10 reps - 5 sec hold  ASSESSMENT:  CLINICAL IMPRESSION: Session focus with gait training to address antalgic gait mechanics and proximal strengthening.  Noted when standing pt unable to fully extend Rt knee, discussed possible leg length discrepency that can alter gait mechanics and cause knee pain.  Measured leg length with noted Lt femur shorter than Rt.  Encouraged to start off at 1/4in heel support under Lt heel, may benefit  from lifted shoe.  Began session on treadmill with good gait mechanics with UE support, complete without AD with antalgic gait mechanics noted.  Gluteal strengthening exercises and balance training complete with minimal to no UE support needed.  No reports of pain through session.      Eval:Patient is a 28 y.o. female who was seen today for physical therapy evaluation and treatment for s/p right THA after a fall and hip fracture; Patient demonstrates muscle weakness, reduced ROM, and fascial restrictions which are likely contributing to symptoms of pain and are negatively impacting patient ability to perform ADLs and functional mobility tasks. Patient will benefit from skilled physical therapy services to address these deficits to reduce pain and improve level of function with ADLs and functional mobility tasks.   OBJECTIVE IMPAIRMENTS: Abnormal gait, decreased activity tolerance, decreased balance, difficulty walking, decreased strength, increased edema, increased fascial restrictions, impaired perceived functional ability, and pain.   ACTIVITY LIMITATIONS: carrying, lifting, bending, sitting, standing, squatting, sleeping, stairs, transfers, bed mobility, bathing, toileting, dressing, and locomotion level  PARTICIPATION LIMITATIONS: meal prep, cleaning, laundry, driving, shopping, and community activity  REHAB POTENTIAL: Good  CLINICAL DECISION MAKING: Evolving/moderate complexity  EVALUATION COMPLEXITY: Moderate   GOALS: Goals reviewed with patient? No  SHORT TERM GOALS: Target date: 12/06/2023 patient will be independent with initial HEP  Baseline: Goal status: met  2.  Patient will report 50% improvement overall  Baseline:  Goal status: in progress   3.  Patient will be able to state 3/3 hip precautions independently; (no hip flexion past 90, no crossing legs; no IR of hip) Baseline:  Goal status: anterior approach so not applicable   LONG TERM GOALS: Target date:  01/30/2024  Patient will be independent in self management strategies to improve quality of life and functional outcomes.  Baseline:  Goal status: in progress  2.  Patient will report 70% improvement overall  Baseline:  Goal status: in progress  3.  Patient will improve LEFS score by 30  points to demonstrate improved perceived function  Baseline: 13/80; 12/19/23 25/80 (12 point improvement) Goal status: in progress  4.  Patient will improve TUG score to 12 sec or less to demonstrate decreased fall risk and improved functional mobility Baseline: 21.79 sec; 14.36 sec 12/19/23 Goal  status: in progress   5.  Patient will improve 5 times sit to stand score to 12 sec or less to demonstrate improved functional mobility and increased leg strength.    Baseline: 19.16 sec; 14.28 sec on 12/19/23 Goal status: in progress    PLAN:  PT FREQUENCY: 2x/week  PT DURATION: 4 weeks  PLANNED INTERVENTIONS: 97164- PT Re-evaluation, 97110-Therapeutic exercises, 97530- Therapeutic activity, 97112- Neuromuscular re-education, 97535- Self Care, [id number]- Manual therapy, [phone]- Gait training, [phone]- Orthotic Fit/training, [phone]- Canalith repositioning, [id number]- Aquatic Therapy, [phone]- Splinting, [phone]- Wound care (first 20 sq cm), 97598- Wound care (each additional 20 sq cm)Patient/Family education, Balance training, Stair training, Taping, Dry Needling, Joint mobilization, Joint manipulation, Spinal manipulation, Spinal mobilization, Scar mobilization, and DME instructions.   PLAN FOR NEXT SESSION:  no hip precautions per MD.  Continue to progress Rt LE strengthening; gait training, balance; stairs.  continue with gait LRAD.  Next session begin with treadmill. Reassess in 2 more visits.  Augustin Mclean, LPTA/CLT; CBIS [phone]  12:47 PM, 01/09/24

## 2024-01-15 ENCOUNTER — Ambulatory Visit (HOSPITAL_COMMUNITY): Admitting: Physical Therapy

## 2024-01-15 DIAGNOSIS — Z96641 Presence of right artificial hip joint: Secondary | ICD-10-CM

## 2024-01-15 DIAGNOSIS — R262 Difficulty in walking, not elsewhere classified: Secondary | ICD-10-CM

## 2024-01-15 DIAGNOSIS — M25551 Pain in right hip: Secondary | ICD-10-CM

## 2024-01-15 NOTE — Therapy (Signed)
 OUTPATIENT PHYSICAL THERAPY LOWER EXTREMITY TREATMENT Discharge  PHYSICAL THERAPY DISCHARGE SUMMARY  Visits from Start of Care: 15  Current functional level related to goals / functional outcomes: See below   Remaining deficits: See below   Education / Equipment: HEP   Patient agrees to discharge. Patient goals were met. Patient is being discharged due to meeting the stated rehab goals.    Patient Name: Jacqueline Koch MRN: [id number] DOB:[birth year], 28 y.o., female Today's Date: 01/15/2024  END OF SESSION:  PT End of Session - 01/15/24 1026     Visit Number 15    Number of Visits 20    Date for Recertification  01/16/24    Authorization Type [place] Medicaid [employer]    Authorization Time Period 4 visits approved 12/27/23-01/25/24    Authorization - Visit Number 3    Authorization - Number of Visits 4    Progress Note Due on Visit 20    PT Start Time 0820    PT Stop Time 0850    PT Time Calculation (min) 30 min    Activity Tolerance Patient tolerated treatment well    Behavior During Therapy [facility] for tasks assessed/performed                Past Medical History:  Diagnosis Date   Chlamydia infection 01/20/2013   Closed nondisplaced fracture of shaft of left clavicle 03/30/2020   Kidney stone    Nexplanon  insertion 08/22/2015   08/22/2015       12/02/18 removal & reinsertion     Nexplanon  removal 06/09/2014   Nondisplaced fracture of body of left scapula with routine healing 03/30/2020   Syncope    Past Surgical History:  Procedure Laterality Date   TOTAL HIP ARTHROPLASTY Right 11/03/2023   Procedure: ARTHROPLASTY, HIP, TOTAL, ANTERIOR APPROACH VS POSSIBLE SCREW FIXATION;  Surgeon: Kendal Franky SQUIBB, MD;  Location: [facility] OR;  Service: Orthopedics;  Laterality: Right;   Patient Active Problem List   Diagnosis Date Noted   Acute postoperative anemia due to expected blood loss 11/04/2023   S/P total right hip arthroplasty - 11-04-2023 by Dr. Kendal 11/03/2023    Closed right hip fracture (HCC) 11/01/2023   Marijuana use 12/08/2014   Hearing deficit 12/01/2012   Scoliosis 11/02/2010    PCP: no PCP  REFERRING PROVIDER: Kendal Franky SQUIBB, MD  REFERRING DIAG: s/p Right THA  THERAPY DIAG:  S/P total right hip arthroplasty  Pain in right hip  Difficulty in walking, not elsewhere classified  Rationale for Evaluation and Treatment: Rehabilitation  ONSET DATE: 11/03/23  SUBJECTIVE:   SUBJECTIVE STATEMENT: Pt reports she is 75% improved.  Only having pain in her Rt knee with prolonged walking, however it has improved as well.   Pain gets as high now as 2/10 but was getting as high as 9/10.  Did go to [employer] and [place] a [place] which increased her pain that night and the next day.     Eval:Fell Friday 9/27 in [employer]; slipped in water; almost did a split; was unable to get up; went via ambulance to [facility] then to [facility]; surgery done 9/28 discharged home on Monday.  PERTINENT HISTORY: Here previously for therapy prior to fall PAIN:  Are you having pain? Yes: NPRS scale: 0/10 Pain location: no pain right hip; knee 3/10 Pain description: sore/stiff Aggravating factors: just sitting prolonged Relieving factors: elevating right leg; ice packs; pain medication  PRECAUTIONS: Anterior hip From Dr Kendal through  staff messenger 12/17/23:  good morning, I just saw this patient and she's doing well. She has no restrictions from my standpoint. She never had any hip precautions since we went anterior. Reach out with any other questions    WEIGHT BEARING RESTRICTIONS: No  FALLS:  Has patient fallen in last 6 months? Yes. Number of falls 1   OCCUPATION:   PLOF: Independent  PATIENT GOALS: walk without the walker  NEXT MD VISIT: next Tuesday 10/14  OBJECTIVE:  Note: Objective measures were completed at Evaluation unless otherwise noted.  DIAGNOSTIC FINDINGS:   PATIENT SURVEYS:  LEFS  Extreme  difficulty/unable (0), Quite a bit of difficulty (1), Moderate difficulty (2), Little difficulty (3), No difficulty (4) Survey date:  eval 12/19/23 01/15/24  Any of your usual work, housework or school activities     2. Usual hobbies, recreational or sporting activities     3. Getting into/out of the bath     4. Walking between rooms     5. Putting on socks/shoes     6. Squatting      7. Lifting an object, like a bag of groceries from the floor     8. Performing light activities around your home     9. Performing heavy activities around your home     10. Getting into/out of a car     11. Walking 2 blocks     12. Walking 1 mile     13. Going up/down 10 stairs (1 flight)     14. Standing for 1 hour     15.  sitting for 1 hour     16. Running on even ground     17. Running on uneven ground     18. Making sharp turns while running fast     19. Hopping      20. Rolling over in bed     Score total:  13/80; 16.3% 25/80 31.3%  55 / 80 = 68.8 %     12/19/23: LEFS 25/80 31.3%  COGNITION: Overall cognitive status: Within functional limits for tasks assessed     SENSATION: WFL  EDEMA:  Yes right hip normal for this time s/p  PALPATION: Swelling right hip   LOWER EXTREMITY MMT:  MMT Right eval Left eval Right 12/19/23 Left 12/19/23  Hip flexion N/t  3- 5  Hip extension      Hip abduction      Hip adduction      Hip internal rotation      Hip external rotation      Knee flexion      Knee extension 3- 5 4-* 5  Ankle dorsiflexion 5 5 5 5   Ankle plantarflexion      Ankle inversion      Ankle eversion       (Blank rows = not tested)   FUNCTIONAL TESTS:  5 times sit to stand: 19.16 sec Timed up and go (TUG): 21.79 sec with RW  12/19/23: TUG 14.36 sec with RW 5 time sit to stand 14.28 sec   01/15/24: TUG 9.63 no AD 5 time sit to stand 11.48 sec  GAIT: Distance walked: 60 ft in clinic Assistive device utilized: Walker - 2 wheeled Level of assistance:  SBA Comments: antalgic gait decreased stance right lower extremity  TREATMENT DATE:  01/15/24 Progress Note:  TUG 9.63 no AD 5 time sit to stand 11.48 sec Treadmill 2% grade, bil UE support 1. X 5 minutes working on even stride  55 / 80 = 68.8 % LEFS  Goal review Work on gait, discharge instructions  01/09/24:  Treadmill grade 2, speed at 1.2 x 5 min no AD 285ft minimal antalgic gait mechanics Rockerboard x R/L minimal UE support Upon standing pt unable to fully extend Rt knee, discussed possible leg length discrepancy Attempted vector stance, unable to complete without Lt UE support Toe tapping on 6in step no HHA x 20, cueign to reduce trunk sidebend with Rt LE weight bearing Lateral tapping between 6 and 8in step height 20x no HHA Split stance squat with mirror for feedback to equalize weight bearing, chain tapping x 10x  Supine: Leg length discrempany Rt femur from ASIS to medial knee 19.5in Lt femus from ASIS to medial knee 20in Encouraged to begin at 1/8-->1/4in heel support for Lt foot                                   01/01/24 EXERCISE LOG  Exercise Repetitions and Resistance Comments  Recumbent bike  L3 x 5 minutes  Full revolutions   Rocker board  2 minutes  AP; Infrequent UE support from parallel bars   Tandem stance on foam   3 x 30 seconds each  Intermittent UE support from parallel bars   Sit to stand   20 reps  15 reps w/ tidal tank at chest   Stepping over hurdles  2 laps each  Leading with each LE; 2 low hurdles and 1 high hurdle; BUE support from parallel bars   Side stepping over hurdles  3 laps in parallel bars  BUE support from parallel bars; 2 low hurdles and 1 high hurdle  Stepping strategy  25 reps each     Gait training  For heel-toe pattern  Without SPC in parallel bars; with SPC without parallel bars    Blank  cell = exercise not performed today   12/27/23: Bike, seat 6, 5', full revolutions STS from chair height, 2x12 Forward foot taps to 6 inch step, 2x10, UE support Lateral step over, 4 inch step, 2x10 Side Stepping in // bars, RTB around knees, down and back 10x Hip vectors, RTB around knees, 10x each  Marching, RTB around knees, 12x each    12/23/23 Nustep seat 6 lvl 4 x 5' dynamic warm up Standing: in // with bil UE assist Heel/toe raises 20X each on incline/decline Vectors bil 2 sets 5 reps with 3 holds, bil UE assist Forward lunges onto 6 step no UE 2X10 Each side Rt toe tap onto 6 step 2X10 4 box fwd step ups Rt leading  x 10 with only 1 UE assist (took increased time) Rockerboard balancing without UE X 2 minutes  12/19/23 Nustep seat 6 lvl 2 x 5' dynamic warm up Progress note LEFS 25/80 31.3% TUG 14.36 sec with RW 5 time sit to stand 14.28 sec MMT's see above Seated right hip flexion and abduction and back over 6 block to simulate getting leg in and out of car 2 x 5 Standing toe raises no UE assist for quad/hamstring co contract 2 x 15 Stagger stance and rock 2 x 10 Side stepping in // bars down and back x 5    PATIENT EDUCATION:  Education details: Patient educated on exam findings, POC, scope of PT, HEP, and what to expect next visit; review of hip precautions. Person educated: Patient Education method: Explanation, Demonstration, and Handouts Education comprehension: verbalized understanding, returned demonstration, verbal cues required, and tactile cues required   HOME EXERCISE PROGRAM: Access Code: [email or web address] URL: [email or web address] Date: 11/14/2023 Prepared by: [facility] - Rehab  Exercises - Seated Long Arc Quad  - 2 x daily - 7 x weekly - 2 sets - 10 reps - Seated Heel Toe Raises  - 2 x daily - 7 x weekly - 2 sets - 10 reps - Sit to Stand with Armchair  - 2 x daily - 7 x weekly - 1 sets - 5 reps - Heel Raises with Counter Support  - 2 x  daily - 7 x weekly - 2 sets - 10 reps - Standing March with Counter Support  - 2 x daily - 7 x weekly - 2 sets - 10 reps - Standing Knee Flexion with Counter Support  - 2 x daily - 7 x weekly - 2 sets - 10 reps - Supine Quad Set  - 2 x daily - 7 x weekly - 1 sets - 10 reps - 5 sec hold - Hooklying Gluteal Sets  - 1 x daily - 7 x weekly - 1 sets - 10 reps - 5 sec hold  ASSESSMENT:  CLINICAL IMPRESSION: Began session with functional test measures to complete reassessment.  Pt has ordered her lift for her shoe and is to arrive Monday.  Educated to discuss with MD if this does not help as she may benefit from an orthotic referral. Worked on reducing antalgia and improving her stride.  Pt able to improve with focus and while completing gait on treadmill.  Encouraged to increase her walking at home and to continue glute strengthening exerices for her Rt hip .  Pt has met all goals set at evaluation and is now ready for discharge.  Pt plans on continuing her HEP and beginning to work out at a local gym to further improve her gait.      Eval:Patient is a 28 y.o. female who was seen today for physical therapy evaluation and treatment for s/p right THA after a fall and hip fracture; Patient demonstrates muscle weakness, reduced ROM, and fascial restrictions which are likely contributing to symptoms of pain and are negatively impacting patient ability to perform ADLs and functional mobility tasks. Patient will benefit from skilled physical therapy services to address these deficits to reduce pain and improve level of function with ADLs and functional mobility tasks.   OBJECTIVE IMPAIRMENTS: Abnormal gait, decreased activity tolerance, decreased balance, difficulty walking, decreased strength, increased edema, increased fascial restrictions, impaired perceived functional ability, and pain.   ACTIVITY LIMITATIONS: carrying, lifting, bending, sitting, standing, squatting, sleeping, stairs, transfers, bed  mobility, bathing, toileting, dressing, and locomotion level  PARTICIPATION LIMITATIONS: meal prep, cleaning, laundry, driving, shopping, and community activity  REHAB POTENTIAL: Good  CLINICAL DECISION MAKING: Evolving/moderate complexity  EVALUATION COMPLEXITY: Moderate   GOALS: Goals reviewed with patient? No  SHORT TERM GOALS: Target date: 12/06/2023 patient will be independent with initial HEP  Baseline: Goal status:MET  2.  Patient will report 50% improvement overall  Baseline:  Goal status: MET   LONG TERM GOALS: Target date: 01/30/2024  Patient will be independent in self management strategies to improve quality of life and functional outcomes.  Baseline:  Goal status: MET  2.  Patient will report 70% improvement overall  Baseline:  Goal status: MET  3.  Patient will improve LEFS score by 30  points to demonstrate improved perceived function  Baseline: 13/80; 12/19/23 25/80 (12 point improvement),  55 / 80 = 68.8 % 01/15/24 Goal status: MET  4.  Patient will improve TUG score to 12 sec or less to demonstrate decreased fall risk and improved functional mobility Baseline: 21.79 sec; 14.36 sec 12/19/23, 9.63 no AD 01/15/24 Goal status: MET  5.  Patient will improve 5 times sit to stand score to 12 sec or less to demonstrate improved functional mobility and increased leg strength.    Baseline: 19.16 sec; 14.28 sec on 12/19/23, 11.48 sec on 01/15/24 Goal status: MET    PLAN:  PT FREQUENCY: 2x/week  PT DURATION: 4 weeks  PLANNED INTERVENTIONS: 97164- PT Re-evaluation, 97110-Therapeutic exercises, 97530- Therapeutic activity, 97112- Neuromuscular re-education, 97535- Self Care, [id number]- Manual therapy, [phone]- Gait training, [phone]- Orthotic Fit/training, [phone]- Canalith repositioning, [id number]- Aquatic Therapy, [phone]- Splinting, [phone]- Wound care (first 20 sq cm), 97598- Wound care (each additional 20 sq cm)Patient/Family education, Balance training, Stair  training, Taping, Dry Needling, Joint mobilization, Joint manipulation, Spinal manipulation, Spinal mobilization, Scar mobilization, and DME instructions.   PLAN FOR NEXT SESSION: Discharge to HEP    Greig KATHEE Fuse, PTA/CLT [facility] Ph: [phone]  10:28 AM, 01/15/24

## 2024-01-17 ENCOUNTER — Ambulatory Visit (HOSPITAL_COMMUNITY)

## 2024-01-21 ENCOUNTER — Ambulatory Visit (HOSPITAL_COMMUNITY)

## 2024-01-23 ENCOUNTER — Ambulatory Visit (HOSPITAL_COMMUNITY)

## 2024-01-27 ENCOUNTER — Ambulatory Visit (HOSPITAL_COMMUNITY)

## 2024-01-28 ENCOUNTER — Ambulatory Visit (HOSPITAL_COMMUNITY)

## 2024-01-28 DIAGNOSIS — S72041D Displaced fracture of base of neck of right femur, subsequent encounter for closed fracture with routine healing: Secondary | ICD-10-CM | POA: Diagnosis not present
